# Patient Record
Sex: Female | Born: 1945
Health system: Southern US, Community
[De-identification: ages and names within clinical notes are randomized; demographics above are authoritative.]

## PROBLEM LIST (undated history)

## (undated) DIAGNOSIS — F419 Anxiety disorder, unspecified: Secondary | ICD-10-CM

## (undated) DIAGNOSIS — K589 Irritable bowel syndrome without diarrhea: Secondary | ICD-10-CM

## (undated) DIAGNOSIS — E669 Obesity, unspecified: Secondary | ICD-10-CM

## (undated) DIAGNOSIS — K5792 Diverticulitis of intestine, part unspecified, without perforation or abscess without bleeding: Secondary | ICD-10-CM

## (undated) DIAGNOSIS — R011 Cardiac murmur, unspecified: Secondary | ICD-10-CM

## (undated) DIAGNOSIS — I493 Ventricular premature depolarization: Secondary | ICD-10-CM

## (undated) DIAGNOSIS — E785 Hyperlipidemia, unspecified: Secondary | ICD-10-CM

## (undated) DIAGNOSIS — F329 Major depressive disorder, single episode, unspecified: Secondary | ICD-10-CM

## (undated) DIAGNOSIS — D649 Anemia, unspecified: Secondary | ICD-10-CM

## (undated) DIAGNOSIS — I8500 Esophageal varices without bleeding: Secondary | ICD-10-CM

## (undated) DIAGNOSIS — J189 Pneumonia, unspecified organism: Secondary | ICD-10-CM

## (undated) DIAGNOSIS — Z8669 Personal history of other diseases of the nervous system and sense organs: Secondary | ICD-10-CM

## (undated) DIAGNOSIS — K219 Gastro-esophageal reflux disease without esophagitis: Secondary | ICD-10-CM

## (undated) DIAGNOSIS — C519 Malignant neoplasm of vulva, unspecified: Secondary | ICD-10-CM

## (undated) DIAGNOSIS — H269 Unspecified cataract: Secondary | ICD-10-CM

## (undated) DIAGNOSIS — E119 Type 2 diabetes mellitus without complications: Secondary | ICD-10-CM

## (undated) DIAGNOSIS — K8020 Calculus of gallbladder without cholecystitis without obstruction: Secondary | ICD-10-CM

## (undated) DIAGNOSIS — K7581 Nonalcoholic steatohepatitis (NASH): Secondary | ICD-10-CM

## (undated) DIAGNOSIS — F32A Depression, unspecified: Secondary | ICD-10-CM

## (undated) DIAGNOSIS — I10 Essential (primary) hypertension: Secondary | ICD-10-CM

## (undated) DIAGNOSIS — N3010 Interstitial cystitis (chronic) without hematuria: Secondary | ICD-10-CM

## (undated) HISTORY — DX: Personal history of other diseases of the nervous system and sense organs: Z86.69

## (undated) HISTORY — DX: Obesity, unspecified: E66.9

## (undated) HISTORY — DX: Hyperlipidemia, unspecified: E78.5

## (undated) HISTORY — DX: Diverticulitis of intestine, part unspecified, without perforation or abscess without bleeding: K57.92

## (undated) HISTORY — PX: ABDOMINAL HYSTERECTOMY: SHX81

## (undated) HISTORY — DX: Esophageal varices without bleeding: I85.00

## (undated) HISTORY — DX: Cardiac murmur, unspecified: R01.1

## (undated) HISTORY — PX: CHOLECYSTECTOMY: SHX55

## (undated) HISTORY — DX: Malignant neoplasm of vulva, unspecified: C51.9

## (undated) HISTORY — DX: Pneumonia, unspecified organism: J18.9

## (undated) HISTORY — DX: Interstitial cystitis (chronic) without hematuria: N30.10

## (undated) HISTORY — PX: HERNIA REPAIR: SHX51

## (undated) HISTORY — DX: Ventricular premature depolarization: I49.3

## (undated) HISTORY — DX: Depression, unspecified: F32.A

## (undated) HISTORY — DX: Anxiety disorder, unspecified: F41.9

## (undated) HISTORY — PX: FOOT SURGERY: SHX648

## (undated) HISTORY — DX: Calculus of gallbladder without cholecystitis without obstruction: K80.20

## (undated) HISTORY — DX: Gastro-esophageal reflux disease without esophagitis: K21.9

## (undated) HISTORY — DX: Anemia, unspecified: D64.9

## (undated) HISTORY — DX: Unspecified cataract: H26.9

## (undated) HISTORY — PX: COLONOSCOPY: SHX174

## (undated) HISTORY — DX: Irritable bowel syndrome, unspecified: K58.9

---
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
## 2019-05-13 ENCOUNTER — Encounter (HOSPITAL_COMMUNITY): Payer: Self-pay | Admitting: Emergency Medicine

## 2019-05-13 ENCOUNTER — Observation Stay (HOSPITAL_COMMUNITY): Payer: Medicare Other

## 2019-05-13 ENCOUNTER — Emergency Department (HOSPITAL_COMMUNITY): Payer: Medicare Other

## 2019-05-13 ENCOUNTER — Inpatient Hospital Stay (HOSPITAL_COMMUNITY)
Admission: EM | Admit: 2019-05-13 | Discharge: 2019-05-15 | DRG: 442 | Disposition: A | Discharge status: Home Health | Payer: Medicare Other | Attending: Internal Medicine | Admitting: Internal Medicine

## 2019-05-13 ENCOUNTER — Other Ambulatory Visit: Payer: Self-pay

## 2019-05-13 DIAGNOSIS — Z87891 Personal history of nicotine dependence: Secondary | ICD-10-CM

## 2019-05-13 DIAGNOSIS — E860 Dehydration: Secondary | ICD-10-CM | POA: Diagnosis present

## 2019-05-13 DIAGNOSIS — Z882 Allergy status to sulfonamides status: Secondary | ICD-10-CM

## 2019-05-13 DIAGNOSIS — Z8249 Family history of ischemic heart disease and other diseases of the circulatory system: Secondary | ICD-10-CM

## 2019-05-13 DIAGNOSIS — Z880 Allergy status to penicillin: Secondary | ICD-10-CM

## 2019-05-13 DIAGNOSIS — E785 Hyperlipidemia, unspecified: Secondary | ICD-10-CM | POA: Diagnosis present

## 2019-05-13 DIAGNOSIS — E1151 Type 2 diabetes mellitus with diabetic peripheral angiopathy without gangrene: Secondary | ICD-10-CM | POA: Diagnosis present

## 2019-05-13 DIAGNOSIS — K7290 Hepatic failure, unspecified without coma: Principal | ICD-10-CM | POA: Diagnosis present

## 2019-05-13 DIAGNOSIS — Z20828 Contact with and (suspected) exposure to other viral communicable diseases: Secondary | ICD-10-CM | POA: Diagnosis present

## 2019-05-13 DIAGNOSIS — R531 Weakness: Secondary | ICD-10-CM

## 2019-05-13 DIAGNOSIS — K7581 Nonalcoholic steatohepatitis (NASH): Secondary | ICD-10-CM | POA: Diagnosis present

## 2019-05-13 DIAGNOSIS — E1122 Type 2 diabetes mellitus with diabetic chronic kidney disease: Secondary | ICD-10-CM | POA: Diagnosis present

## 2019-05-13 DIAGNOSIS — K7460 Unspecified cirrhosis of liver: Secondary | ICD-10-CM | POA: Diagnosis present

## 2019-05-13 DIAGNOSIS — Z886 Allergy status to analgesic agent status: Secondary | ICD-10-CM

## 2019-05-13 DIAGNOSIS — R011 Cardiac murmur, unspecified: Secondary | ICD-10-CM | POA: Diagnosis present

## 2019-05-13 DIAGNOSIS — K7682 Hepatic encephalopathy: Secondary | ICD-10-CM

## 2019-05-13 DIAGNOSIS — N179 Acute kidney failure, unspecified: Secondary | ICD-10-CM | POA: Diagnosis present

## 2019-05-13 DIAGNOSIS — K589 Irritable bowel syndrome without diarrhea: Secondary | ICD-10-CM | POA: Diagnosis present

## 2019-05-13 DIAGNOSIS — Z881 Allergy status to other antibiotic agents status: Secondary | ICD-10-CM

## 2019-05-13 DIAGNOSIS — I129 Hypertensive chronic kidney disease with stage 1 through stage 4 chronic kidney disease, or unspecified chronic kidney disease: Secondary | ICD-10-CM | POA: Diagnosis present

## 2019-05-13 DIAGNOSIS — Z833 Family history of diabetes mellitus: Secondary | ICD-10-CM

## 2019-05-13 HISTORY — DX: Type 2 diabetes mellitus without complications: E11.9

## 2019-05-13 HISTORY — DX: Nonalcoholic steatohepatitis (NASH): K75.81

## 2019-05-13 HISTORY — DX: Essential (primary) hypertension: I10

## 2019-05-13 LAB — CBC WITH DIFFERENTIAL/PLATELET
Abs Immature Granulocytes: 0.01 10*3/uL (ref 0.00–0.07)
Basophils Absolute: 0.1 10*3/uL (ref 0.0–0.1)
Basophils Relative: 1 %
Eosinophils Absolute: 0.3 10*3/uL (ref 0.0–0.5)
Eosinophils Relative: 6 %
HCT: 35.3 % — ABNORMAL LOW (ref 36.0–46.0)
Hemoglobin: 11.3 g/dL — ABNORMAL LOW (ref 12.0–15.0)
Immature Granulocytes: 0 %
Lymphocytes Relative: 24 %
Lymphs Abs: 1.1 10*3/uL (ref 0.7–4.0)
MCH: 29 pg (ref 26.0–34.0)
MCHC: 32 g/dL (ref 30.0–36.0)
MCV: 90.7 fL (ref 80.0–100.0)
Monocytes Absolute: 0.8 10*3/uL (ref 0.1–1.0)
Monocytes Relative: 18 %
Neutro Abs: 2.3 10*3/uL (ref 1.7–7.7)
Neutrophils Relative %: 51 %
Platelets: 122 10*3/uL — ABNORMAL LOW (ref 150–400)
RBC: 3.89 MIL/uL (ref 3.87–5.11)
RDW: 17.8 % — ABNORMAL HIGH (ref 11.5–15.5)
WBC: 4.5 10*3/uL (ref 4.0–10.5)
nRBC: 0 % (ref 0.0–0.2)

## 2019-05-13 LAB — URINALYSIS, ROUTINE W REFLEX MICROSCOPIC
Bilirubin Urine: NEGATIVE
Glucose, UA: NEGATIVE mg/dL
Hgb urine dipstick: NEGATIVE
Ketones, ur: NEGATIVE mg/dL
Nitrite: NEGATIVE
Protein, ur: NEGATIVE mg/dL
Specific Gravity, Urine: 1.009 (ref 1.005–1.030)
pH: 6 (ref 5.0–8.0)

## 2019-05-13 LAB — COMPREHENSIVE METABOLIC PANEL
ALT: 33 U/L (ref 0–44)
AST: 58 U/L — ABNORMAL HIGH (ref 15–41)
Albumin: 3.6 g/dL (ref 3.5–5.0)
Alkaline Phosphatase: 102 U/L (ref 38–126)
Anion gap: 13 (ref 5–15)
BUN: 45 mg/dL — ABNORMAL HIGH (ref 8–23)
CO2: 24 mmol/L (ref 22–32)
Calcium: 9.8 mg/dL (ref 8.9–10.3)
Chloride: 104 mmol/L (ref 98–111)
Creatinine, Ser: 1.59 mg/dL — ABNORMAL HIGH (ref 0.44–1.00)
GFR calc Af Amer: 37 mL/min — ABNORMAL LOW (ref >60)
GFR calc non Af Amer: 32 mL/min — ABNORMAL LOW (ref >60)
Glucose, Bld: 112 mg/dL — ABNORMAL HIGH (ref 70–99)
Potassium: 3 mmol/L — ABNORMAL LOW (ref 3.5–5.1)
Sodium: 141 mmol/L (ref 135–145)
Total Bilirubin: 0.8 mg/dL (ref 0.3–1.2)
Total Protein: 7.9 g/dL (ref 6.5–8.1)

## 2019-05-13 LAB — PROTIME-INR
INR: 1.1 (ref 0.8–1.2)
Prothrombin Time: 14.1 seconds (ref 11.4–15.2)

## 2019-05-13 LAB — CBG MONITORING, ED
Glucose-Capillary: 186 mg/dL — ABNORMAL HIGH (ref 70–99)
Glucose-Capillary: 103 mg/dL — ABNORMAL HIGH (ref 70–99)

## 2019-05-13 LAB — GLUCOSE, CAPILLARY
Glucose-Capillary: 194 mg/dL — ABNORMAL HIGH (ref 70–99)
Glucose-Capillary: 111 mg/dL — ABNORMAL HIGH (ref 70–99)

## 2019-05-13 LAB — SARS CORONAVIRUS 2 BY RT PCR (HOSPITAL ORDER, PERFORMED IN ~~LOC~~ HOSPITAL LAB): SARS Coronavirus 2: NEGATIVE

## 2019-05-13 LAB — AMMONIA: Ammonia: 141 umol/L — ABNORMAL HIGH (ref 9–35)

## 2019-05-13 MED ORDER — LACTULOSE 10 GM/15ML PO SOLN
30.0000 g | Freq: Once | ORAL | Status: DC
  Filled 2019-05-13: qty 45

## 2019-05-13 MED ORDER — CLOBETASOL PROPIONATE 0.05 % EX CREA
TOPICAL_CREAM | Freq: Two times a day (BID) | CUTANEOUS | Status: DC
  Administered 2019-05-13: 23:00:00 via TOPICAL
  Administered 2019-05-14: 1 via TOPICAL
  Administered 2019-05-14 – 2019-05-15 (×2): via TOPICAL
  Filled 2019-05-13: qty 15

## 2019-05-13 MED ORDER — INSULIN ASPART 100 UNIT/ML ~~LOC~~ SOLN: 0.0000 [IU] | Freq: Every day | SUBCUTANEOUS | Status: DC

## 2019-05-13 MED ORDER — LACTULOSE 10 GM/15ML PO SOLN
20.0000 g | Freq: Once | ORAL | Status: AC
  Administered 2019-05-13: 20 g via ORAL
  Filled 2019-05-13 (×2): qty 30

## 2019-05-13 MED ORDER — POTASSIUM CHLORIDE CRYS ER 20 MEQ PO TBCR
40.0000 meq | EXTENDED_RELEASE_TABLET | Freq: Once | ORAL | Status: AC
  Administered 2019-05-13: 40 meq via ORAL
  Filled 2019-05-13: qty 2

## 2019-05-13 MED ORDER — SODIUM CHLORIDE 0.9% FLUSH
3.0000 mL | Freq: Two times a day (BID) | INTRAVENOUS | Status: DC
  Administered 2019-05-13 – 2019-05-15 (×4): 3 mL via INTRAVENOUS

## 2019-05-13 MED ORDER — ENOXAPARIN SODIUM 40 MG/0.4ML ~~LOC~~ SOLN
40.0000 mg | SUBCUTANEOUS | Status: DC
  Administered 2019-05-13 – 2019-05-14 (×2): 40 mg via SUBCUTANEOUS
  Filled 2019-05-13 (×2): qty 0.4

## 2019-05-13 MED ORDER — INSULIN ASPART 100 UNIT/ML ~~LOC~~ SOLN
0.0000 [IU] | Freq: Three times a day (TID) | SUBCUTANEOUS | Status: DC
  Administered 2019-05-13: 3 [IU] via SUBCUTANEOUS
  Administered 2019-05-14: 8 [IU] via SUBCUTANEOUS
  Administered 2019-05-15: 12:00:00 3 [IU] via SUBCUTANEOUS

## 2019-05-13 MED ORDER — SODIUM CHLORIDE 0.9 % IV BOLUS
1000.0000 mL | Freq: Once | INTRAVENOUS | Status: AC
  Administered 2019-05-13: 1000 mL via INTRAVENOUS

## 2019-05-13 NOTE — ED Notes (Signed)
Patient transported to [facility] 

## 2019-05-13 NOTE — ED Triage Notes (Signed)
Pt c/o unable to get out of bed this am, "legs wouldn't work right" states has been having shaky episodes and thinks that her blood sugar is low,  CBG on arrival = 186, also c/o having a difficult time with thoughts and memory--

## 2019-05-13 NOTE — H&P (Signed)
Date: 05/13/2019               Patient Name:  Sheila [name] MRN: [id number]  DOB: 29-Apr-1946 Age / Sex: 73 y.o., female   PCP: Chesley Noon, MD         Medical Service: Internal Medicine Teaching Service         Attending Physician: Dr. Sid Falcon, MD    First Contact: Dr. Madilyn Fireman  Pager: [phone]  Second Contact: Dr. Sherry Ruffing Pager: [phone]       After Hours (After 5p/  First Contact Pager: [phone]  weekends / holidays): Second Contact Pager: [phone]   Chief Complaint: " Cannot get out of bed"  History of Present Illness:  Sheila [name] is a 73 year old female with a medical history of NASH cirrhosis diagnosed by biopsy in 2006, HTN, HLD, T2DM, and obesity who presented to the ED for evaluation of diffuse weakness. She had been doing overall well until yesterday when she had an episode of dizziness while she was driving home.  She describes this as feeling woozy and is worse when standing up.  Denies room spinning or feeling of passing out or imbalance.  No auditory symptoms at the time.  She thought she was hypoglycemic and checked her BG when she arrived home which was 89. Does not check her BG regularly is not sure if this is normal for her. She takes PO medications for DM, no insulin use.  Reports her a last A1c was 5.9.  This morning she felt very weak when she woke up and was not able to get out of bed by herself.  She also reports feeling foggy and having difficulty thinking.  She denies addition of new medications recently but did start taking lasix 40 mg QD 1.5 weeks ago for ankle swelling that is thought to be secondary to amlodipine use.This prescribed as a PRN. She denies headaches, changes in vision, fever, chills, chest pain, shortness of breath, cough, abdominal pain, nausea and vomiting, urinary symptoms.  She does have a history of IBS and usually has diarrhea.  States this has been well controlled recently and she is only been having 1 BM per day.  ED course:  Patient was afebrile and hemodynamically stable on arrival to the ED.  Blood work was remarkable for Hgb 11.3, platelets 122, Na 141, K3.0, BUN 112, Cr 1.59 (baseline 1.1-1.3), AST 58, ALT 33, alk phos 102, TB 0.8, INR 1.1.  COVID pending.  Chest x-ray did not reveal any acute abnormalities.  CT head showed mild to moderate for age cerebral white matter hypodensity most commonly due to small vessel disease, but no acute intracranial normalities.   Meds: This medication list was obtained from the care everywhere tab and has not been verified by pharmacy yet. Abilify 10 mg nightly Remodel 0.5-0.025 mg tablets 4 times daily Zetia 10 mg daily Lasix 20 mg daily Gabapentin 200 daily Omeprazole 40 daily Janumet [phone] mg daily Amlodipine 10 mg daily Prozac 40 mg daily Metoprolol 25 mg daily Telmisartan-HCTZ 80-25 mg daily   Allergies: Allergies as of 05/13/2019 - Review Complete 05/13/2019  Allergen Reaction Noted  . Acetaminophen  09/23/2013  . Cefazolin Other (See Comments) 01/14/2015  . Ciprofloxacin hcl Nausea Only 09/28/2010  . Ibuprofen  09/23/2013  . Sulfa antibiotics  01/06/2014  . Naproxen sodium  12/28/2010  . Amoxicillin Diarrhea 10/12/2014   Past Medical History:  Diagnosis Date  . Diabetes mellitus without complication ([place])   .  Hypertension   . NASH (nonalcoholic steatohepatitis)     Family History:  HTN and DM- mother and father   Social History: Patient recently moved to [place] from [place].  She lives with her husband of 12 years.  Able to complete ADLs at home.  She denies alcohol and illicit substance use.  She is a former smoker with a 15-pack-year history.  Quit smoking 12 years ago.  Review of Systems: A complete ROS was negative except as per HPI.   Physical Exam: Blood pressure (!) 123/55, pulse (!) 57, temperature 98.7 F (37.1 C), temperature source Oral, resp. rate 18, height '5\' 6"'$  (1.676 m), weight 79.8 kg, SpO2 95 %.  General: Well-appearing  female in no acute distress HENT: NCAT, neck supple and FROM, OP clear without exudates or erythema, dry mucous membranes Eyes: anicteric sclera, PERRL Cardiac: regular rate and rhythm, nl F0/Y7, II/VI systolic murmur best heard on right sternal border, no JVD Pulm: CTAB, no wheezes or crackles, no increased work of breathing  Abd: soft, NTND, hyperactive bowel sounds Neuro: A&Ox3, CN II-XII intact, sensation intact in all four extremities, no motor deficits, nl finger-to-nose testing, nl heel-to shin  Ext: warm and well perfused with 1+ pitting edema bilaterally, asterixis present  Derm: Maculopapular erythematous rash with telangiectasias over bilateral arms, chest, back, and neck Psych: attentive, appropriate affect, answers questions appropriately    EKG: personally reviewed my interpretation is normal sinus rhythm, Q waves on leads III, aVF, and V4-V6. TWI on lead III.   CXR: personally reviewed my interpretation is no opacities or effusions   Assessment & Plan by Problem: Active Problems:   Weakness   # Weakness: Patient's presents with a variety of symptoms including dizziness, slow thought process, and diffuse weakness that I suspect are multifactorial in nature. Mild hepatic encephalopathy with slow thought process, asterixis, and high ammonia level.  She has also been using Lasix on a daily basis for the past 1.5 weeks and appears dehydrated on exam. In addition, she could also be experiencing relative hypoglycemia, though unclear where her CBGs are at baseline as she does not check them at home. CVA has been ruled out and low suspicion for infection as she is afebrile, without leukocytosis and without infectious signs or symtpoms. - Orthostatic VS followed by 1L NS bolus  - Lactulose 20 mg once  - PT/OT   # NASH cirrhosis: Diagnosed by biopsy on 2006 per patient report. Last RUq [place] was in 2016 which showed mildly nodular liver. Not on any medications for cirrhosis.  She does not  follow-up with GI.  She denies previous history of HE or SBP.  - RUQ [place]  - Hold Lasix in the setting of dehydration  - Will need outpatient referral to GI  # AKI: Baseline renal function appears to be Cr 1.1-1.3. Currently at 1.6. I suspect this is pre-renal due to dehydration as she appears hypovolemic on exam.  Will give 1 L NS bolus.  BMP tomorrow AM.   # IBS: Will hold Lomotil for now.   # HTN: Holding all antihypertensive medications. # HLD: Continue home Zetia 10 mg QD.   # T2DM: On Janumet at home.  Will hold and place on moderate sliding scale with at bedtime scale. Consider discontinuing metformin on discharge due to history of cirrhosis.    Diet: Low Na IVF: 1L NS  VTE ppx: SQ enoxaparin  Code status: FULL code, confirmed on admission   Dispo: Admit patient to Observation with expected  length of stay less than 2 midnights.  SignedWelford Roche, MD 05/13/2019, 11:47 AM  Pager: [phone]

## 2019-05-13 NOTE — Progress Notes (Signed)
Sheila [name] [id number] Admission Data: 05/13/2019 4:21 PM Attending Provider: Sid Falcon, MD  [id number], Rebeca Alert, MD Consults/ Treatment Team:   Sheila [name] is a 73 y.o. female patient admitted from ED awake, alert  & orientated  X 3,  Full Code, VSS - Blood pressure (!) 141/61, pulse 63, temperature 98.6 F (37 C), temperature source Oral, resp. rate 20, height 5\' 6"  (1.676 m), weight 82.5 kg, SpO2 95 %., no c/o shortness of breath, no c/o chest pain, no distress noted.IV site WDL:  forearm left, condition patent and no redness with a transparent dsg that's clean dry and intact.  Allergies:   Allergies  Allergen Reactions  . Acetaminophen     Other reaction(s): Other Patient avoids Tylenol due to fatty liver disease  . Cefazolin Other (See Comments)    Other reaction(s): Other caused C-DIF caused C-DIF c dif caused C-DIF   . Ciprofloxacin Hcl Nausea Only  . Ibuprofen     Other reaction(s): Other Diabetic--Concerns about kidneys  . Sulfa Antibiotics     Other reaction(s): Hives  . Naproxen Sodium     Causes pain in veins  . Amoxicillin Diarrhea    Other reaction(s): Diarrhea     Past Medical History:  Diagnosis Date  . Diabetes mellitus without complication ([place])   . Hypertension   . NASH (nonalcoholic steatohepatitis)     History:  obtained from chart review. Tobacco/alcohol: unknown tobacco use history unreliable  Pt orientation to unit, room and routine. Information packet given to patient/family and safety video watched.  Admission INP armband ID verified with patient/family, and in place. SR up x 2, fall risk assessment complete with Patient and family verbalizing understanding of risks associated with falls. Pt verbalizes an understanding of how to use the call bell and to call for help before getting out of bed.  Skin, clean-dry- intact without evidence of bruising, or skin tears.   No evidence of skin break down noted on exam. no petechiae, no nodules,  no jaundice, no purpura, no wounds Rash on mid lower back, (states it is eczema), bruising both AC from blood draws    Will cont to monitor and assist as needed. Cathlean Marseilles Tamala Julian, MSN, RN, [place], [place]  05/13/2019 4:21 PM

## 2019-05-13 NOTE — ED Provider Notes (Signed)
[facility] EMERGENCY DEPARTMENT Provider Note   CSN: [id number] Arrival date & time: 05/13/19  0703     History   Chief Complaint Chief Complaint  Patient presents with   Weakness    HPI Sheila [name] is a 73 y.o. female.     HPI Patient presents with she has been driving a few days ago and felt as though she was shaking.  States she was close to home and pulled over.  States she is worried her sugar could have gone down but did not check her sugar.  States this morning she woke up and could not get out of bed because her legs felt weak.  It was both legs.  States she also has a little bit of shaking in her hands at x2.  Did not check her sugar at that time either.  Upon arrival her sugar was 186.  Has had some mild headaches at times.  States she is felt confused.  Has a history of [name].  States that she also had been on a diet and has lost around 15 pounds.  No chest pain.  No trouble breathing.  No recent change in medicines. Past Medical History:  Diagnosis Date   Diabetes mellitus without complication ([place])    Hypertension    NASH (nonalcoholic steatohepatitis)     There are no active problems to display for this patient.   Past Surgical History:  Procedure Laterality Date   ABDOMINAL HYSTERECTOMY     CHOLECYSTECTOMY     HERNIA REPAIR       OB History   No obstetric history on file.      Home Medications    Prior to Admission medications   Not on File    Family History No family history on file.  Social History Social History   Tobacco Use   Smoking status: Never Smoker   Smokeless tobacco: Never Used  Substance Use Topics   Alcohol use: Never    Frequency: Never    Comment: rarely   Drug use: Never     Allergies   Patient has no allergy information on record.   Review of Systems Review of Systems  Constitutional: Negative for appetite change.  HENT: Negative for congestion.   Respiratory: Negative for  shortness of breath.   Cardiovascular: Negative for chest pain.  Gastrointestinal: Negative for abdominal distention.  Genitourinary: Negative for flank pain.  Musculoskeletal: Negative for back pain.  Skin: Positive for rash.  Neurological: Positive for weakness and headaches.  Psychiatric/Behavioral: Positive for confusion.     Physical Exam Updated Vital Signs BP (!) 123/55    Pulse (!) 57    Temp 98.7 F (37.1 C) (Oral)    Resp 18    Ht 5' 6"  (1.676 m)    Wt 79.8 kg    SpO2 95%    BMI 28.41 kg/m   Physical Exam Vitals signs and nursing note reviewed.  HENT:     Head: Normocephalic.  Eyes:     General: No scleral icterus.    Extraocular Movements: Extraocular movements intact.  Neck:     Musculoskeletal: Neck supple.  Cardiovascular:     Rate and Rhythm: Normal rate and regular rhythm.  Pulmonary:     Effort: Pulmonary effort is normal.  Abdominal:     Tenderness: There is no abdominal tenderness.  Musculoskeletal: Normal range of motion.  Skin:    Comments: Patient with multiple telangiectasias.  Neurological:     Mental  Status: She is alert and oriented to person, place, and time.     Comments: Patient is awake and appropriate.  Minimal tremor in hands.  Good strength bilateral lower extremities      ED Treatments / Results  Labs (all labs ordered are listed, but only abnormal results are displayed) Labs Reviewed  AMMONIA - Abnormal; Notable for the following components:      Result Value   Ammonia 141 (*)    All other components within normal limits  CBC WITH DIFFERENTIAL/PLATELET - Abnormal; Notable for the following components:   Hemoglobin 11.3 (*)    HCT 35.3 (*)    RDW 17.8 (*)    Platelets 122 (*)    All other components within normal limits  COMPREHENSIVE METABOLIC PANEL - Abnormal; Notable for the following components:   Potassium 3.0 (*)    Glucose, Bld 112 (*)    BUN 45 (*)    Creatinine, Ser 1.59 (*)    AST 58 (*)    GFR calc non Af Amer  32 (*)    GFR calc Af Amer 37 (*)    All other components within normal limits  CBG MONITORING, ED - Abnormal; Notable for the following components:   Glucose-Capillary 186 (*)    All other components within normal limits  SARS CORONAVIRUS 2 (HOSPITAL ORDER, [facility])  PROTIME-INR  URINALYSIS, ROUTINE W REFLEX MICROSCOPIC    EKG EKG Interpretation  Date/Time:  Tuesday May 13 2019 08:51:44 EDT Ventricular Rate:  60 PR Interval:    QRS Duration: 118 QT Interval:  485 QTC Calculation: 485 R Axis:   -7 Text Interpretation:  Sinus rhythm Nonspecific intraventricular conduction delay Inferior infarct, old No old tracing to compare Confirmed by Davonna Belling [phone]) on 05/13/2019 9:04:09 AM   Radiology Ct Head Wo Contrast  Result Date: 05/13/2019 CLINICAL DATA:  73 year old female with altered mental status and weakness this morning. EXAM: CT HEAD WITHOUT CONTRAST TECHNIQUE: Contiguous axial images were obtained from the base of the skull through the vertex without intravenous contrast. COMPARISON:  None. FINDINGS: Brain: No midline shift, ventriculomegaly, mass effect, evidence of mass lesion, intracranial hemorrhage or evidence of cortically based acute infarction. Mild to moderate for age patchy bilateral cerebral white matter hypodensity. No cortical encephalomalacia. There does seem to be disproportionate biparietal cerebral volume loss (series 3, image 22). Vascular: Calcified atherosclerosis at the skull base. No suspicious intracranial vascular hyperdensity. Skull: Symmetric thinning of bone over the parietal convexities but No acute osseous abnormality identified. Sinuses/Orbits: Visualized paranasal sinuses and mastoids are clear. Other: Postoperative changes to the globes. Otherwise negative orbits and scalp. IMPRESSION: 1.  No acute intracranial abnormality identified. 2. Mild to moderate for age cerebral white matter hypodensity, most commonly due  to small vessel disease. 3. Suggestion of disproportionate biparietal cerebral volume loss, nonspecific but can be seen with dementia. Electronically Signed   By: Genevie Ann M.D.   On: 05/13/2019 08:32   Dg Chest Portable 1 View  Result Date: 05/13/2019 CLINICAL DATA:  Altered mental status and elevated blood sugar EXAM: PORTABLE CHEST 1 VIEW COMPARISON:  None. FINDINGS: Cardiac shadow is at the upper limits of normal in size. Aortic calcifications are noted. The lungs are well aerated without focal infiltrate or sizable effusion. Bony noted IMPRESSION: No acute abnormality noted. Electronically Signed   By: Inez Catalina M.D.   On: 05/13/2019 08:59    Procedures Procedures (including critical care time)  Medications Ordered in  ED Medications - No data to display   Initial Impression / Assessment and Plan / ED Course  I have reviewed the triage vital signs and the nursing notes.  Pertinent labs & imaging results that were available during my care of the patient were reviewed by me and considered in my medical decision making (see chart for details).        Patient presents with episodes of shaking in her hands.  States her legs 1 or quite is been confused over the last few days.  Has a history of [name] although does not see a gastroenterologist or hepatologist for it.  Her ammonia is 141.  Creatinine is mildly increased at around 1.6.  Looks like she has had episodes of it going up in the past her baseline appears to be normal.  With worsening mental status and elevated ammonia will admit to unassigned internal medicine for further work-up and treatment.  Final Clinical Impressions(s) / ED Diagnoses   Final diagnoses:  Hepatic encephalopathy ([place])  NASH (nonalcoholic steatohepatitis)    ED Discharge Orders    None       Davonna Belling, MD 05/13/19 1039

## 2019-05-13 NOTE — ED Notes (Signed)
Lunch Tray Ordered @ 1154-per RN called by Levada Dy

## 2019-05-14 ENCOUNTER — Ambulatory Visit (HOSPITAL_BASED_OUTPATIENT_CLINIC_OR_DEPARTMENT_OTHER): Payer: Medicare Other

## 2019-05-14 DIAGNOSIS — E1151 Type 2 diabetes mellitus with diabetic peripheral angiopathy without gangrene: Secondary | ICD-10-CM | POA: Diagnosis present

## 2019-05-14 DIAGNOSIS — E1122 Type 2 diabetes mellitus with diabetic chronic kidney disease: Secondary | ICD-10-CM

## 2019-05-14 DIAGNOSIS — I129 Hypertensive chronic kidney disease with stage 1 through stage 4 chronic kidney disease, or unspecified chronic kidney disease: Secondary | ICD-10-CM | POA: Diagnosis present

## 2019-05-14 DIAGNOSIS — Z20828 Contact with and (suspected) exposure to other viral communicable diseases: Secondary | ICD-10-CM | POA: Diagnosis present

## 2019-05-14 DIAGNOSIS — R531 Weakness: Secondary | ICD-10-CM

## 2019-05-14 DIAGNOSIS — Z886 Allergy status to analgesic agent status: Secondary | ICD-10-CM | POA: Diagnosis not present

## 2019-05-14 DIAGNOSIS — Z882 Allergy status to sulfonamides status: Secondary | ICD-10-CM | POA: Diagnosis not present

## 2019-05-14 DIAGNOSIS — E785 Hyperlipidemia, unspecified: Secondary | ICD-10-CM

## 2019-05-14 DIAGNOSIS — E860 Dehydration: Secondary | ICD-10-CM | POA: Diagnosis present

## 2019-05-14 DIAGNOSIS — I10 Essential (primary) hypertension: Secondary | ICD-10-CM | POA: Diagnosis not present

## 2019-05-14 DIAGNOSIS — R011 Cardiac murmur, unspecified: Secondary | ICD-10-CM | POA: Diagnosis present

## 2019-05-14 DIAGNOSIS — N179 Acute kidney failure, unspecified: Secondary | ICD-10-CM

## 2019-05-14 DIAGNOSIS — K713 Toxic liver disease with chronic persistent hepatitis: Secondary | ICD-10-CM | POA: Diagnosis not present

## 2019-05-14 DIAGNOSIS — R410 Disorientation, unspecified: Secondary | ICD-10-CM | POA: Diagnosis not present

## 2019-05-14 DIAGNOSIS — N189 Chronic kidney disease, unspecified: Secondary | ICD-10-CM

## 2019-05-14 DIAGNOSIS — K589 Irritable bowel syndrome without diarrhea: Secondary | ICD-10-CM

## 2019-05-14 DIAGNOSIS — K7581 Nonalcoholic steatohepatitis (NASH): Secondary | ICD-10-CM

## 2019-05-14 DIAGNOSIS — Z79899 Other long term (current) drug therapy: Secondary | ICD-10-CM

## 2019-05-14 DIAGNOSIS — K7290 Hepatic failure, unspecified without coma: Secondary | ICD-10-CM | POA: Diagnosis present

## 2019-05-14 DIAGNOSIS — Z833 Family history of diabetes mellitus: Secondary | ICD-10-CM | POA: Diagnosis not present

## 2019-05-14 DIAGNOSIS — K7460 Unspecified cirrhosis of liver: Secondary | ICD-10-CM

## 2019-05-14 DIAGNOSIS — E669 Obesity, unspecified: Secondary | ICD-10-CM

## 2019-05-14 DIAGNOSIS — Z880 Allergy status to penicillin: Secondary | ICD-10-CM | POA: Diagnosis not present

## 2019-05-14 DIAGNOSIS — Z8249 Family history of ischemic heart disease and other diseases of the circulatory system: Secondary | ICD-10-CM | POA: Diagnosis not present

## 2019-05-14 DIAGNOSIS — K7682 Hepatic encephalopathy: Secondary | ICD-10-CM

## 2019-05-14 DIAGNOSIS — Z87891 Personal history of nicotine dependence: Secondary | ICD-10-CM | POA: Diagnosis not present

## 2019-05-14 DIAGNOSIS — E876 Hypokalemia: Secondary | ICD-10-CM

## 2019-05-14 DIAGNOSIS — Z881 Allergy status to other antibiotic agents status: Secondary | ICD-10-CM | POA: Diagnosis not present

## 2019-05-14 LAB — GLUCOSE, CAPILLARY
Glucose-Capillary: 90 mg/dL (ref 70–99)
Glucose-Capillary: 253 mg/dL — ABNORMAL HIGH (ref 70–99)
Glucose-Capillary: 83 mg/dL (ref 70–99)
Glucose-Capillary: 113 mg/dL — ABNORMAL HIGH (ref 70–99)

## 2019-05-14 LAB — ECHOCARDIOGRAM COMPLETE
Height: 66 in
Weight: 2910.07 oz

## 2019-05-14 LAB — CBC
HCT: 31 % — ABNORMAL LOW (ref 36.0–46.0)
Hemoglobin: 10 g/dL — ABNORMAL LOW (ref 12.0–15.0)
MCH: 29.8 pg (ref 26.0–34.0)
MCHC: 32.3 g/dL (ref 30.0–36.0)
MCV: 92.3 fL (ref 80.0–100.0)
Platelets: 152 10*3/uL (ref 150–400)
RBC: 3.36 MIL/uL — ABNORMAL LOW (ref 3.87–5.11)
RDW: 18 % — ABNORMAL HIGH (ref 11.5–15.5)
WBC: 4.2 10*3/uL (ref 4.0–10.5)
nRBC: 0 % (ref 0.0–0.2)

## 2019-05-14 LAB — BASIC METABOLIC PANEL
Anion gap: 9 (ref 5–15)
BUN: 32 mg/dL — ABNORMAL HIGH (ref 8–23)
CO2: 24 mmol/L (ref 22–32)
Calcium: 9.2 mg/dL (ref 8.9–10.3)
Chloride: 109 mmol/L (ref 98–111)
Creatinine, Ser: 1.31 mg/dL — ABNORMAL HIGH (ref 0.44–1.00)
GFR calc Af Amer: 46 mL/min — ABNORMAL LOW (ref >60)
GFR calc non Af Amer: 40 mL/min — ABNORMAL LOW (ref >60)
Glucose, Bld: 101 mg/dL — ABNORMAL HIGH (ref 70–99)
Potassium: 3.3 mmol/L — ABNORMAL LOW (ref 3.5–5.1)
Sodium: 142 mmol/L (ref 135–145)

## 2019-05-14 LAB — HEMOGLOBIN A1C
Hgb A1c MFr Bld: 5.6 % (ref 4.8–5.6)
Mean Plasma Glucose: 114 mg/dL

## 2019-05-14 MED ORDER — POTASSIUM CHLORIDE CRYS ER 20 MEQ PO TBCR
40.0000 meq | EXTENDED_RELEASE_TABLET | Freq: Once | ORAL | Status: AC
  Administered 2019-05-14: 40 meq via ORAL
  Filled 2019-05-14: qty 2

## 2019-05-14 MED ORDER — LIP MEDEX EX OINT
1.0000 "application " | TOPICAL_OINTMENT | CUTANEOUS | Status: DC | PRN
  Filled 2019-05-14: qty 7

## 2019-05-14 MED ORDER — LACTULOSE 10 GM/15ML PO SOLN: 20.0000 g | Freq: Two times a day (BID) | ORAL | Status: DC | PRN

## 2019-05-14 NOTE — Progress Notes (Signed)
Patient becoming more forgetful during the night.  Patient pleasantly confused, but redirectable.  Patient unable to perform sequential tasks, such as how to take cup from bedside table, lift to lips then drink without prompting.  Patient also observed sitting in bed holding personal phone, unable to recall what tasks she was about to do.  Patient verbalized wanting to call son, patient informed that hour was after 130AM, patient stated "oh... probably not a good idea to call" then placed phone down in bed.  Phone remained on table for less than three minutes before she reached for phone again to call her son.  Patient unable to recall that she had recently attempted to call her son.  Patient currently resting quietly in bed.

## 2019-05-14 NOTE — Evaluation (Signed)
Occupational Therapy Evaluation Patient Details Name: Sheila [name] MRN: [id number] DOB: [birth year] Today's Date: 05/14/2019    History of Present Illness Sheila [name] is a 73 year old female with a medical history of NASH cirrhosis diagnosed by biopsy in 2006, HTN, HLD, T2DM, and obesity who presented to the ED for evaluation of diffuse weakness. CT head: no acute abnormality.   Clinical Impression   Pt admitted with above dx. Pt was independent in her ILF caring for spouse with dementia. Pt currently with functional limitiations due to the deficits in poor activity tolerance and decreased mobility. Pt will benefit from skilled OT to increase their independence and safety with adls and balance to allow discharge with HHOT OT following acutely.     Follow Up Recommendations  Home health OT;Supervision/Assistance - 24 hour    Equipment Recommendations  None recommended by OT    Recommendations for Other Services       Precautions / Restrictions Precautions Precautions: Fall Restrictions Weight Bearing Restrictions: No      Mobility Bed Mobility Overal bed mobility: Modified Independent             General bed mobility comments: did not assess this session, pt seated in recliner upon PT arrival  Transfers Overall transfer level: Needs assistance Equipment used: None Transfers: Sit to/from Stand Sit to Stand: Min guard         General transfer comment: minguardA for stability, pt reaching out for furniture- used RW headed to recliner with increased balance with AD    Balance Overall balance assessment: Needs assistance Sitting-balance support: Feet supported Sitting balance-Leahy Scale: Normal     Standing balance support: No upper extremity supported Standing balance-Leahy Scale: Poor Standing balance comment: better with RW     Tandem Stance - Right Leg: 8   Rhomberg - Eyes Opened: 30 Rh[place]berg - Eyes Closed: 18               ADL either performed  or assessed with clinical judgement   ADL Overall ADL's : At baseline                                       General ADL Comments: Pt requires supervisionA for stability in standing for ADL. Pt lethargic, but had not gotten a lot of sleep.     Vision Baseline Vision/History: Wears glasses Wears Glasses: At all times Patient Visual Report: No change from baseline Vision Assessment?: Yes Eye Alignment: Within Functional Limits Ocular Range of Motion: Within Functional Limits Alignment/Gaze Preference: Within Defined Limits Tracking/Visual Pursuits: Able to track stimulus in all quads without difficulty     Perception     Praxis      Pertinent Vitals/Pain Pain Assessment: No/denies pain     Hand Dominance Right   Extremity/Trunk Assessment Upper Extremity Assessment Upper Extremity Assessment: Generalized weakness   Lower Extremity Assessment Lower Extremity Assessment: Generalized weakness   Cervical / Trunk Assessment Cervical / Trunk Assessment: Normal   Communication Communication Communication: Expressive difficulties   Cognition Arousal/Alertness: Awake/alert Behavior During Therapy: WFL for tasks assessed/performed Overall Cognitive Status: Within Functional Limits for tasks assessed                                 General Comments: difficulty with dual-task (unable to talk while walking, frequent stops during ambulation to answer questions)  General Comments  Pt stating she won't use RW at her ILF    Exercises     Shoulder Instructions      Home Living Family/patient expects to be discharged to:: Other (Comment)(ILF) Living Arrangements: Spouse/significant other Available Help at Discharge: Family;Available PRN/intermittently(son lives out of town;spouse has dementia) Type of Home: Independent living facility Home Access: Level entry     Home Layout: One level     Bathroom Shower/Tub: Radiographer, therapeutic: Handicapped height     Home Equipment: None          Prior Functioning/Environment Level of Independence: Independent        Comments: Assisting spouse as needed as he has dementia        OT Problem List: Decreased strength;Decreased activity tolerance;Impaired balance (sitting and/or standing)      OT Treatment/Interventions: Self-care/ADL training;Therapeutic exercise;Neuromuscular education;Energy conservation;DME and/or AE instruction;Therapeutic activities;Patient/family education;Balance training    OT Goals(Current goals can be found in the care plan section) Acute Rehab OT Goals Patient Stated Goal: go home OT Goal Formulation: With patient Time For Goal Achievement: 05/28/19 Potential to Achieve Goals: Good ADL Goals Pt Will Perform Grooming: with modified independence;standing Pt Will Perform Lower Body Dressing: with modified independence;sit to/from stand Pt Will Perform Toileting - Clothing Manipulation and hygiene: with modified independence;sit to/from stand Additional ADL Goal #1: Pt will perform OOB ADL with modified independence and fair balance with least restrictive device  OT Frequency: Min 2X/week   Barriers to D/C: Decreased caregiver support  spouse has dementia       Co-evaluation              AM-PAC OT "6 Clicks" Daily Activity     Outcome Measure Help from another person eating meals?: None Help from another person taking care of personal grooming?: None Help from another person toileting, which includes using toliet, bedpan, or urinal?: A Little Help from another person bathing (including washing, rinsing, drying)?: A Little Help from another person to put on and taking off regular upper body clothing?: None Help from another person to put on and taking off regular lower body clothing?: A Little 6 Click Score: 21   End of Session Equipment Utilized During Treatment: Gait belt;Rolling walker Nurse Communication: Mobility  status  Activity Tolerance: Patient tolerated treatment well Patient left: in chair;with call bell/phone within reach;with chair alarm set  OT Visit Diagnosis: Unsteadiness on feet (R26.81);Muscle weakness (generalized) (M62.81)                Time: [id number] OT Time Calculation (min): 33 min Charges:  OT General Charges $OT Visit: 1 Visit OT Evaluation $OT Eval Moderate Complexity: 1 Mod OT Treatments $Self Care/Home Management : 8-22 mins  Ebony Hail Harold Hedge) Marsa Aris OTR/L Acute Rehabilitation Services Pager: [phone] Office: [place] 05/14/2019, 4:31 PM

## 2019-05-14 NOTE — Progress Notes (Signed)
  Date: 05/14/2019  Patient name: Sheila [name]  Medical record number: [id number]  Date of birth: 01-10-1946   I have seen and evaluated Sheila [name] and discussed their care with the Residency Team. Briefly, Sheila [name] is a 73 year old woman with PMH of cirrhosis 2/2 NASH, HTN, HLD, DM2 who presented for weakness, confusion and feeling woozy.  She had an elevated ammonia level as well.  She had slowed speech and delayed recall along with asterixis on admission which led to the diagnosis of likely hepatic encephalopathy.  LFTs showed a mild increase in AST.  She had an elevated BUN and Cr at 45 and 1.59.  She reported that she had been taking lasix daily for about 1.5 weeks due to LE edema.  She also presented with a low potassium.  CT head showed no acute abnormality.  She had some mild leukosuria but no symptoms of a UTI.  SARs CoV was negative.  Abd US showed anticipated cirrhotic changes to the liver.  WBC was 4.5.  INR was 1.1.  MELD score was 12.  Child class is A giving a life expectancy of 39 - 20 years related to cirrhosis.  She reports occasional lightheadedness with walking, no chest pain.   Vitals:   05/14/19 0115 05/14/19 0527  BP: 124/62 (!) 148/64  Pulse: 72 79  Resp: 20 18  Temp: 98.2 F (36.8 C) 98.7 F (37.1 C)  SpO2: 98% 97%   General: Awake, alert, oriented but slow to respond Eyes: Anicteric sclerae HENT: neck is supple CV: 9-4/0 systolic murmur, radiates to the carotid arteries, RR, NR, pulses 2+ in the radial and DP arteries bilaterally Pulm: Breathing comfortably, no wheezing Abd: Soft, +BS, no apparent ascites, no distention  MSK: Decreased muscle tone, mild asterixis Skin: Palmar erythema, spider telangectasias on the chest Psych: Pleasant, mildly confused  Assessment and Plan: I have seen and evaluated the patient as outlined above. I agree with the formulated Assessment and Plan as detailed in the residents' note, with the following changes:   1. Confusion,  most likely caused by hepatic encephalopathy grade I-II - Agree with lactulose dosing, she has concomitant IBS and is on medication for diarrhea which will complicate treatment - Consider starting Rifaximin once she is improved - Hold lasix  2. Dehydration, mild AKI on CKD - Last Cr in Care everywhere was 1.3 - 1.59 on admission - BUN/Cr ratio > 20 - IVF with 1L NS - Avoid further fluids given cirrhosis and holding lasix - Hold lasix - Encourage PO intake  3. Weakness - PT and OT evaluation  Other issues per Dr. Webb Silversmith daily note.  Sid Falcon, MD 9/9/202010:55 AM

## 2019-05-14 NOTE — Plan of Care (Signed)

## 2019-05-14 NOTE — Progress Notes (Signed)
Echocardiogram 2D Echocardiogram has been performed.  Oneal Deputy Ritesh Opara 05/14/2019, 10:37 AM

## 2019-05-14 NOTE — Plan of Care (Signed)

## 2019-05-14 NOTE — Progress Notes (Signed)
Subjective: Patient laying in bed during rounds today. She reports feeling well. She states she feels somewhat less confused today. She said she had a BM yesterday and understands she will need to have a few more for his sx to improve. She has no other complaints or concerns.  Objective:  Vital signs in last 24 hours: Vitals:   05/13/19 1519 05/13/19 2245 05/14/19 0115 05/14/19 0527  BP: (!) 141/61 (!) 130/45 124/62 (!) 148/64  Pulse: 63 70 72 79  Resp: 20 20 20 18   Temp: 98.6 F (37 C) 98.2 F (36.8 C) 98.2 F (36.8 C) 98.7 F (37.1 C)  TempSrc: Oral Oral Oral   SpO2: 95% 96% 98% 97%  Weight:      Height:       Physical Exam  Constitutional: She is oriented to person, place, and time and well-developed, well-nourished, and in no distress. No distress.  HENT:  Head: Normocephalic and atraumatic.  Cardiovascular: Normal rate and regular rhythm.  Murmur heard. 4/6 systolic murmur that radiates to the carotids  Pulmonary/Chest: Effort normal and breath sounds normal. No respiratory distress.  Abdominal: Soft. Bowel sounds are normal. She exhibits no distension. There is no abdominal tenderness.  Neurological: She is alert and oriented to person, place, and time.  Asterixis, somewhat improved from yesterday  Skin: Skin is warm and dry. She is not diaphoretic. No erythema.  Palmar erythema, spider angiomas across chest and arms  Nursing note and vitals reviewed.  Labs: CMP Latest Ref Rng & Units 05/14/2019 05/13/2019  Glucose 70 - 99 mg/dL 101(H) 112(H)  BUN 8 - 23 mg/dL 32(H) 45(H)  Creatinine 0.44 - 1.00 mg/dL 1.31(H) 1.59(H)  Sodium 135 - 145 mmol/L 142 141  Potassium 3.5 - 5.1 mmol/L 3.3(L) 3.0(L)  Chloride 98 - 111 mmol/L 109 104  CO2 22 - 32 mmol/L 24 24  Calcium 8.9 - 10.3 mg/dL 9.2 9.8  Total Protein 6.5 - 8.1 g/dL - 7.9  Total Bilirubin 0.3 - 1.2 mg/dL - 0.8  Alkaline Phos 38 - 126 U/L - 102  AST 15 - 41 U/L - 58(H)  ALT 0 - 44 U/L - 33   CBC Latest Ref Rng &  Units 05/14/2019 05/13/2019  WBC 4.0 - 10.5 K/uL 4.2 4.5  Hemoglobin 12.0 - 15.0 g/dL 10.0(L) 11.3(L)  Hematocrit 36.0 - 46.0 % 31.0(L) 35.3(L)  Platelets 150 - 400 K/uL 152 122(L)   Assessment/Plan:  Assessment: Ms. Collums is a 73 yo F w/ a hx of NASH cirrhosis diagnosed by biopsy in 2006, HTN, HLD, T2DM and obesity who presented for evaluation of weakness and confusion with slow thought process, asterixis and high ammonia concerning for hepatic encephalopathy in the setting of dehydration, electrolyte abnormality and AKI feeling somewhat improved w/ lactulose administration.   Plan: Active Problems:   Weakness/Confusion/Hepatic Encephalopathy: -patient w/ hx of NASH cirrhosis presents w/ weakness and confusion w/ slow thought processing on exam, asterixis and elevated ammonia concerning for hepatic encephalopathy in the setting of dehydration, electrolyte abnormality and AKI. CT w/ no intracranial abnormality. Received one dose of lactulose w/ improvement of sx and physical exam. RUQ [place] consistent w/ cirrhosis no masses. -continue lactulose so that patient having 2-3 BMs/day w/ plan for antibiotic treatment outpatient for prevention as patient has diarrhea from IBS at baseline  -while sx likely secondary to hepatic encephalopathy patient also had 4/6 systolic ejection murmur radiating to the carotids on exam today concerning for aortic stenosis which could be a contributor to  her confusion and reports of dizziness; echo ordered to evaluate, last one in 2010 (normal per chart review); fu results -consider Hep A and B vaccines, patient denies prior vaccinations -PT/OT    AKI (acute kidney injury) ([place]) -pt presented w/ Creatinine 1.59 up from baseline around 1.30, dehydrated on exam, with improvement back to baseline after a bolus -monitor w/ BMP    HTN: holding hypertensives    HLD: continue home Zetia 10 mg QD    T2DM: SSI    IBS: holding home lomotil   Dispo: Anticipated discharge in  approximately 1-2 days.  Al Decant, MD 05/14/2019, 6:13 AM Pager: [phone]

## 2019-05-14 NOTE — Evaluation (Signed)
Physical Therapy Evaluation Patient Details Name: Kharmen Buschmann MRN: [id number] DOB: [birth year] Today's Date: 05/14/2019   History of Present Illness  Floree Litwak is a 73 year old female with a medical history of NASH cirrhosis diagnosed by biopsy in 2006, HTN, HLD, T2DM, and obesity who presented to the ED for evaluation of diffuse weakness. CT head: no acute abnormality.  Clinical Impression  Pt presents with an overall decrease in functional mobility secondary to above. PTA, pt was independent with all mobility. Educ on precautions, positioning, therex, and importance of mobility. Today, pt able to ambulate throughout unit and perform transfers in the room without an AD and with min guard-min assist, limited by generalized weakness and balance deficits . Pt would benefit from continued acute PT services to maximize functional mobility and independence prior to d/c home with homehealth therapy to address strength and balance deficits.     Follow Up Recommendations Home health PT;Supervision for mobility/OOB    Equipment Recommendations  None recommended by PT    Recommendations for Other Services       Precautions / Restrictions Precautions Precautions: Fall Restrictions Weight Bearing Restrictions: No      Mobility  Bed Mobility       General bed mobility comments: did not assess this session, pt seated in recliner upon PT arrival  Transfers Overall transfer level: Needs assistance Equipment used: None Transfers: Sit to/from Stand Sit to Stand: Min guard     General transfer comment: sit<>stand from recliner x 2 this session, no AD used  Ambulation/Gait Ambulation/Gait assistance: Min assist Gait Distance (Feet): 80 Feet Assistive device: None Gait Pattern/deviations: Decreased step length - right;Decreased step length - left Gait velocity: decreased Gait velocity interpretation: <1.31 ft/sec, indicative of household ambulator General Gait Details: Pt ambulated  without AD this session, x 2 minimal LOB noted during gait pt able to correct with min assist from therapist. Pt unable to perform dual-task gait task, pt stops walking to answer PT's questions  Stairs            Wheelchair Mobility    Modified Rankin (Stroke Patients Only)       Balance Overall balance assessment: Needs assistance Sitting-balance support: Feet supported Sitting balance-Leahy Scale: Normal     Standing balance support: No upper extremity supported Standing balance-Leahy Scale: Poor Standing balance comment: pt with x 2 LOB during gait requiring min external assist to steady pt, rhomberg and tandem as detailed below pt reports feeling unsteady during eyes closed tasks     Tandem Stance - Right Leg: 8   Rhomberg - Eyes Opened: 30 Rhomberg - Eyes Closed: 18                 Pertinent Vitals/Pain Pain Assessment: No/denies pain    Home Living Family/patient expects to be discharged to:: Other (Comment)(ILF) Living Arrangements: Spouse/significant other Available Help at Discharge: Family;Available PRN/intermittently(son out of town, husband with dementia) Type of Home: Independent living facility Home Access: Level entry     Home Layout: One level Home Equipment: None      Prior Function Level of Independence: Independent         Comments: Assisting spouse as needed as he has dementia     Hand Dominance   Dominant Hand: Right    Extremity/Trunk Assessment   Upper Extremity Assessment Upper Extremity Assessment: Generalized weakness    Lower Extremity Assessment Lower Extremity Assessment: Generalized weakness    Cervical / Trunk Assessment Cervical / Trunk Assessment: Normal  Communication  Communication: Expressive difficulties  Cognition Arousal/Alertness: Awake/alert Behavior During Therapy: WFL for tasks assessed/performed Overall Cognitive Status: Within Functional Limits for tasks assessed             General  Comments: difficulty with dual-task (unable to talk while walking, frequent stops during ambulation to answer questions)      General Comments General comments (skin integrity, edema, etc.): therapist suggested potential need for assistive device at home following further balance assessment, pt not receptive to assistive device use and reports "I don't want to look old"    Exercises     Assessment/Plan    PT Assessment Patient needs continued PT services  PT Problem List Decreased strength;Decreased balance;Decreased mobility;Decreased activity tolerance;Decreased coordination       PT Treatment Interventions DME instruction;Functional mobility training;Balance training;Patient/family education;Gait training;Therapeutic activities;Stair training;Therapeutic exercise    PT Goals (Current goals can be found in the Care Plan section)  Acute Rehab PT Goals Patient Stated Goal: "go home" "I don't want to use a walker, it would make me look old" PT Goal Formulation: With patient Time For Goal Achievement: 05/28/19 Potential to Achieve Goals: Good    Frequency Min 3X/week   Barriers to discharge           AM-PAC PT "6 Clicks" Mobility  Outcome Measure Help needed turning from your back to your side while in a flat bed without using bedrails?: None Help needed moving from lying on your back to sitting on the side of a flat bed without using bedrails?: A Little Help needed moving to and from a bed to a chair (including a wheelchair)?: A Little Help needed standing up from a chair using your arms (e.g., wheelchair or bedside chair)?: A Little Help needed to walk in hospital room?: A Little Help needed climbing 3-5 steps with a railing? : A Little 6 Click Score: 19    End of Session Equipment Utilized During Treatment: Gait belt Activity Tolerance: Patient tolerated treatment well Patient left: in chair;with chair alarm set;with call bell/phone within reach Nurse Communication:  Mobility status PT Visit Diagnosis: Unsteadiness on feet (R26.81);Muscle weakness (generalized) (M62.81)    Time: [id number] PT Time Calculation (min) (ACUTE ONLY): 24 min   Charges:   PT Evaluation $PT Eval Low Complexity: 1 Low PT Treatments $Gait Training: 8-22 mins        Netta Corrigan, PT, DPT Acute Rehab Office [place] 05/14/2019, 12:47 PM

## 2019-05-15 DIAGNOSIS — Z888 Allergy status to other drugs, medicaments and biological substances status: Secondary | ICD-10-CM

## 2019-05-15 DIAGNOSIS — Z886 Allergy status to analgesic agent status: Secondary | ICD-10-CM

## 2019-05-15 DIAGNOSIS — I10 Essential (primary) hypertension: Secondary | ICD-10-CM

## 2019-05-15 DIAGNOSIS — E119 Type 2 diabetes mellitus without complications: Secondary | ICD-10-CM

## 2019-05-15 DIAGNOSIS — Z881 Allergy status to other antibiotic agents status: Secondary | ICD-10-CM

## 2019-05-15 LAB — GLUCOSE, CAPILLARY
Glucose-Capillary: 99 mg/dL (ref 70–99)
Glucose-Capillary: 167 mg/dL — ABNORMAL HIGH (ref 70–99)

## 2019-05-15 MED ORDER — RIFAXIMIN 550 MG PO TABS: 550.0000 mg | ORAL_TABLET | Freq: Two times a day (BID) | ORAL | 0 refills | Status: DC

## 2019-05-15 NOTE — Progress Notes (Signed)
Physical Therapy Treatment Patient Details Name: Sheila [name] MRN: [id number] DOB: 1946-04-04 Today's Date: 05/15/2019    History of Present Illness Sheila [name] is a 73 year old female with a medical history of NASH cirrhosis diagnosed by biopsy in 2006, HTN, HLD, T2DM, and obesity who presented to the ED for evaluation of diffuse weakness. CT head: no acute abnormality.    PT Comments    Patient received in bed. Husband present. She is hopeful that she can go home today. Patient requires supervision for transfers, performed all bed mobility independently. Patient ambulates without AD and min guard assist. No LOB this day, however ambulates with very slow pace and small, shuffle steps. Appears hesitant with ambulation, although reports she feels at baseline level of mobility. Patient will benefit from continued skilled PT to work on strengthening, balance and to ensure safety with mobility.     Follow Up Recommendations  Home health PT;Supervision for mobility/OOB     Equipment Recommendations  Other (comment)(may benefit from AD, however does not want to use RW.)    Recommendations for Other Services       Precautions / Restrictions Precautions Precautions: Fall Restrictions Weight Bearing Restrictions: No    Mobility  Bed Mobility Overal bed mobility: Independent                Transfers Overall transfer level: Needs assistance Equipment used: None Transfers: Sit to/from Stand Sit to Stand: Supervision            Ambulation/Gait Ambulation/Gait assistance: Min guard Gait Distance (Feet): 100 Feet Assistive device: None Gait Pattern/deviations: Decreased stride length;Step-through pattern Gait velocity: decreased   General Gait Details: slow steady gait this session, however seems unsure of herself, reports she feels like she is at baseline.   Stairs             Wheelchair Mobility    Modified Rankin (Stroke Patients Only)       Balance  Overall balance assessment: Needs assistance Sitting-balance support: Feet supported Sitting balance-Leahy Scale: Normal     Standing balance support: No upper extremity supported Standing balance-Leahy Scale: Fair                              Cognition Arousal/Alertness: Awake/alert Behavior During Therapy: WFL for tasks assessed/performed Overall Cognitive Status: Within Functional Limits for tasks assessed                                 General Comments: difficulty with dual-task (unable to talk while walking, frequent stops during ambulation to answer questions) Seems a bit spacey      Exercises      General Comments        Pertinent Vitals/Pain      Home Living                      Prior Function            PT Goals (current goals can now be found in the care plan section) Acute Rehab PT Goals Patient Stated Goal: go home PT Goal Formulation: With patient Time For Goal Achievement: 05/28/19 Potential to Achieve Goals: Good Progress towards PT goals: Progressing toward goals    Frequency    Min 3X/week      PT Plan Current plan remains appropriate    Co-evaluation  AM-PAC PT "6 Clicks" Mobility   Outcome Measure  Help needed turning from your back to your side while in a flat bed without using bedrails?: None Help needed moving from lying on your back to sitting on the side of a flat bed without using bedrails?: None Help needed moving to and from a bed to a chair (including a wheelchair)?: A Little Help needed standing up from a chair using your arms (e.g., wheelchair or bedside chair)?: A Little Help needed to walk in hospital room?: A Little Help needed climbing 3-5 steps with a railing? : A Little 6 Click Score: 20    End of Session Equipment Utilized During Treatment: Gait belt Activity Tolerance: Patient tolerated treatment well Patient left: in bed;with bed alarm set;with call  bell/phone within reach;with family/visitor present Nurse Communication: Mobility status PT Visit Diagnosis: Unsteadiness on feet (R26.81);Muscle weakness (generalized) (M62.81);Difficulty in walking, not elsewhere classified (R26.2)     Time: 1145-1200 PT Time Calculation (min) (ACUTE ONLY): 15 min  Charges:  $Gait Training: 8-22 mins                     Sheila [name], PT, GCS 05/15/19,12:33 PM

## 2019-05-15 NOTE — Discharge Instructions (Signed)
You were admitted for weakness and confusion and found to be dehydrated with low potassium and a high ammonia level concerning for hepatic encephalopathy secondary to your underlying liver disease. We believe the dehydration from taking your lasix was part of what brought on this episode. We treated you with a medication called lactulose to help you have a BM to get rid of some of the ammonia causing your confusion. We also stopped your lasix and gave you fluids to reverse your dehydration. We are recommending that you stop your lasix. We are prescribing you an antibiotic that may help prevent these episodes in the future. While you were here we heard a murmur in your heart and got imaging of your heart which was normal. You should follow up with your primary care doctor within one week to go over your hospitalization. You also need follow up with a Gastroenterologist. Below are some numbers to call:  Dr. Mann/Dr. Benson [place] [facility] [address], [place], [place] [address] [phone]  [facility] [place], [place], [place] [address] [phone]  [facility] [address] [place], [place],  [address] [phone]

## 2019-05-15 NOTE — Progress Notes (Signed)
  Date: 05/15/2019  Patient name: Sheila [name]  Medical record number: [id number]  Date of birth: [birth year]        I have seen and evaluated this patient and I have discussed the plan of care with the house staff. Please see Dr. Webb Silversmith note for complete details. I concur with her findings and plan.  Patient and family are keen to have her home today.  We will ask her to stop lasix.  If she is amenable, we will order home health PT to help with mobility.    Sid Falcon, MD 05/15/2019, 2:14 PM

## 2019-05-15 NOTE — Progress Notes (Signed)
Discharge orders in; I alerted social work of impending discharge - awaiting home health set up for pt

## 2019-05-15 NOTE — TOC Transition Note (Signed)
Transition of Care [facility]) - CM/SW Discharge Note Marvetta Gibbons RN,BSN Transitions of Care Unit 5W cross coverage - RN Case Manager [phone]   Patient Details  Name: Sheila [name] MRN: [id number] Date of Birth: 28-Jan-1946  Transition of Care [facility]) CM/SW Contact:  Dawayne Patricia, RN Phone Number: 05/15/2019, 3:11 PM   Clinical Narrative:    Pt stable for transition home today, orders placed for HHPT/OT, CM spoke with pt at bedside regarding recommendations- pt reports she lives at [place] with her spouse. She was independent and still driving prior to admission. Pt hesitant but open to [facility] services. List provided to pt Per CMS guidelines from medicare.gov website with star ratings (copy placed in shadow chart)- pt states she does not have a preference for agency and is ok with CM making referral based on highest star ratings. Pt declining RW at this time. Call made to [facility] with [facility] for HHPT/OT needs- referral has been accepted.    Final next level of care: [place] Barriers to Discharge: No Barriers Identified   Patient Goals and CMS Choice Patient states their goals for this hospitalization and ongoing recovery are:: "to stay independent" CMS Medicare.gov Compare Post Acute Care list provided to:: Patient Choice offered to / list presented to : Patient  Discharge Placement  Home with [facility]                     Discharge Plan and Services   Discharge Planning Services: CM Consult Post Acute Care Choice: Durable Medical Equipment, Home Health          DME Arranged: N/A DME Agency: NA       HH Arranged: PT, OT HH Agency: [place] Date [place]: 05/15/19 Time [place]: [id number] Representative spoke with at [place]: Tommi Rumps  Social Determinants of Health ([place]) Interventions     Readmission Risk Interventions Readmission Risk Prevention Plan 05/15/2019  Post Dischage Appt Complete  Medication  Screening Complete  Transportation Screening Complete

## 2019-05-15 NOTE — Progress Notes (Signed)
Pt ready for discharge. I removed her IV and discussed discharge plans with patient. Pt able to teach back about medication changes and about following up with her PCP. AVS given. Pt calling her husband for ride.

## 2019-05-15 NOTE — Progress Notes (Signed)
Subjective: Sheila [name] is feeling much better today. She feels less confused and less weak. She reports one BM yesterday and one this AM. She is in agreement that the lasix may have precipitated her sx.   Objective:  Vital signs in last 24 hours: Vitals:   05/14/19 0527 05/14/19 1226 05/14/19 2045 05/15/19 0510  BP: (!) 148/64 (!) 141/64 (!) 132/54 (!) 148/61  Pulse: 79 76 71 70  Resp: 18 16 19 17   Temp: 98.7 F (37.1 C) 98.2 F (36.8 C) 98.7 F (37.1 C) 98.7 F (37.1 C)  TempSrc:  Oral Oral   SpO2: 97% 100% 94% 96%  Weight:      Height:       Physical Exam  Constitutional: She is oriented to person, place, and time and well-developed, well-nourished, and in no distress. No distress.  HENT:  Head: Normocephalic and atraumatic.  Cardiovascular: Normal rate and regular rhythm.  Murmur heard. 4/6 systolic murmur that radiates to the carotids  Pulmonary/Chest: Effort normal and breath sounds normal. No respiratory distress.  Abdominal: Soft. Bowel sounds are normal. She exhibits no distension. There is no abdominal tenderness.  Neurological: She is alert and oriented to person, place, and time.  No asterixis  Skin: Skin is warm and dry. She is not diaphoretic. No erythema.  spider angiomas across chest and arms  Nursing note and vitals reviewed.  Labs: CMP Latest Ref Rng & Units 05/14/2019 05/13/2019  Glucose 70 - 99 mg/dL 101(H) 112(H)  BUN 8 - 23 mg/dL 32(H) 45(H)  Creatinine 0.44 - 1.00 mg/dL 1.31(H) 1.59(H)  Sodium 135 - 145 mmol/L 142 141  Potassium 3.5 - 5.1 mmol/L 3.3(L) 3.0(L)  Chloride 98 - 111 mmol/L 109 104  CO2 22 - 32 mmol/L 24 24  Calcium 8.9 - 10.3 mg/dL 9.2 9.8  Total Protein 6.5 - 8.1 g/dL - 7.9  Total Bilirubin 0.3 - 1.2 mg/dL - 0.8  Alkaline Phos 38 - 126 U/L - 102  AST 15 - 41 U/L - 58(H)  ALT 0 - 44 U/L - 33   CBC Latest Ref Rng & Units 05/14/2019 05/13/2019  WBC 4.0 - 10.5 K/uL 4.2 4.5  Hemoglobin 12.0 - 15.0 g/dL 10.0(L) 11.3(L)  Hematocrit 36.0 -  46.0 % 31.0(L) 35.3(L)  Platelets 150 - 400 K/uL 152 122(L)   Assessment/Plan:  Assessment: Sheila [name] is a 73 yo F w/ a hx of NASH cirrhosis diagnosed by biopsy in 2006, HTN, HLD, T2DM and obesity who presented for evaluation of weakness and confusion with slow thought process, asterixis and high ammonia concerning for hepatic encephalopathy in the setting of dehydration, electrolyte abnormality and AKI feeling improved w/ lactuose an cessation of lasix.   Plan: Active Problems:   Weakness/Confusion/Hepatic Encephalopathy: -patient w/ hx of NASH cirrhosis presents w/ weakness and confusion w/ slow thought processing on exam, asterixis and elevated ammonia concerning for hepatic encephalopathy in the setting of dehydration, electrolyte abnormality and AKI. CT w/ no intracranial abnormality. In ED received one dose of lactulose w/ improvement of sx and physical exam. RUQ [place] consistent w/ cirrhosis no masses. Patient much improved with less confusion and weakness and no asterixis.  -One BM yesterday and one today -patient reports she has been vaccinated for Hep A and B -PT/OT recommend HH -Xifaxan for prophylaxis on dc    AKI (acute kidney injury) (resolved) -pt presented w/ Creatinine 1.59 up from baseline around 1.30, dehydrated on exam, with improvement back to baseline after a bolus  HTN: holding home bp meds    HLD: continue home Zetia 10 mg QD    T2DM: SSI    IBS: holding home lomotil   Dispo: Anticipated discharge today  Al Decant, MD 05/15/2019, 11:41 AM Pager: [phone]

## 2019-05-15 NOTE — Discharge Summary (Signed)
Name: Sheila [name] MRN: [id number] DOB: [birth year] 72 y.o. PCP: Chesley Noon, MD  Date of Admission: 05/13/2019  7:07 AM Date of Discharge: 05/15/2019  Attending Physician: No att. providers found  Discharge Diagnosis:  1. Hepatic Encephalopathy 2. AKI  Discharge Medications: Allergies as of 05/15/2019      Reactions   Acetaminophen    Other reaction(s): Other Patient avoids Tylenol due to fatty liver disease   Cefazolin Other (See Comments)   Other reaction(s): Other caused C-DIF caused C-DIF c dif caused C-DIF   Ciprofloxacin Hcl Nausea Only   Ibuprofen    Other reaction(s): Other Diabetic--Concerns about kidneys   Sulfa Antibiotics    Other reaction(s): Hives   Naproxen Sodium    Causes pain in veins   Amoxicillin Diarrhea   Other reaction(s): Diarrhea      Medication List    STOP taking these medications   furosemide 20 MG tablet Commonly known as: LASIX     TAKE these medications   amLODipine 10 MG tablet Commonly known as: NORVASC Take 10 mg by mouth daily.   ARIPiprazole 10 MG tablet Commonly known as: ABILIFY Take 10 mg by mouth at bedtime.   buPROPion 300 MG 24 hr tablet Commonly known as: WELLBUTRIN XL Take 300 mg by mouth daily.   Calcium 500-125 MG-UNIT Tabs Take 2 tablets by mouth daily.   clobetasol cream 0.05 % Commonly known as: TEMOVATE Apply 1 application topically daily as needed for rash.   diclofenac sodium 1 % Gel Commonly known as: VOLTAREN Apply 2 g topically 2 (two) times daily as needed for pain.   diphenoxylate-atropine 2.5-0.025 MG tablet Commonly known as: LOMOTIL Take 2 tablets by mouth 4 (four) times daily.   ezetimibe 10 MG tablet Commonly known as: ZETIA Take 10 mg by mouth every morning.   ferrous sulfate 325 (65 FE) MG EC tablet Take 325 mg by mouth 2 (two) times daily.   FLUoxetine 40 MG capsule Commonly known as: PROZAC Take 40 mg by mouth daily.   gabapentin 100 MG capsule Commonly known as:  NEURONTIN Take 200 mg by mouth daily.   Janumet XR [phone] MG Tb24 Generic drug: SitaGLIPtin-MetFORMIN HCl Take 1 tablet by mouth every evening.   metoprolol tartrate 25 MG tablet Commonly known as: LOPRESSOR Take 25 mg by mouth 2 (two) times daily.   omeprazole 40 MG capsule Commonly known as: PRILOSEC Take 40 mg by mouth daily.   Prevagen Extra Strength 20 MG Caps Generic drug: Apoaequorin Take 20 mg by mouth daily.   rifaximin 550 MG Tabs tablet Commonly known as: Xifaxan Take 1 tablet (550 mg total) by mouth 2 (two) times daily.   Systane 0.4-0.3 % Soln Generic drug: Polyethyl Glycol-Propyl Glycol Place 1 drop into both eyes 2 (two) times daily.   telmisartan-hydrochlorothiazide 80-25 MG tablet Commonly known as: MICARDIS HCT Take 1 tablet by mouth daily.      Disposition and follow-up:   Ms.Mckenna Ashkar was discharged from [facility] in Stable condition.  At the hospital follow up visit please address:   1.  Please ensure patient gets referral to GI and has had appropriate hepatitis vaccinations   2.  Labs / imaging needed at time of follow-up: none  3.  Pending labs/ test needing follow-up: none  Follow-up Appointments: Follow-up Information    Chesley Noon, MD Follow up.   Specialty: Family Medicine Why: Please schedule a follow up with your primary care doctor within 1 week.  Contact information: [place] [address] [phone]        Care, [facility] Follow up.   Specialty: [place] Why: HHPT/OT arranged- they will call you to set up home visits Contact information: [place] [address] [phone]           Hospital Course by problem list:   1. Hepatic Encephalopathy -pt w/ hx of NASH cirrhosis presents w/ weakness and confusion w/ slow thought processing on exam, asterixis and elevated ammonia concerning for hepatic encephalopathy in the  setting of dehydration, electrolyte abnormality and AKI. CT w/ no intracranial abnormality. In ED received one dose of lactulose w/ improvement of sx and physical exam. RUQ [place] consistent w/ cirrhosis no masses. Patient improved w/ BMs and treatment of resolution of her dehydration, AKI and electrolyte abnormalities.  2. AKI -pt presented w/ Creatinine of 1.59 up from baseline of around 1.3, dehydrated on exam, with improvement back to baseline after fluids   Discharge Vitals:   BP (!) 147/79 (BP Location: Right Arm)   Pulse 74   Temp 98.4 F (36.9 C) (Oral)   Resp 16   Ht 5\' 6"  (1.676 m)   Wt 82.5 kg   SpO2 98%   BMI 29.36 kg/m   Pertinent Labs, Studies, and Procedures:   CMP Latest Ref Rng & Units 05/14/2019 05/13/2019  Glucose 70 - 99 mg/dL 101(H) 112(H)  BUN 8 - 23 mg/dL 32(H) 45(H)  Creatinine 0.44 - 1.00 mg/dL 1.31(H) 1.59(H)  Sodium 135 - 145 mmol/L 142 141  Potassium 3.5 - 5.1 mmol/L 3.3(L) 3.0(L)  Chloride 98 - 111 mmol/L 109 104  CO2 22 - 32 mmol/L 24 24  Calcium 8.9 - 10.3 mg/dL 9.2 9.8  Total Protein 6.5 - 8.1 g/dL - 7.9  Total Bilirubin 0.3 - 1.2 mg/dL - 0.8  Alkaline Phos 38 - 126 U/L - 102  AST 15 - 41 U/L - 58(H)  ALT 0 - 44 U/L - 33   CBC Latest Ref Rng & Units 05/14/2019 05/13/2019  WBC 4.0 - 10.5 K/uL 4.2 4.5  Hemoglobin 12.0 - 15.0 g/dL 10.0(L) 11.3(L)  Hematocrit 36.0 - 46.0 % 31.0(L) 35.3(L)  Platelets 150 - 400 K/uL 152 122(L)   Ammonia: 141  RUQ [place]:  Cirrhotic change in the liver without focal mass. Small right renal cyst.  CT Head WO:  1.  No acute intracranial abnormality identified. 2. Mild to moderate for age cerebral white matter hypodensity, most commonly due to small vessel disease. 3. Suggestion of disproportionate biparietal cerebral volume loss, nonspecific but can be seen with dementia.  Discharge Instructions: Discharge Instructions    Diet - low sodium heart healthy   Complete by: As directed    Face-to-face encounter (required  for Medicare/Medicaid patients)   Complete by: As directed    I Al Decant certify that this patient is under my care and that I, or a nurse practitioner or physician's assistant working with me, had a face-to-face encounter that meets the physician face-to-face encounter requirements with this patient on 05/15/2019. The encounter with the patient was in whole, or in part for the following medical condition(s) which is the primary reason for home health care (List medical condition): weakness and confusion secondary to hepatic encephalopathy   The encounter with the patient was in whole, or in part, for the following medical condition, which is the primary reason for home health care: weakness   I certify that,  based on my findings, the following services are medically necessary home health services: Physical therapy   Reason for Medically Necessary Home Health Services: Therapy- Personnel officer, Public librarian   My clinical findings support the need for the above services: Unsafe ambulation due to balance issues   Further, I certify that my clinical findings support that this patient is homebound due to: Unsafe ambulation due to balance issues   Home Health   Complete by: As directed    To provide the following care/treatments:  OT PT     Increase activity slowly   Complete by: As directed        Discharge Instructions     You were admitted for weakness and confusion and found to be dehydrated with low potassium and a high ammonia level concerning for hepatic encephalopathy secondary to your underlying liver disease. We believe the dehydration from taking your lasix was part of what brought on this episode. We treated you with a medication called lactulose to help you have a BM to get rid of some of the ammonia causing your confusion. We also stopped your lasix and gave you fluids to reverse your dehydration. We are recommending that you stop your lasix. We are prescribing  you an antibiotic that may help prevent these episodes in the future. While you were here we heard a murmur in your heart and got imaging of your heart which was normal. You should follow up with your primary care doctor within one week to go over your hospitalization. You also need follow up with a Gastroenterologist. Below are some numbers to call:  Dr. Mann/Dr. Benson [place] [facility] [address], [place], [place] [address] [phone]  [facility] [place], [place], [place] [address] [phone]  [facility] [address] [place],  [address] [phone]     Signed: Al Decant, MD 05/16/2019, 4:08 PM   Pager: [phone]

## 2019-05-22 ENCOUNTER — Emergency Department (HOSPITAL_COMMUNITY): Payer: Medicare Other

## 2019-05-22 ENCOUNTER — Encounter (HOSPITAL_COMMUNITY): Payer: Self-pay | Admitting: Cardiology

## 2019-05-22 ENCOUNTER — Inpatient Hospital Stay (HOSPITAL_COMMUNITY)
Admission: EM | Admit: 2019-05-22 | Discharge: 2019-05-26 | DRG: 442 | Disposition: A | Discharge status: Home / Self Care | Payer: Medicare Other | Attending: Internal Medicine | Admitting: Internal Medicine

## 2019-05-22 ENCOUNTER — Other Ambulatory Visit: Payer: Self-pay

## 2019-05-22 DIAGNOSIS — Z881 Allergy status to other antibiotic agents status: Secondary | ICD-10-CM | POA: Diagnosis not present

## 2019-05-22 DIAGNOSIS — R072 Precordial pain: Secondary | ICD-10-CM | POA: Diagnosis present

## 2019-05-22 DIAGNOSIS — Z888 Allergy status to other drugs, medicaments and biological substances status: Secondary | ICD-10-CM | POA: Diagnosis not present

## 2019-05-22 DIAGNOSIS — E1122 Type 2 diabetes mellitus with diabetic chronic kidney disease: Secondary | ICD-10-CM | POA: Diagnosis present

## 2019-05-22 DIAGNOSIS — Z8249 Family history of ischemic heart disease and other diseases of the circulatory system: Secondary | ICD-10-CM | POA: Diagnosis not present

## 2019-05-22 DIAGNOSIS — K7469 Other cirrhosis of liver: Secondary | ICD-10-CM | POA: Diagnosis not present

## 2019-05-22 DIAGNOSIS — N179 Acute kidney failure, unspecified: Secondary | ICD-10-CM | POA: Diagnosis not present

## 2019-05-22 DIAGNOSIS — Z79899 Other long term (current) drug therapy: Secondary | ICD-10-CM

## 2019-05-22 DIAGNOSIS — K7581 Nonalcoholic steatohepatitis (NASH): Secondary | ICD-10-CM | POA: Diagnosis present

## 2019-05-22 DIAGNOSIS — I248 Other forms of acute ischemic heart disease: Secondary | ICD-10-CM | POA: Diagnosis not present

## 2019-05-22 DIAGNOSIS — I2489 Other forms of acute ischemic heart disease: Secondary | ICD-10-CM

## 2019-05-22 DIAGNOSIS — I214 Non-ST elevation (NSTEMI) myocardial infarction: Secondary | ICD-10-CM | POA: Diagnosis present

## 2019-05-22 DIAGNOSIS — I1310 Hypertensive heart and chronic kidney disease without heart failure, with stage 1 through stage 4 chronic kidney disease, or unspecified chronic kidney disease: Secondary | ICD-10-CM | POA: Diagnosis not present

## 2019-05-22 DIAGNOSIS — K7682 Hepatic encephalopathy: Secondary | ICD-10-CM | POA: Diagnosis present

## 2019-05-22 DIAGNOSIS — K589 Irritable bowel syndrome without diarrhea: Secondary | ICD-10-CM | POA: Diagnosis not present

## 2019-05-22 DIAGNOSIS — K7460 Unspecified cirrhosis of liver: Secondary | ICD-10-CM | POA: Diagnosis present

## 2019-05-22 DIAGNOSIS — I10 Essential (primary) hypertension: Secondary | ICD-10-CM | POA: Diagnosis not present

## 2019-05-22 DIAGNOSIS — Z20828 Contact with and (suspected) exposure to other viral communicable diseases: Secondary | ICD-10-CM | POA: Diagnosis present

## 2019-05-22 DIAGNOSIS — Z90710 Acquired absence of both cervix and uterus: Secondary | ICD-10-CM | POA: Diagnosis not present

## 2019-05-22 DIAGNOSIS — Z23 Encounter for immunization: Secondary | ICD-10-CM

## 2019-05-22 DIAGNOSIS — I493 Ventricular premature depolarization: Secondary | ICD-10-CM | POA: Diagnosis not present

## 2019-05-22 DIAGNOSIS — Z886 Allergy status to analgesic agent status: Secondary | ICD-10-CM | POA: Diagnosis not present

## 2019-05-22 DIAGNOSIS — K7290 Hepatic failure, unspecified without coma: Secondary | ICD-10-CM | POA: Diagnosis not present

## 2019-05-22 DIAGNOSIS — N183 Chronic kidney disease, stage 3 (moderate): Secondary | ICD-10-CM | POA: Diagnosis not present

## 2019-05-22 DIAGNOSIS — Z9049 Acquired absence of other specified parts of digestive tract: Secondary | ICD-10-CM | POA: Diagnosis not present

## 2019-05-22 DIAGNOSIS — R278 Other lack of coordination: Secondary | ICD-10-CM | POA: Diagnosis present

## 2019-05-22 DIAGNOSIS — R011 Cardiac murmur, unspecified: Secondary | ICD-10-CM | POA: Diagnosis not present

## 2019-05-22 DIAGNOSIS — R7989 Other specified abnormal findings of blood chemistry: Secondary | ICD-10-CM | POA: Diagnosis not present

## 2019-05-22 DIAGNOSIS — Z7984 Long term (current) use of oral hypoglycemic drugs: Secondary | ICD-10-CM | POA: Diagnosis not present

## 2019-05-22 DIAGNOSIS — Z87891 Personal history of nicotine dependence: Secondary | ICD-10-CM

## 2019-05-22 DIAGNOSIS — D631 Anemia in chronic kidney disease: Secondary | ICD-10-CM | POA: Diagnosis present

## 2019-05-22 DIAGNOSIS — E7800 Pure hypercholesterolemia, unspecified: Secondary | ICD-10-CM | POA: Diagnosis not present

## 2019-05-22 DIAGNOSIS — R509 Fever, unspecified: Secondary | ICD-10-CM | POA: Diagnosis not present

## 2019-05-22 DIAGNOSIS — E119 Type 2 diabetes mellitus without complications: Secondary | ICD-10-CM | POA: Diagnosis not present

## 2019-05-22 DIAGNOSIS — R079 Chest pain, unspecified: Secondary | ICD-10-CM | POA: Diagnosis present

## 2019-05-22 DIAGNOSIS — Z882 Allergy status to sulfonamides status: Secondary | ICD-10-CM

## 2019-05-22 DIAGNOSIS — I129 Hypertensive chronic kidney disease with stage 1 through stage 4 chronic kidney disease, or unspecified chronic kidney disease: Secondary | ICD-10-CM | POA: Diagnosis not present

## 2019-05-22 DIAGNOSIS — K580 Irritable bowel syndrome with diarrhea: Secondary | ICD-10-CM | POA: Diagnosis not present

## 2019-05-22 DIAGNOSIS — R778 Other specified abnormalities of plasma proteins: Secondary | ICD-10-CM

## 2019-05-22 DIAGNOSIS — E785 Hyperlipidemia, unspecified: Secondary | ICD-10-CM | POA: Diagnosis present

## 2019-05-22 LAB — COMPREHENSIVE METABOLIC PANEL
ALT: 25 U/L (ref 0–44)
AST: 45 U/L — ABNORMAL HIGH (ref 15–41)
Albumin: 3.3 g/dL — ABNORMAL LOW (ref 3.5–5.0)
Alkaline Phosphatase: 104 U/L (ref 38–126)
Anion gap: 8 (ref 5–15)
BUN: 15 mg/dL (ref 8–23)
CO2: 24 mmol/L (ref 22–32)
Calcium: 9.5 mg/dL (ref 8.9–10.3)
Chloride: 107 mmol/L (ref 98–111)
Creatinine, Ser: 1.15 mg/dL — ABNORMAL HIGH (ref 0.44–1.00)
GFR calc Af Amer: 54 mL/min — ABNORMAL LOW (ref >60)
GFR calc non Af Amer: 47 mL/min — ABNORMAL LOW (ref >60)
Glucose, Bld: 172 mg/dL — ABNORMAL HIGH (ref 70–99)
Potassium: 3.7 mmol/L (ref 3.5–5.1)
Sodium: 139 mmol/L (ref 135–145)
Total Bilirubin: 1.2 mg/dL (ref 0.3–1.2)
Total Protein: 7.4 g/dL (ref 6.5–8.1)

## 2019-05-22 LAB — CBC
HCT: 34.6 % — ABNORMAL LOW (ref 36.0–46.0)
Hemoglobin: 10.6 g/dL — ABNORMAL LOW (ref 12.0–15.0)
MCH: 29.3 pg (ref 26.0–34.0)
MCHC: 30.6 g/dL (ref 30.0–36.0)
MCV: 95.6 fL (ref 80.0–100.0)
Platelets: 160 10*3/uL (ref 150–400)
RBC: 3.62 MIL/uL — ABNORMAL LOW (ref 3.87–5.11)
RDW: 18.6 % — ABNORMAL HIGH (ref 11.5–15.5)
WBC: 6.3 10*3/uL (ref 4.0–10.5)
nRBC: 0 % (ref 0.0–0.2)

## 2019-05-22 LAB — TROPONIN I (HIGH SENSITIVITY)
Troponin I (High Sensitivity): 69 ng/L — ABNORMAL HIGH (ref <18)
Troponin I (High Sensitivity): 57 ng/L — ABNORMAL HIGH (ref <18)

## 2019-05-22 MED ORDER — SENNOSIDES-DOCUSATE SODIUM 8.6-50 MG PO TABS: 1.0000 | ORAL_TABLET | Freq: Every evening | ORAL | Status: DC | PRN

## 2019-05-22 MED ORDER — RIFAXIMIN 550 MG PO TABS
550.0000 mg | ORAL_TABLET | Freq: Two times a day (BID) | ORAL | Status: DC
  Administered 2019-05-23 – 2019-05-26 (×7): 550 mg via ORAL
  Filled 2019-05-22 (×9): qty 1

## 2019-05-22 MED ORDER — LACTULOSE 10 GM/15ML PO SOLN
20.0000 g | Freq: Once | ORAL | Status: AC
  Administered 2019-05-22: 21:00:00 20 g via ORAL
  Filled 2019-05-22: qty 30

## 2019-05-22 MED ORDER — ONDANSETRON HCL 4 MG/2ML IJ SOLN: 4.0000 mg | Freq: Once | INTRAMUSCULAR | Status: DC

## 2019-05-22 MED ORDER — HEPARIN (PORCINE) 25000 UT/250ML-% IV SOLN
1300.0000 [IU]/h | INTRAVENOUS | Status: DC
  Administered 2019-05-22: 17:00:00 900 [IU]/h via INTRAVENOUS
  Administered 2019-05-23: 1050 [IU]/h via INTRAVENOUS
  Administered 2019-05-24: 1300 [IU]/h via INTRAVENOUS
  Filled 2019-05-22 (×3): qty 250

## 2019-05-22 MED ORDER — MORPHINE SULFATE (PF) 4 MG/ML IV SOLN
4.0000 mg | INTRAVENOUS | Status: DC | PRN
  Administered 2019-05-22: 4 mg via INTRAVENOUS
  Filled 2019-05-22: qty 1

## 2019-05-22 MED ORDER — ONDANSETRON HCL 4 MG/2ML IJ SOLN: 4.0000 mg | Freq: Three times a day (TID) | INTRAMUSCULAR | Status: DC | PRN

## 2019-05-22 MED ORDER — NITROGLYCERIN IN D5W 200-5 MCG/ML-% IV SOLN
0.0000 ug/min | INTRAVENOUS | Status: DC
  Administered 2019-05-22: 5 ug/min via INTRAVENOUS
  Filled 2019-05-22: qty 250

## 2019-05-22 MED ORDER — NITROGLYCERIN 0.4 MG SL SUBL
0.4000 mg | SUBLINGUAL_TABLET | SUBLINGUAL | Status: DC | PRN
  Administered 2019-05-22 (×2): 0.4 mg via SUBLINGUAL
  Filled 2019-05-22: qty 1

## 2019-05-22 MED ORDER — HEPARIN BOLUS VIA INFUSION
4000.0000 [IU] | Freq: Once | INTRAVENOUS | Status: AC
  Administered 2019-05-22: 17:00:00 4000 [IU] via INTRAVENOUS
  Filled 2019-05-22: qty 4000

## 2019-05-22 NOTE — ED Triage Notes (Addendum)
Pt arrived via EMS with c/o chest pain started yesterday. Per ems, chest pain started yesterday while she was at the dentist and it radiates to her jaw intermittently. Patient states she did have some nausea yesterday as well.   Pain is a 7/10 at this time. States she can't take asprin due to a previous gi bleed.

## 2019-05-22 NOTE — Progress Notes (Signed)
ANTICOAGULATION CONSULT NOTE - Initial Consult   Pharmacy Consult for Heparin Indication: chest pain/ACS  Allergies  Allergen Reactions  . Acetaminophen     Other reaction(s): Other Patient avoids Tylenol due to fatty liver disease  . Cefazolin Other (See Comments)    Other reaction(s): Other caused C-DIF caused C-DIF c dif caused C-DIF   . Ciprofloxacin Hcl Nausea Only  . Ibuprofen     Other reaction(s): Other Diabetic--Concerns about kidneys  . Sulfa Antibiotics     Other reaction(s): Hives  . Naproxen Sodium     Causes pain in veins  . Amoxicillin Diarrhea    Other reaction(s): Diarrhea    Patient Measurements: Height: 5\' 6"  (167.6 cm) Weight: 177 lb (80.3 kg) IBW/kg (Calculated) : 59.3 Heparin Dosing Weight: 76 kg  Vital Signs: Temp: 98.3 F (36.8 C) (09/17 1302) Temp Source: Oral (09/17 1302) BP: 153/63 (09/17 1515) Pulse Rate: 61 (09/17 1515)  Labs: Recent Labs    05/22/19 1445  HGB 10.6*  HCT 34.6*  PLT 160  CREATININE 1.15*  TROPONINIHS 69*    Estimated Creatinine Clearance: 45.9 mL/min (A) (by C-G formula based on SCr of 1.15 mg/dL (H)).   Medical History: Past Medical History:  Diagnosis Date  . Diabetes mellitus without complication ([place])   . Hypertension   . NASH (nonalcoholic steatohepatitis)     Medications:  Scheduled:  . heparin  4,000 Units Intravenous Once   Infusions:  . heparin    . nitroGLYCERIN      Assessment: 73 y.o. female presenting with chest pain. PMH includes DM, HTN, HLD, and encephalopathy. Of note, history of GI bleed however no reports of a current bleed. No anticoagulation PTA. Hgb 10.6, Plts 160. Pharmacy consulted for heparin dosing.  Goal of Therapy:  Heparin level 0.3-0.7 units/ml Monitor platelets by anticoagulation protocol: Yes   Plan:  Heparin bolus 4000 units Heparin gtt 900 units/hr Check 8hr HL Daily HL and CBC Monitor s/sx of bleeding   Lorel Monaco, PharmD PGY1 Ambulatory Care  Resident [employer] # [phone]

## 2019-05-22 NOTE — H&P (Signed)
Date: 05/22/2019               Patient Name:  Sheila [name] MRN: [id number]  DOB: Aug 14, 1946 Age / Sex: 73 y.o., female   PCP: Chesley Noon, MD         Medical Service: Internal Medicine Teaching Service         Attending Physician: Dr. Rebeca Alert Raynaldo Opitz, MD    First Contact: Dr. Court Joy Pager: [phone]  Second Contact: Dr. Sherry Ruffing Pager: [phone]       After Hours (After 5p/  First Contact Pager: [phone]  weekends / holidays): Second Contact Pager: [phone]   Chief Complaint: Chest Pain   History of Present Illness: Sheila [name] is a 73 year old female with  HTN, HLD, T2DM, IBS, [name] cirrhosis diagnosed by biopsy in 2006, and recent admission 9/8-9/10 for hepatic encephalopathy with elevated ammonia levels and AKI who present with chest pain. Patient is alert and oriented x 3 on exam but has difficulty finding her words. She was followed by our team on last admission and this is consistent with her previous presenting symptoms of foggy and difficulty thinking. She endorses chest pain that began yesterday, but has a hard time answering further questions.   Patient seen by cardiology earlier today who received a better history. Patient endorses chest pain at rest and exertion. Reported to have had intense chest pain while at dentist and encouraged to go to the ED. Patient declined to go to ED, but decided to come today because of another episode of CP.  Meds:  No current facility-administered medications on file prior to encounter.    Current Outpatient Medications on File Prior to Encounter  Medication Sig Dispense Refill  . amLODipine (NORVASC) 10 MG tablet Take 10 mg by mouth daily.    Marland Kitchen Apoaequorin (PREVAGEN EXTRA STRENGTH) 20 MG CAPS Take 20 mg by mouth daily.    . ARIPiprazole (ABILIFY) 10 MG tablet Take 10 mg by mouth at bedtime.    Marland Kitchen buPROPion (WELLBUTRIN XL) 300 MG 24 hr tablet Take 300 mg by mouth daily.    . Calcium 500-125 MG-UNIT TABS Take 2 tablets by mouth  daily.    . clobetasol cream (TEMOVATE) [id number] % Apply 1 application topically daily as needed for rash.    . diclofenac sodium (VOLTAREN) 1 % GEL Apply 2 g topically 2 (two) times daily as needed for pain.    . diphenoxylate-atropine (LOMOTIL) 2.5-0.025 MG tablet Take 2 tablets by mouth 4 (four) times daily.    Marland Kitchen ezetimibe (ZETIA) 10 MG tablet Take 10 mg by mouth every morning.    . ferrous sulfate 325 (65 FE) MG EC tablet Take 325 mg by mouth 2 (two) times daily.    Marland Kitchen FLUoxetine (PROZAC) 40 MG capsule Take 40 mg by mouth daily.    Marland Kitchen gabapentin (NEURONTIN) 100 MG capsule Take 200 mg by mouth daily.    . metoprolol tartrate (LOPRESSOR) 25 MG tablet Take 25 mg by mouth 2 (two) times daily.    Marland Kitchen omeprazole (PRILOSEC) 40 MG capsule Take 40 mg by mouth daily.    Vladimir Faster Glycol-Propyl Glycol (SYSTANE) 0.4-0.3 % SOLN Place 1 drop into both eyes 2 (two) times daily.    . rifaximin (XIFAXAN) 550 MG TABS tablet Take 1 tablet (550 mg total) by mouth 2 (two) times daily. 60 tablet 0  . SitaGLIPtin-MetFORMIN HCl (JANUMET XR) [phone] MG TB24 Take 1 tablet by mouth every evening.    Marland Kitchen  telmisartan-hydrochlorothiazide (MICARDIS HCT) 80-25 MG tablet Take 1 tablet by mouth daily.      Allergies: Allergies as of 05/22/2019 - Review Complete 05/13/2019  Allergen Reaction Noted  . Acetaminophen  09/23/2013  . Cefazolin Other (See Comments) 01/14/2015  . Ciprofloxacin hcl Nausea Only 09/28/2010  . Ibuprofen  09/23/2013  . Sulfa antibiotics Hives 01/06/2014  . Naproxen sodium  12/28/2010  . Amoxicillin Diarrhea 10/12/2014   Past Medical History:  Diagnosis Date  . Diabetes mellitus without complication ([place])   . Hypertension   . NASH (nonalcoholic steatohepatitis)     Family History: HTN and DM mother and father    Social History: Patient recently moved to [place] from [place]. Former Scientist, physiological of a college. She lives with her husband of 12 years.  Able to complete ADLs at home.  She denies alcohol  and illicit substance use.  She is a former smoker with a 15-pack-year history.  Quit smoking 12 years ago.  Review of Systems: A complete ROS was negative except as per HPI.   Physical Exam: Blood pressure (!) 121/59, pulse 62, temperature 98.3 F (36.8 C), temperature source Oral, resp. rate (!) 24, height 5\' 6"  (1.676 m), weight 80.3 kg, SpO2 91 %.  Physical Exam Constitutional:      General: She is not in acute distress.    Comments: Appears tired  Eyes:     General: No scleral icterus. Cardiovascular:     Rate and Rhythm: Normal rate and regular rhythm.     Heart sounds: Murmur present. Systolic murmur present with a grade of 2/6.  Pulmonary:     Effort: Pulmonary effort is normal.     Breath sounds: No wheezing, rhonchi or rales.  Abdominal:     General: Bowel sounds are normal.     Palpations: Abdomen is soft.  Skin:    General: Skin is warm and dry.  Neurological:     Mental Status: She is oriented to person, place, and time. She is lethargic.     Cranial Nerves: Cranial nerves are intact.     Sensory: Sensation is intact.     Motor: Motor function is intact.     Comments: Asterixis on exam  Psychiatric:        Behavior: Behavior is cooperative.        Thought Content: Thought content normal.        Cognition and Memory: Cognition is impaired.     Comments: Impaired attention, unable to answer any serial 7's. Spells World correctly, cant spell backword     Assessment & Plan by Problem: Active Problems:   Chest pain  Sheila [name] is a 73 year old female with  HTN, HLD, T2DM, IBS, NASH cirrhosis diagnosed by biopsy in 2006, and recent admission 9/8-9/10 for hepatic encephalopathy with elevated ammonia levels and AKI who present with chest pain.  #Chest Pain Chest pain , patient unable to characterize on exam. Reported to improve with nitro, Trop (high-sensitivity) 69>57, EKG with NSR with slight inferior ST elevations. Cardiology evaluating and plans for ischemic  evaluation in AM. - IV Nitro  - IV Heparin - Asprin    #Acute encephalopathy Likely due to NASH cirrhosis. Recent admission for hepatic encephalopathy and patient reports managing her own medication. Sent out on rafaxmin, reports taking. A&O x3,confused with mild asterixis on exam  - Lactulose .   Dispo: Admit patient to Inpatient with expected length of stay greater than 2 midnights.  Signed: Tamsen Snider, MD PGY1  [phone]

## 2019-05-22 NOTE — ED Notes (Signed)
Got patient undress on the monitor did ekg shown to er doctor patient is resting with nurse at bedside and call bell in reach 

## 2019-05-22 NOTE — Consult Note (Addendum)
Cardiology Consultation:   Patient ID: Sheila [name] MRN: [id number]; DOB: November 16, 1945  Admit date: 05/22/2019 Date of Consult: 05/22/2019  Primary Care Provider: Chesley Noon, MD Primary Cardiologist: New to [facility] Primary Electrophysiologist:  None    Patient Profile:   Ms. Burdell is a 73 y/o female w/ h/o NASH cirrhosis w/ recent hospitalization 9/8-9/10 for hepatic encephalopathy w/ elevated ammonia levels and AKI, as well as h/o HTN, DM, stage III CKD and h/o diverticular bleed, now presenting back to the ED w/ CC of chest pain. Cardiology consulted for CP evaluation at the request of Dr. Langston Masker, Emergency medicine.   History of Present Illness:   [name] is a 73 y/o female w/ h/o NASH cirrhosis w/ recent hospitalization 9/8-9/10 for hepatic encephalopathy w/ elevated ammonia levels and AKI, as well as h/o HTN, T2DM, stage III CKD and h/o diverticular bleed, now presenting back to the ED w/ CC of chest pain. She has not had an EGD. Not know if any esophageal varies. She is adopted and unsure of her family history. Former smoker.   Per chart review, pt was referred to cardiology affiliated w/ [facility] in 09/2017 for evaluation for heart murmur and PVCs. Per documentation, cardiologist that saw her felt her murmur was likely a flow murmur. 2D echo was ordered to r/o significant valvular dysfunction. Echo showed no significant valvular abnormalities and normal LVEF. Metoprolol was continued for PVCs.  As noted above, she was recently hospitalized for hepatic encephalopathy. Treated w/ lactulose w/ improvement in symptoms. RUQ [place] consistent w/ cirrhosis no masses. IVFs given for AKI. SCr improved from 1.59>>1.3. Pt discharged home.   A few days after d/c, she developed intermitted substernal/ mid epigastric chest pain. Feels like chest tightness. She has had symptoms at rest, also worse with exertion. Sometimes worse after meals. Yesterday, while at the  dentist, she had an intense episode of CP with radiation to the jaw. She was encouraged to go to the ED but pt decline. She had recurrent CP today and came to the ED.   Initial HS troponin elevated at 69. 2nd troponin trending down at 57. EKGs show NSR w/ slight inferior ST elevations. EDP ordered IV heparin and IV nitro. Labs show mild anemia. Hgb 10.6 (11.3 prior admission). BMP shows mild renal insuffiency w/ SCr at 1.15. BP moderately elevated at 153/63. She seems a bit confused today.   Heart Pathway Score:     Past Medical History:  Diagnosis Date  . Diabetes mellitus without complication ([place])   . Hypertension   . NASH (nonalcoholic steatohepatitis)     Past Surgical History:  Procedure Laterality Date  . ABDOMINAL HYSTERECTOMY    . CHOLECYSTECTOMY    . HERNIA REPAIR       Home Medications:  Prior to Admission medications   Medication Sig Start Date End Date Taking? Authorizing Provider  amLODipine (NORVASC) 10 MG tablet Take 10 mg by mouth daily. 09/19/17   [provider]  Apoaequorin (PREVAGEN EXTRA STRENGTH) 20 MG CAPS Take 20 mg by mouth daily.    [provider]  ARIPiprazole (ABILIFY) 10 MG tablet Take 10 mg by mouth at bedtime.    [provider]  buPROPion (WELLBUTRIN XL) 300 MG 24 hr tablet Take 300 mg by mouth daily. 04/15/19   [provider]  Calcium 500-125 MG-UNIT TABS Take 2 tablets by mouth daily.    [provider]  clobetasol cream (TEMOVATE) 1.95 % Apply 1 application topically  daily as needed for rash. 11/01/16   [provider]  diclofenac sodium (VOLTAREN) 1 % GEL Apply 2 g topically 2 (two) times daily as needed for pain. 09/12/17   [provider]  diphenoxylate-atropine (LOMOTIL) 2.5-0.025 MG tablet Take 2 tablets by mouth 4 (four) times daily. 11/19/17   [provider]  ezetimibe (ZETIA) 10 MG tablet Take 10 mg by mouth every morning. 08/05/18   [provider]  ferrous sulfate  325 (65 FE) MG EC tablet Take 325 mg by mouth 2 (two) times daily. 04/18/19   [provider]  FLUoxetine (PROZAC) 40 MG capsule Take 40 mg by mouth daily.    [provider]  gabapentin (NEURONTIN) 100 MG capsule Take 200 mg by mouth daily. 02/19/18   [provider]  metoprolol tartrate (LOPRESSOR) 25 MG tablet Take 25 mg by mouth 2 (two) times daily. 08/01/17   [provider]  omeprazole (PRILOSEC) 40 MG capsule Take 40 mg by mouth daily. 12/22/14   [provider]  Polyethyl Glycol-Propyl Glycol (SYSTANE) 0.4-0.3 % SOLN Place 1 drop into both eyes 2 (two) times daily.    [provider]  rifaximin (XIFAXAN) 550 MG TABS tablet Take 1 tablet (550 mg total) by mouth 2 (two) times daily. 05/15/19   Al Decant, MD  SitaGLIPtin-MetFORMIN HCl (JANUMET XR) [phone] MG TB24 Take 1 tablet by mouth every evening. 11/05/17   [provider]  telmisartan-hydrochlorothiazide (MICARDIS HCT) 80-25 MG tablet Take 1 tablet by mouth daily. 10/01/17   [provider]    Inpatient Medications: Scheduled Meds:  Continuous Infusions: . heparin    . nitroGLYCERIN 5 mcg/min (05/22/19 1717)   PRN Meds: morphine injection, nitroGLYCERIN, ondansetron  Allergies:    Allergies  Allergen Reactions  . Acetaminophen     Other reaction(s): Other Patient avoids Tylenol due to fatty liver disease  . Cefazolin Other (See Comments)    Other reaction(s): Other caused C-DIF caused C-DIF c dif caused C-DIF   . Ciprofloxacin Hcl Nausea Only  . Ibuprofen     Other reaction(s): Other Diabetic--Concerns about kidneys  . Sulfa Antibiotics     Other reaction(s): Hives  . Naproxen Sodium     Causes pain in veins  . Amoxicillin Diarrhea    Other reaction(s): Diarrhea    Social History:   Social History   Socioeconomic History  . Marital status: Married    Spouse name: Not on file  . Number of children: Not on file  . Years of education:  Not on file  . Highest education level: Not on file  Occupational History  . Not on file  Social Needs  . Financial resource strain: Not on file  . Food insecurity    Worry: Not on file    Inability: Not on file  . Transportation needs    Medical: Not on file    Non-medical: Not on file  Tobacco Use  . Smoking status: Never Smoker  . Smokeless tobacco: Never Used  Substance and Sexual Activity  . Alcohol use: Never    Frequency: Never    Comment: rarely  . Drug use: Never  . Sexual activity: Not on file  Lifestyle  . Physical activity    Days per week: Not on file    Minutes per session: Not on file  . Stress: Not on file  Relationships  . Social Herbalist on phone: Not on file    Gets together: Not on  file    Attends religious service: Not on file    Active member of club or organization: Not on file    Attends meetings of clubs or organizations: Not on file    Relationship status: Not on file  . Intimate partner violence    Fear of current or ex partner: Not on file    Emotionally abused: Not on file    Physically abused: Not on file    Forced sexual activity: Not on file  Other Topics Concern  . Not on file  Social History Narrative  . Not on file    Family History:   Family History  Adopted: Yes  Family history is unknown    ROS:  Please see the history of present illness.   All other ROS reviewed and negative.     Physical Exam/Data:   Vitals:   05/22/19 1415 05/22/19 1445 05/22/19 1515 05/22/19 1715  BP: (!) 118/54 136/64 (!) 153/63 (!) 124/107  Pulse: 67  61 64  Resp: 19 (!) 21 (!) 22 20  Temp:      TempSrc:      SpO2: 92%  95% 93%  Weight:      Height:       No intake or output data in the 24 hours ending 05/22/19 1718 Last 3 Weights 05/22/2019 05/13/2019 05/13/2019  Weight (lbs) 177 lb 181 lb 14.1 oz 176 lb  Weight (kg) 80.287 kg 82.5 kg 79.833 kg     Body mass index is 28.57 kg/m.  General:  Well nourished, well developed, in  no acute distress HEENT: normal Lymph: no adenopathy Neck: no JVD Endocrine:  No thryomegaly Vascular: No carotid bruits; FA pulses 2+ bilaterally without bruits  Cardiac:  normal S1, S2; RRR; 2/6 murmur at RUSB Lungs:  clear to auscultation bilaterally, no wheezing, rhonchi or rales  Abd: soft, nontender, no hepatomegaly  Ext: no edema Musculoskeletal:  No deformities, BUE and BLE strength normal and equal Skin: warm and dry  Neuro:  CNs 2-12 intact, no focal abnormalities noted Psych:  Normal affect   EKG:  The EKG was personally reviewed and demonstrates:  NSR with slight ST elevations in inferior leads Telemetry:  Telemetry was personally reviewed and demonstrates:  NSR. HR in the 80s   Relevant CV Studies: 2D Echo 05/14/19 1. The left ventricle has hyperdynamic systolic function, with an ejection fraction of >65%. The cavity size was normal. There is mildly increased left ventricular wall thickness. Left ventricular diastolic Doppler parameters are consistent with impaired relaxation. Indeterminate filling pressures The E/e' is 8-15. No evidence of left ventricular regional wall motion abnormalities. 2. The right ventricle has normal systolic function. The cavity was mildly enlarged. There is no increase in right ventricular wall thickness. 3. Left atrial size was mildly dilated. 4. Right atrial size was moderately dilated. 5. The aortic valve is tricuspid. No stenosis of the aortic valve. Mild aortic annular calcification noted. 6. The mitral valve is abnormal. Mild thickening of the mitral valve leaflet. There is mild mitral annular calcification present. 7. The tricuspid valve is grossly normal. 8. The aorta is normal unless otherwise noted. 9. The inferior vena cava was dilated in size with <50% respiratory variability. 10. When compared to the prior study: 09/20/2017 [facility]) - LVEF>55%, no aortic stenosis (mean gradient 5 mmHg).  Laboratory Data:   High Sensitivity Troponin:   Recent Labs  Lab 05/22/19 1445 05/22/19 1546  TROPONINIHS 69* 57*     Chemistry  Recent Labs  Lab 05/22/19 1445  NA 139  K 3.7  CL 107  CO2 24  GLUCOSE 172*  BUN 15  CREATININE 1.15*  CALCIUM 9.5  GFRNONAA 47*  GFRAA 54*  ANIONGAP 8    Recent Labs  Lab 05/22/19 1445  PROT 7.4  ALBUMIN 3.3*  AST 45*  ALT 25  ALKPHOS 104  BILITOT 1.2   Hematology Recent Labs  Lab 05/22/19 1445  WBC 6.3  RBC 3.62*  HGB 10.6*  HCT 34.6*  MCV 95.6  MCH 29.3  MCHC 30.6  RDW 18.6*  PLT 160   BNPNo results for input(s): BNP, PROBNP in the last 168 hours.  DDimer No results for input(s): DDIMER in the last 168 hours.   Radiology/Studies:  Dg Chest Port 1 View  Result Date: 05/22/2019 CLINICAL DATA:  Mid chest pain right jaw and 1 day. Dizziness and nausea. Some air. EXAM: PORTABLE CHEST 1 VIEW COMPARISON:  05/13/2019 FINDINGS: Cardiac silhouette is normal in size. No mediastinal or hilar masses and no evidence of adenopathy. Is Mild prominence of the bronchovascular markings bilaterally stable from prior exam. Lungs otherwise clear. No convincing pleural effusion or pneumothorax. Skeletal structures are grossly intact. IMPRESSION: No acute cardiopulmonary disease. Electronically Signed   By: Lajean Manes M.D.   On: 05/22/2019 15:13    Assessment and Plan:   [name] is a 73 y/o female w/ h/o NASH cirrhosis w/ recent hospitalization 9/8-9/10 for hepatic encephalopathy w/ elevated ammonia levels and AKI, as well as h/o HTN, DM, stage III CKD and h/o diverticular bleed, now presenting back to the ED w/ CC of chest pain. Cardiology consulted for CP evaluation at the request of Dr. Langston Masker, Emergency medicine.   1. Chest Pain: mixed typical and atypical features. Intermitted CP for the last 2-3 days. Described as chest pressure/tightness. Occurs at rest. Exacerbated by both exertion and meals. EKG w/ slight ST elevations in inferior leads and Hs troponin  abnormal at  69>>57. Slight active CP in the ED.   Agree w/ starting IV heparin + IV nitro.   NPO at midnight for LHC in the AM.   Continue home  blocker  Add ASA 81  Had recent echo done last week. EF normal. No significant valvular disease.   Will repeat limited echo to ensure no interval change given new CP. Reassess LVF.   2. NASH Cirrhosis: appears a bit confused in the ED. Need ammonia levels rechecked. Recently admitted for hepatic encephalopathy, requiring lactulose.   Will defer treatment to IM  3. HTN: BP elevated 124/107. Continue home metoprolol Continue IV nitro Monitor   4. DM: management per IM     For questions or updates, please contact [place] Please consult [email or web address] for contact info under     Signed, Lyda Jester, PA-C  05/22/2019 5:18 PM

## 2019-05-22 NOTE — ED Notes (Signed)
Walked patient to the bedroom

## 2019-05-22 NOTE — ED Provider Notes (Signed)
[place] EMERGENCY DEPARTMENT Provider Note   CSN: [id number] Arrival date & time: 05/22/19  1229     History   Chief Complaint Chief Complaint  Patient presents with  . Chest Pain    HPI Sheila [name] is a 73 y.o. female.     Patient with history of diabetes, hypertension, lipidemia, recent admission for encephalopathy due to [facility] and AKI --presents to the emergency department with complaint of chest pain.  Pain started acutely with radiation to her jaw yesterday while at the dentist office.  She was having a procedure done.  She told the staff who she states were worried about her but she decided to go home because she was just in the hospital.  She describes it as a very intense pain across her chest.  Currently 7 out of 10.  Patient had difficulty sleeping last night because of the discomfort.  She decided to come to the emergency department today and was transported by EMS.  No significant shortness of breath.  No diaphoresis or vomiting however she has felt nauseous.  No abdominal pain.  No weakness, numbness, or tingling in her arms or legs.  No radiation to the back.  She is unable to take aspirin due to GI bleeding.  No nitroglycerin given prior to arrival.     Past Medical History:  Diagnosis Date  . Diabetes mellitus without complication ([place])   . Hypertension   . NASH (nonalcoholic steatohepatitis)     Patient Active Problem List   Diagnosis Date Noted  . Hepatic encephalopathy ([place])   . Liver cirrhosis ([place])   . NASH (nonalcoholic steatohepatitis)   . Weakness 05/13/2019  . AKI (acute kidney injury) ([place]) 05/13/2019    Past Surgical History:  Procedure Laterality Date  . ABDOMINAL HYSTERECTOMY    . CHOLECYSTECTOMY    . HERNIA REPAIR       OB History   No obstetric history on file.      Home Medications    Prior to Admission medications   Medication Sig Start Date End Date Taking? Authorizing Provider  amLODipine (NORVASC) 10  MG tablet Take 10 mg by mouth daily. 09/19/17   [provider]  Apoaequorin (PREVAGEN EXTRA STRENGTH) 20 MG CAPS Take 20 mg by mouth daily.    [provider]  ARIPiprazole (ABILIFY) 10 MG tablet Take 10 mg by mouth at bedtime.    [provider]  buPROPion (WELLBUTRIN XL) 300 MG 24 hr tablet Take 300 mg by mouth daily. 04/15/19   [provider]  Calcium 500-125 MG-UNIT TABS Take 2 tablets by mouth daily.    [provider]  clobetasol cream (TEMOVATE) [id number] % Apply 1 application topically daily as needed for rash. 11/01/16   [provider]  diclofenac sodium (VOLTAREN) 1 % GEL Apply 2 g topically 2 (two) times daily as needed for pain. 09/12/17   [provider]  diphenoxylate-atropine (LOMOTIL) 2.5-0.025 MG tablet Take 2 tablets by mouth 4 (four) times daily. 11/19/17   [provider]  ezetimibe (ZETIA) 10 MG tablet Take 10 mg by mouth every morning. 08/05/18   [provider]  ferrous sulfate 325 (65 FE) MG EC tablet Take 325 mg by mouth 2 (two) times daily. 04/18/19   [provider]  FLUoxetine (PROZAC) 40 MG capsule Take 40 mg by mouth daily.    [provider]  gabapentin (NEURONTIN) 100 MG capsule Take 200 mg by mouth daily. 02/19/18   [provider]  metoprolol tartrate (LOPRESSOR) 25 MG tablet Take 25 mg by mouth 2 (two) times daily. 08/01/17   [provider]  omeprazole (PRILOSEC) 40 MG capsule Take 40 mg by mouth daily. 12/22/14   [provider]  Polyethyl Glycol-Propyl Glycol (SYSTANE) 0.4-0.3 % SOLN Place 1 drop into both eyes 2 (two) times daily.    [provider]  rifaximin (XIFAXAN) 550 MG TABS tablet Take 1 tablet (550 mg total) by mouth 2 (two) times daily. 05/15/19   Al Decant, MD  SitaGLIPtin-MetFORMIN HCl (JANUMET XR) [id number] MG TB24 Take 1 tablet by mouth every evening. 11/05/17   [provider]  telmisartan-hydrochlorothiazide  (MICARDIS HCT) 80-25 MG tablet Take 1 tablet by mouth daily. 10/01/17   [provider]    Family History No family history on file.  Social History Social History   Tobacco Use  . Smoking status: Never Smoker  . Smokeless tobacco: Never Used  Substance Use Topics  . Alcohol use: Never    Frequency: Never    Comment: rarely  . Drug use: Never     Allergies   Acetaminophen, Cefazolin, Ciprofloxacin hcl, Ibuprofen, Sulfa antibiotics, Naproxen sodium, and Amoxicillin   Review of Systems Review of Systems  Constitutional: Positive for fatigue. Negative for diaphoresis and fever.  Eyes: Negative for redness.  Respiratory: Negative for cough and shortness of breath.   Cardiovascular: Positive for chest pain. Negative for palpitations and leg swelling.  Gastrointestinal: Positive for nausea. Negative for abdominal pain and vomiting.  Genitourinary: Negative for dysuria.  Musculoskeletal: Negative for back pain and neck pain.  Skin: Negative for rash.  Neurological: Negative for syncope and light-headedness.  Psychiatric/Behavioral: The patient is not nervous/anxious.      Physical Exam Updated Vital Signs BP (!) 117/57 (BP Location: Left Arm)   Pulse 65   Temp 98.3 F (36.8 C) (Oral)   Resp 20   Ht 5\' 6"  (1.676 m)   Wt 80.3 kg   SpO2 96%   BMI 28.57 kg/m   Physical Exam Vitals signs and nursing note reviewed.  Constitutional:      Appearance: She is well-developed. She is not diaphoretic.  HENT:     Head: Normocephalic and atraumatic.     Mouth/Throat:     Mouth: Mucous membranes are not dry.  Eyes:     Conjunctiva/sclera: Conjunctivae normal.  Neck:     Musculoskeletal: Normal range of motion and neck supple. No muscular tenderness.     Vascular: Normal carotid pulses. No carotid bruit or JVD.     Trachea: Trachea normal. No tracheal deviation.  Cardiovascular:     Rate and Rhythm: Normal rate and regular rhythm.     Pulses: No decreased pulses.      Heart sounds: Normal heart sounds, S1 normal and S2 normal. No murmur.  Pulmonary:     Effort: Pulmonary effort is normal. No respiratory distress.     Breath sounds: No wheezing.  Chest:     Chest wall: No tenderness.  Abdominal:     General: Bowel sounds are normal.     Palpations: Abdomen is soft.     Tenderness: There is no abdominal tenderness. There is no guarding or rebound.  Musculoskeletal: Normal range of motion.  Skin:    General: Skin is warm and dry.     Coloration: Skin is not pale.  Neurological:     Mental Status: She is alert.      ED Treatments / Results  Labs (all labs ordered are listed, but only abnormal results are displayed) Labs Reviewed  CBC - Abnormal; Notable for the following components:      Result Value   RBC 3.62 (*)    Hemoglobin 10.6 (*)    HCT 34.6 (*)    RDW 18.6 (*)    All other components within normal limits  COMPREHENSIVE METABOLIC PANEL - Abnormal; Notable for the following components:   Glucose, Bld 172 (*)    Creatinine, Ser 1.15 (*)    Albumin 3.3 (*)    AST 45 (*)    GFR calc non Af Amer 47 (*)    GFR calc Af Amer 54 (*)    All other components within normal limits  TROPONIN I (HIGH SENSITIVITY) - Abnormal; Notable for the following components:   Troponin I (High Sensitivity) 69 (*)    All other components within normal limits  SARS CORONAVIRUS 2 (TAT 6-24 HRS)  TROPONIN I (HIGH SENSITIVITY)    EKG EKG Interpretation  Date/Time:  Thursday May 22 2019 13:38:17 EDT Ventricular Rate:  66 PR Interval:    QRS Duration: 106 QT Interval:  437 QTC Calculation: 458 R Axis:   73 Text Interpretation:  Sinus rhythm No STEMI Confirmed by Octaviano Glow [phone]) on 05/22/2019 1:47:03 PM   Radiology Dg Chest Port 1 View  Result Date: 05/22/2019 CLINICAL DATA:  Mid chest pain right jaw and 1 day. Dizziness and nausea. Some air. EXAM: PORTABLE CHEST 1 VIEW COMPARISON:  05/13/2019 FINDINGS: Cardiac silhouette is normal  in size. No mediastinal or hilar masses and no evidence of adenopathy. Is Mild prominence of the bronchovascular markings bilaterally stable from prior exam. Lungs otherwise clear. No convincing pleural effusion or pneumothorax. Skeletal structures are grossly intact. IMPRESSION: No acute cardiopulmonary disease. Electronically Signed   By: Lajean Manes M.D.   On: 05/22/2019 15:13    Procedures Procedures (including critical care time)  Medications Ordered in ED Medications  nitroGLYCERIN (NITROSTAT) SL tablet 0.4 mg (0.4 mg Sublingual Given 05/22/19 1421)     Initial Impression / Assessment and Plan / ED Course  I have reviewed the triage vital signs and the nursing notes.  Pertinent labs & imaging results that were available during my care of the patient were reviewed by me and considered in my medical decision making (see chart for details).        Patient seen and examined.  Initial EKG is poor quality but with questionable inferior ST segment abnormality.  Repeat EKG is clear without signs of STEMI.  Discussed with Dr. Langston Masker.  Work-up initiated. Medications ordered.  Patient has poor access.  Vital signs reviewed and are as follows: BP (!) 117/57 (BP Location: Left Arm)   Pulse 65   Temp 98.3 F (36.8 C) (Oral)   Resp 20   Ht 5\' 6"  (1.676 m)   Wt 80.3 kg   SpO2 96%   BMI 28.57 kg/m   3:32 PM IV team established access.  Patient rechecked after nitroglycerin.  She states that her pain eased off however she continues to complain of 7/10 chest discomfort.  I asked her if she wants anything additional for her pain and she declines.  Awaiting troponin.  4:18 PM Discussed with cardiology. They will consult. They asked that I admit to medicine. Will speak with internal medicine who admitted patient earlier in the month.   4:31 PM Spoke with IMTS. They will see.   Final Clinical Impressions(s) / ED Diagnoses  Final diagnoses:  Precordial pain  Elevated troponin   Admit  for chest pain with risk factors, mildly elevated troponin.  ED Discharge Orders    None       Carlisle Cater, Hershal Coria 05/22/19 1631    Wyvonnia Dusky, MD 05/22/19 [phone]

## 2019-05-22 NOTE — ED Notes (Signed)
Patient is now back in bed on the monitor did a repeat ekg patient is resting with call bell in reach and family at bedside

## 2019-05-23 ENCOUNTER — Inpatient Hospital Stay (HOSPITAL_COMMUNITY): Payer: Medicare Other

## 2019-05-23 ENCOUNTER — Encounter (HOSPITAL_COMMUNITY)
Admission: EM | Disposition: A | Discharge status: Home / Self Care | Payer: Self-pay | Source: Home / Self Care | Attending: Internal Medicine

## 2019-05-23 DIAGNOSIS — E785 Hyperlipidemia, unspecified: Secondary | ICD-10-CM

## 2019-05-23 DIAGNOSIS — I214 Non-ST elevation (NSTEMI) myocardial infarction: Secondary | ICD-10-CM | POA: Diagnosis present

## 2019-05-23 DIAGNOSIS — I129 Hypertensive chronic kidney disease with stage 1 through stage 4 chronic kidney disease, or unspecified chronic kidney disease: Secondary | ICD-10-CM

## 2019-05-23 DIAGNOSIS — K7290 Hepatic failure, unspecified without coma: Principal | ICD-10-CM

## 2019-05-23 DIAGNOSIS — Z79899 Other long term (current) drug therapy: Secondary | ICD-10-CM

## 2019-05-23 DIAGNOSIS — K7460 Unspecified cirrhosis of liver: Secondary | ICD-10-CM

## 2019-05-23 DIAGNOSIS — R079 Chest pain, unspecified: Secondary | ICD-10-CM

## 2019-05-23 DIAGNOSIS — R011 Cardiac murmur, unspecified: Secondary | ICD-10-CM

## 2019-05-23 DIAGNOSIS — E7800 Pure hypercholesterolemia, unspecified: Secondary | ICD-10-CM

## 2019-05-23 DIAGNOSIS — E1122 Type 2 diabetes mellitus with diabetic chronic kidney disease: Secondary | ICD-10-CM

## 2019-05-23 DIAGNOSIS — N183 Chronic kidney disease, stage 3 (moderate): Secondary | ICD-10-CM

## 2019-05-23 LAB — C-REACTIVE PROTEIN: CRP: 7.5 mg/dL — ABNORMAL HIGH (ref <1.0)

## 2019-05-23 LAB — COMPREHENSIVE METABOLIC PANEL
ALT: 24 U/L (ref 0–44)
AST: 35 U/L (ref 15–41)
Albumin: 3.2 g/dL — ABNORMAL LOW (ref 3.5–5.0)
Alkaline Phosphatase: 86 U/L (ref 38–126)
Anion gap: 10 (ref 5–15)
BUN: 17 mg/dL (ref 8–23)
CO2: 21 mmol/L — ABNORMAL LOW (ref 22–32)
Calcium: 9.2 mg/dL (ref 8.9–10.3)
Chloride: 107 mmol/L (ref 98–111)
Creatinine, Ser: 1.2 mg/dL — ABNORMAL HIGH (ref 0.44–1.00)
GFR calc Af Amer: 52 mL/min — ABNORMAL LOW (ref >60)
GFR calc non Af Amer: 44 mL/min — ABNORMAL LOW (ref >60)
Glucose, Bld: 129 mg/dL — ABNORMAL HIGH (ref 70–99)
Potassium: 3.7 mmol/L (ref 3.5–5.1)
Sodium: 138 mmol/L (ref 135–145)
Total Bilirubin: 1.4 mg/dL — ABNORMAL HIGH (ref 0.3–1.2)
Total Protein: 7.2 g/dL (ref 6.5–8.1)

## 2019-05-23 LAB — IRON AND TIBC
Iron: 44 ug/dL (ref 28–170)
Saturation Ratios: 10 % — ABNORMAL LOW (ref 10.4–31.8)
TIBC: 451 ug/dL — ABNORMAL HIGH (ref 250–450)
UIBC: 407 ug/dL

## 2019-05-23 LAB — CBC
HCT: 33.6 % — ABNORMAL LOW (ref 36.0–46.0)
Hemoglobin: 10.5 g/dL — ABNORMAL LOW (ref 12.0–15.0)
MCH: 29.6 pg (ref 26.0–34.0)
MCHC: 31.3 g/dL (ref 30.0–36.0)
MCV: 94.6 fL (ref 80.0–100.0)
Platelets: 149 10*3/uL — ABNORMAL LOW (ref 150–400)
RBC: 3.55 MIL/uL — ABNORMAL LOW (ref 3.87–5.11)
RDW: 18.8 % — ABNORMAL HIGH (ref 11.5–15.5)
WBC: 7.1 10*3/uL (ref 4.0–10.5)
nRBC: 0 % (ref 0.0–0.2)

## 2019-05-23 LAB — D-DIMER, QUANTITATIVE: D-Dimer, Quant: 4.01 ug/mL-FEU — ABNORMAL HIGH (ref 0.00–0.50)

## 2019-05-23 LAB — RETICULOCYTES
Immature Retic Fract: 19.8 % — ABNORMAL HIGH (ref 2.3–15.9)
RBC.: 3.64 MIL/uL — ABNORMAL LOW (ref 3.87–5.11)
Retic Count, Absolute: 97.6 10*3/uL (ref 19.0–186.0)
Retic Ct Pct: 2.7 % (ref 0.4–3.1)

## 2019-05-23 LAB — HEPARIN LEVEL (UNFRACTIONATED)
Heparin Unfractionated: 0.28 IU/mL — ABNORMAL LOW (ref 0.30–0.70)
Heparin Unfractionated: 0.29 IU/mL — ABNORMAL LOW (ref 0.30–0.70)
Heparin Unfractionated: 0.29 IU/mL — ABNORMAL LOW (ref 0.30–0.70)
Heparin Unfractionated: 0.35 IU/mL (ref 0.30–0.70)

## 2019-05-23 LAB — FERRITIN: Ferritin: 46 ng/mL (ref 11–307)

## 2019-05-23 LAB — FOLATE: Folate: 24.3 ng/mL (ref >5.9)

## 2019-05-23 LAB — TROPONIN I (HIGH SENSITIVITY): Troponin I (High Sensitivity): 29 ng/L — ABNORMAL HIGH (ref <18)

## 2019-05-23 LAB — PROTIME-INR
INR: 1.2 (ref 0.8–1.2)
Prothrombin Time: 15.3 seconds — ABNORMAL HIGH (ref 11.4–15.2)

## 2019-05-23 LAB — ECHOCARDIOGRAM LIMITED
Height: 66 in
Weight: 2860.8 oz

## 2019-05-23 LAB — SARS CORONAVIRUS 2 (TAT 6-24 HRS): SARS Coronavirus 2: NEGATIVE

## 2019-05-23 LAB — AMMONIA: Ammonia: 31 umol/L (ref 9–35)

## 2019-05-23 LAB — APTT: aPTT: 75 seconds — ABNORMAL HIGH (ref 24–36)

## 2019-05-23 LAB — VITAMIN B12: Vitamin B-12: 184 pg/mL (ref 180–914)

## 2019-05-23 LAB — SEDIMENTATION RATE: Sed Rate: 64 mm/hr — ABNORMAL HIGH (ref 0–22)

## 2019-05-23 SURGERY — LEFT HEART CATH AND CORONARY ANGIOGRAPHY
Anesthesia: LOCAL

## 2019-05-23 MED ORDER — ASPIRIN 81 MG PO CHEW: 81.0000 mg | CHEWABLE_TABLET | ORAL | Status: AC

## 2019-05-23 MED ORDER — IOHEXOL 350 MG/ML SOLN
80.0000 mL | Freq: Once | INTRAVENOUS | Status: AC | PRN
  Administered 2019-05-23: 80 mL via INTRAVENOUS

## 2019-05-23 MED ORDER — ASPIRIN 81 MG PO CHEW: 81.0000 mg | CHEWABLE_TABLET | ORAL | Status: DC

## 2019-05-23 MED ORDER — BUPROPION HCL ER (XL) 150 MG PO TB24
300.0000 mg | ORAL_TABLET | Freq: Every day | ORAL | Status: DC
  Administered 2019-05-23 – 2019-05-26 (×4): 300 mg via ORAL
  Filled 2019-05-23 (×4): qty 2

## 2019-05-23 MED ORDER — PANTOPRAZOLE SODIUM 40 MG PO TBEC
40.0000 mg | DELAYED_RELEASE_TABLET | Freq: Every day | ORAL | Status: DC
  Administered 2019-05-23 – 2019-05-26 (×4): 40 mg via ORAL
  Filled 2019-05-23 (×4): qty 1

## 2019-05-23 MED ORDER — LACTULOSE 10 GM/15ML PO SOLN
20.0000 g | Freq: Three times a day (TID) | ORAL | Status: DC
  Administered 2019-05-23 – 2019-05-24 (×4): 20 g via ORAL
  Filled 2019-05-23 (×6): qty 30

## 2019-05-23 MED ORDER — ALUM & MAG HYDROXIDE-SIMETH 200-200-20 MG/5ML PO SUSP
30.0000 mL | Freq: Once | ORAL | Status: AC
  Administered 2019-05-23: 30 mL via ORAL
  Filled 2019-05-23: qty 30

## 2019-05-23 MED ORDER — SODIUM CHLORIDE 0.9% FLUSH: 3.0000 mL | Freq: Two times a day (BID) | INTRAVENOUS | Status: DC

## 2019-05-23 MED ORDER — EZETIMIBE 10 MG PO TABS
10.0000 mg | ORAL_TABLET | Freq: Every morning | ORAL | Status: DC
  Administered 2019-05-23 – 2019-05-26 (×4): 10 mg via ORAL
  Filled 2019-05-23 (×5): qty 1

## 2019-05-23 MED ORDER — FLUOXETINE HCL 20 MG PO CAPS
40.0000 mg | ORAL_CAPSULE | Freq: Every day | ORAL | Status: DC
  Administered 2019-05-23 – 2019-05-26 (×4): 40 mg via ORAL
  Filled 2019-05-23 (×5): qty 2

## 2019-05-23 MED ORDER — SODIUM CHLORIDE 0.9 % IV SOLN: 250.0000 mL | INTRAVENOUS | Status: DC | PRN

## 2019-05-23 MED ORDER — SODIUM CHLORIDE 0.9 % WEIGHT BASED INFUSION: 1.0000 mL/kg/h | INTRAVENOUS | Status: DC

## 2019-05-23 MED ORDER — ACETAMINOPHEN 325 MG PO TABS
650.0000 mg | ORAL_TABLET | Freq: Four times a day (QID) | ORAL | Status: DC | PRN
  Administered 2019-05-26: 15:00:00 650 mg via ORAL
  Filled 2019-05-23: qty 2

## 2019-05-23 MED ORDER — ISOSORBIDE MONONITRATE ER 30 MG PO TB24
30.0000 mg | ORAL_TABLET | Freq: Every day | ORAL | Status: DC
  Administered 2019-05-23 – 2019-05-25 (×3): 30 mg via ORAL
  Filled 2019-05-23 (×3): qty 1

## 2019-05-23 MED ORDER — SODIUM CHLORIDE 0.9 % WEIGHT BASED INFUSION: 3.0000 mL/kg/h | INTRAVENOUS | Status: DC

## 2019-05-23 MED ORDER — INFLUENZA VAC A&B SA ADJ QUAD 0.5 ML IM PRSY
0.5000 mL | PREFILLED_SYRINGE | INTRAMUSCULAR | Status: AC
  Administered 2019-05-24: 0.5 mL via INTRAMUSCULAR
  Filled 2019-05-23: qty 0.5

## 2019-05-23 MED ORDER — CLOBETASOL PROPIONATE 0.05 % EX CREA: 1.0000 "application " | TOPICAL_CREAM | Freq: Every day | CUTANEOUS | Status: DC | PRN

## 2019-05-23 MED ORDER — SODIUM CHLORIDE 0.9% FLUSH: 3.0000 mL | INTRAVENOUS | Status: DC | PRN

## 2019-05-23 NOTE — ED Notes (Signed)
Heparin increased to 1050units/hr per pharmacy.

## 2019-05-23 NOTE — Progress Notes (Addendum)
Progress Note  Patient Name: Sheila [name] Date of Encounter: 05/23/2019  Primary Cardiologist: Fransico Him, MD  Subjective   It is difficult to get a history out of her. She seems to be confabulating. She fails to answer the question asked but keeps on replying "they think there is something wrong but I'm telling you, there's not" or "I just want to go home." After several minutes she was able to tell me it's September 2020 but only after extensive pausing. She is unable to tell me where she is or why she came in. She is unable to tell me anything about having a heart cath. Denies any CP, SOB, nausea, leg pain or swelling.  Inpatient Medications    Scheduled Meds:  aspirin  81 mg Oral Pre-Cath   buPROPion  300 mg Oral Daily   ezetimibe  10 mg Oral q morning - 10a   FLUoxetine  40 mg Oral Daily   lactulose  20 g Oral TID   pantoprazole  40 mg Oral Daily   rifaximin  550 mg Oral BID   sodium chloride flush  3 mL Intravenous Q12H   Continuous Infusions:  sodium chloride     sodium chloride     heparin 1,050 Units/hr (05/23/19 0622)   nitroGLYCERIN 5 mcg/min (05/22/19 1717)   PRN Meds: sodium chloride, clobetasol cream, morphine injection, nitroGLYCERIN, ondansetron, senna-docusate, sodium chloride flush   Vital Signs    Vitals:   05/23/19 0500 05/23/19 0530 05/23/19 0615 05/23/19 0800  BP: 133/66 (!) 142/72 136/67 130/64  Pulse: 60 63 (!) 59 73  Resp: (!) 25 (!) 23 (!) 25 14  Temp:      TempSrc:      SpO2: 93% 94% 92% 91%  Weight:      Height:        Intake/Output Summary (Last 24 hours) at 05/23/2019 0832 Last data filed at 05/23/2019 0622 Gross per 24 hour  Intake 156.11 ml  Output --  Net 156.11 ml   Last 3 Weights 05/22/2019 05/13/2019 05/13/2019  Weight (lbs) 177 lb 181 lb 14.1 oz 176 lb  Weight (kg) 80.287 kg 82.5 kg 79.833 kg     Telemetry    NSR - occ PVCs yesterday but have resolved - Personally Reviewed  EKG reviewed from this AM - NSR  63bpm, inferior Q waves and similar subtle ST elevation II, III, avF, V5-V6 similar to yesterday  Physical Exam   GEN: No acute distress.  HEENT: Normocephalic, atraumatic, sclera non-icteric. Neck: No JVD or bruits. Cardiac: RRR no murmurs, rubs, or gallops.  Radials/DP/PT 1+ and equal bilaterally.  Respiratory: Clear to auscultation bilaterally. Breathing is unlabored. GI: Soft, nontender, non-distended, BS +x 4. MS: no deformity. Extremities: No clubbing or cyanosis. No edema. Distal pedal pulses are 2+ and equal bilaterally. Neuro:  As noted above - knows name and Sept 2020 but only after extensive pausing/thought. Does not know where she is or why she's here. Follows commands. Psych:  Concerned affect.  Labs    High Sensitivity Troponin:   Recent Labs  Lab 05/22/19 1445 05/22/19 1546  TROPONINIHS 69* 57*      Cardiac EnzymesNo results for input(s): TROPONINI in the last 168 hours. No results for input(s): TROPIPOC in the last 168 hours.   Chemistry Recent Labs  Lab 05/22/19 1445 05/23/19 0331  NA 139 138  K 3.7 3.7  CL 107 107  CO2 24 21*  GLUCOSE 172* 129*  BUN 15 17  CREATININE 1.15*  1.20*  CALCIUM 9.5 9.2  PROT 7.4 7.2  ALBUMIN 3.3* 3.2*  AST 45* 35  ALT 25 24  ALKPHOS 104 86  BILITOT 1.2 1.4*  GFRNONAA 47* 44*  GFRAA 54* 52*  ANIONGAP 8 10     Hematology Recent Labs  Lab 05/22/19 1445 05/23/19 0331  WBC 6.3 7.1  RBC 3.62* 3.55*  HGB 10.6* 10.5*  HCT 34.6* 33.6*  MCV 95.6 94.6  MCH 29.3 29.6  MCHC 30.6 31.3  RDW 18.6* 18.8*  PLT 160 149*    BNPNo results for input(s): BNP, PROBNP in the last 168 hours.   DDimer No results for input(s): DDIMER in the last 168 hours.   Radiology    Dg Chest Port 1 View  Result Date: 05/22/2019 CLINICAL DATA:  Mid chest pain right jaw and 1 day. Dizziness and nausea. Some air. EXAM: PORTABLE CHEST 1 VIEW COMPARISON:  05/13/2019 FINDINGS: Cardiac silhouette is normal in size. No mediastinal or hilar  masses and no evidence of adenopathy. Is Mild prominence of the bronchovascular markings bilaterally stable from prior exam. Lungs otherwise clear. No convincing pleural effusion or pneumothorax. Skeletal structures are grossly intact. IMPRESSION: No acute cardiopulmonary disease. Electronically Signed   By: Lajean Manes M.D.   On: 05/22/2019 15:13    Cardiac Studies   2d Echo 05/14/19  1. The left ventricle has hyperdynamic systolic function, with an ejection fraction of >65%. The cavity size was normal. There is mildly increased left ventricular wall thickness. Left ventricular diastolic Doppler parameters are consistent with  impaired relaxation. Indeterminate filling pressures The E/e' is 8-15. No evidence of left ventricular regional wall motion abnormalities.  2. The right ventricle has normal systolic function. The cavity was mildly enlarged. There is no increase in right ventricular wall thickness.  3. Left atrial size was mildly dilated.  4. Right atrial size was moderately dilated.  5. The aortic valve is tricuspid. No stenosis of the aortic valve. Mild aortic annular calcification noted.  6. The mitral valve is abnormal. Mild thickening of the mitral valve leaflet. There is mild mitral annular calcification present.  7. The tricuspid valve is grossly normal.  8. The aorta is normal unless otherwise noted.  9. The inferior vena cava was dilated in size with <50% respiratory variability. 10. When compared to the prior study: 09/20/2017 [facility]) - LVEF>55%, no aortic stenosis (mean gradient 5 mmHg).   Patient Profile     73 y.o. female w/ h/o NASH cirrhosis w/ recent hospitalization 9/8-9/10 for new encephalopathy felt hepatic in nature with elevated ammonia levels plus AKI, also h/o PVCs, heart murmur (prior echo without significant abnormality), HTN, DM, stage III CKD, prior diverticular bleed. During admission 05/2019, she had CT head showing cerebral white matter  hypodensity felt due to small vessel disease and suggestion of disproportionate biparetal cerebral volume loss, nonspecific, but can be seen with dementia. 2D echo 05/14/19 showed EF >65%, impaired diastolic relaxation, mildly enlarged RV with normal function, mild LAE, moderate RAE, dilated IVC that does not collapse, RVSP 65mg, mild aortic annular calcification, no pericardial effusion. [facility] and elevated hsTroponin.  Assessment & Plan    1. Chest pain/elevated troponin (downtrending) - EKG last night showed inferior Q waves and subtle ST elevation II, III, avF, V5-V6. The inferior Q waves were present last admission, but ST changes looked new from EKG last week. Of note, echo last admission showed dilated IVC  that did not collapse. The initial plan was for cath today and limited echo. However, the patient is confused and unable to consent for the procedure. She insists that there is nothing wrong with her and she just wants to go home. She is not having any more chest pain. I spoke with Dr. Radford Pax and therefore we will hold off on cath, can revisit when mental status is more clear. I spoke with cath lab to cancel procedure. Of note, her initial EKG last admission interestingly showed an S1Q3T3 pattern. On telemetry she is having occasional desats into the high 80's. The patient obviously is unable to tell us if this was perhaps during sleeping. She does have nail polish on as well so unclear if contributing, since other sats have been normal. Given chest pain, elevated troponin, abnormal EKG, dilated IVC on echo and the desats, will also check d-dimer. If positive would suggest pursuing CTA vs VQ to r/o PE. Continue heparin for now. I will discuss transition of NTG gtt to Imdur to Dr. Radford Pax. Will also get CRP + ESR given mild diffuse ST elevation as well as f/u troponin. Await limited echo as well. She is NPO for now,  pending definitive MD assessment.  2. HTN - BP running 871-959 systolic range presently. Can adjust if needed based on cath report. She was on beta blocker at home but baseline HR is in the 50s at times so would hold off. Her home medicines have not been reconciled otherwise by internal medicine so await their review.  3. NASH cirrhosis with recent hospitalization for hepatic encephalopathy - CT head at that time also suggested changes that might be consistent with dementia. Confusion noted.She needs a f/u ammonia level given persistent confusion. I have ordered this and appreciate primary team's management. She is also noted to have anemia. Will add anemia panel to include B12/folate in case this is contributing to mental status.  4. Stage III CKD - Cr appears stable compared to recent admission.  For questions or updates, please contact [place] Please consult [email or web address] for contact info under Cardiology/STEMI.  Signed, Charlie Pitter, PA-C 05/23/2019, 8:32 AM

## 2019-05-23 NOTE — Progress Notes (Signed)
  Date: 05/23/2019  Patient name: Sheila [name]  Medical record number: [id number]  Date of birth: Dec 21, 1945   I have seen and evaluated this patient and I have discussed the plan of care with the house staff. Please see their note for complete details. I concur with their findings with the following additions/corrections:   73 yo woman with T2DM, HTN, HLD, and cirrhosis due to NASH c/b hepatic encephalopathy presenting with chest pain. History is very difficult as she is quite confused. Per report, she had been having chest pain with exertion and at rest, and had an episode of chest pain while at the dentist's. She was encouraged to go to the ED, declined at the time, but eventually came because of another episode of pain.  On exam, she appears comfortable but confused. 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 She just asks to go home. Cardiac exam is normal with no murmurs or JVD, abdomen is distended but soft and non-tender, lungs are clear. She has asterixis.  Significant test results include: Troponin 69, 57 EKG shows borderline ST elevation in II, III, and aVF CXR with no acute findings  In summary, this is a 73 yo woman with T2DM, HTN, HLD, and cirrhosis admitted with chest pain and altered mental status. Chest pain with elevated troponin and EKG changes consistent with NSTEMI, started on heparin gtt, cardiology initially planned LHC today to evaluate, but she is very confused and unable to consent to procedure. AMS appears due to hepatic encephalopathy. She is on rifaximin, but unclear if she is taking.   Will start treatment of hepatic encephalopathy, hopefully mentation will improve and can proceed with cardiac evaluation.   Lenice Pressman, M.D., Ph.D. 05/23/2019, 11:00 AM

## 2019-05-23 NOTE — Progress Notes (Signed)
ANTICOAGULATION CONSULT NOTE - Follow Up Consult  Pharmacy Consult for heparin Indication: chest pain/ACS  Allergies  Allergen Reactions  . Acetaminophen     Other reaction(s): Other Patient avoids Tylenol due to fatty liver disease  . Cefazolin Other (See Comments)    Other reaction(s): Other caused C-DIF caused C-DIF c dif caused C-DIF   . Ciprofloxacin Hcl Nausea Only  . Ibuprofen     Other reaction(s): Other Diabetic--Concerns about kidneys  . Sulfa Antibiotics Hives  . Naproxen Sodium     Causes pain in veins  . Amoxicillin Diarrhea    Patient Measurements: Height: 5\' 6"  (167.6 cm) Weight: 178 lb 12.8 oz (81.1 kg) IBW/kg (Calculated) : 59.3 Heparin Dosing Weight:  76 kg  Vital Signs: Temp: 100.1 F (37.8 C) (09/18 0955) Temp Source: Oral (09/18 0955) BP: 134/48 (09/18 0955) Pulse Rate: 63 (09/18 0955)  Labs: Recent Labs    05/22/19 1445 05/22/19 1546 05/23/19 0018 05/23/19 0331 05/23/19 0500 05/23/19 1039 05/23/19 1250  HGB 10.6*  --   --  10.5*  --   --   --   HCT 34.6*  --   --  33.6*  --   --   --   PLT 160  --   --  149*  --   --   --   APTT  --   --   --  75*  --   --   --   LABPROT  --   --   --  15.3*  --   --   --   INR  --   --   --  1.2  --   --   --   HEPARINUNFRC  --   --  0.35  --  0.28*  --  0.29*  CREATININE 1.15*  --   --  1.20*  --   --   --   TROPONINIHS 69* 57*  --   --   --  29*  --     Estimated Creatinine Clearance: 44.2 mL/min (A) (by C-G formula based on SCr of 1.2 mg/dL (H)).  Assessment: Anticoag: Heparin - ACS/Chest pain. History of GI bleed however no reports of a current bleed. No anticoagulation PTA. Trop 69 > 57.  - HL 0.28>>0.29 on 900 units/hr. No IV access issues, confirmed rate w/ nursing. Hgb 10.6. Plt 149   Goal of Therapy:  Heparin level 0.3-0.7 units/ml Monitor platelets by anticoagulation protocol: Yes   Plan:  Inc heparin to 1250 units/hr. Recheck in 6-8 hrs. Daily HL and CBC No cath today due to  mental status   Janos Shampine S. Alford Highland, PharmD, BCPS Clinical Staff Pharmacist Eilene Ghazi Stillinger 05/23/2019,2:19 PM

## 2019-05-23 NOTE — Progress Notes (Signed)
Paged that the patient was febrile to 101.75F. Presented to bedside for further evaluation. Patient voices frustration about being hospitalized. She knows she is in the hospital but does not know which hospital she is at. She does not know the year. She is very confused currently. Denies polyuria, dysuria, or abdominal pain. She does feel SHOB but denies cough.   Today's Vitals   05/23/19 0900 05/23/19 0918 05/23/19 0955 05/23/19 2058  BP: (!) 143/63  (!) 134/48 (!) 124/54  Pulse: (!) 58  63 64  Resp: (!) 22  17 18   Temp:   100.1 F (37.8 C) (!) 101.5 F (38.6 C)  TempSrc:   Oral Oral  SpO2: 90%  93% 92%  Weight:   81.1 kg   Height:   5\' 6"  (1.676 m)   PainSc:  0-No pain     Body mass index is 28.86 kg/m.   General: Well nourished and in no acute distress HENT: Normocephalic, atraumatic, moist mucus membranes Pulm: Tachypneic but with good air movement and no wheezing or crackles  CV: RRR, systolic murmurs present, no rubs. Abdomen: Active bowel sounds, abdomen is distended but nontender. Extremities: Pulses 4+ pulses in bilateral upper extremities, no LE edema  Skin: Warm and dry  Neuro: Patient is alert and oriented to her self only  POCUS: Unable to complete due to patient preference. Sub-optimal views but no apparent fluid noted in Morison's pouch.   A/P: Fever - Patient has a fever in the setting of hepatic encephalopathy.  1.  Review chest x-ray-shows no acute cardiopulmonary disease 2.  Reviewed CT chest- view of the abdomen but does not show any signs of ascites.  We will empirically treat her for SBP with ceftriaxone at this time. 3.  UA has been ordered. I spoke to the nurse to encourage urine sample from patient if able. 4.  Blood cultures have been drawn.  Marianna Payment, D.O. Date 05/23/2019 PM time 10:28 PM  PGY-1, [facility] Internal Medicine Residency

## 2019-05-23 NOTE — Progress Notes (Signed)
Notified  Provider coe, pt has temp 101.5, asymtomatic, continue monitor, f/u if more order.

## 2019-05-23 NOTE — Progress Notes (Signed)
Encourage pt void, states do not fell like need to void while sit on bed site comode, encourage drink more water, will monitor and bladder scan if pt still not void in 1-2 hr., rest in bed, continue monitor

## 2019-05-23 NOTE — ED Notes (Signed)
Family at bedside. 

## 2019-05-23 NOTE — Progress Notes (Signed)
ANTICOAGULATION CONSULT NOTE - Follow Up Consult  Pharmacy Consult for heparin Indication: chest pain/ACS  Labs: Recent Labs    05/22/19 1445 05/22/19 1546 05/23/19 0018  HGB 10.6*  --   --   HCT 34.6*  --   --   PLT 160  --   --   HEPARINUNFRC  --   --  0.35  CREATININE 1.15*  --   --   TROPONINIHS 69* 57*  --     Assessment/Plan:  73yo female therapeutic on heparin with initial dosing for CP. Will continue gtt at current rate and confirm stable with am labs.   Wynona Neat, PharmD, BCPS  05/23/2019,1:03 AM

## 2019-05-23 NOTE — Progress Notes (Signed)
ANTICOAGULATION CONSULT NOTE - Follow Up Consult   Pharmacy Consult for Heparin Indication: chest pain/ACS  Allergies  Allergen Reactions  . Acetaminophen     Other reaction(s): Other Patient avoids Tylenol due to fatty liver disease  . Cefazolin Other (See Comments)    Other reaction(s): Other caused C-DIF caused C-DIF c dif caused C-DIF   . Ciprofloxacin Hcl Nausea Only  . Ibuprofen     Other reaction(s): Other Diabetic--Concerns about kidneys  . Sulfa Antibiotics Hives  . Naproxen Sodium     Causes pain in veins  . Amoxicillin Diarrhea    Patient Measurements: Height: 5\' 6"  (167.6 cm) Weight: 177 lb (80.3 kg) IBW/kg (Calculated) : 59.3 Heparin Dosing Weight: 76 kg  Vital Signs: BP: 142/72 (09/18 0530) Pulse Rate: 63 (09/18 0530)  Labs: Recent Labs    05/22/19 1445 05/22/19 1546 05/23/19 0018 05/23/19 0331 05/23/19 0500  HGB 10.6*  --   --  10.5*  --   HCT 34.6*  --   --  33.6*  --   PLT 160  --   --  149*  --   APTT  --   --   --  75*  --   LABPROT  --   --   --  15.3*  --   INR  --   --   --  1.2  --   HEPARINUNFRC  --   --  0.35  --  0.28*  CREATININE 1.15*  --   --  1.20*  --   TROPONINIHS 69* 57*  --   --   --     Estimated Creatinine Clearance: 44 mL/min (A) (by C-G formula based on SCr of 1.2 mg/dL (H)).   Medical History: Past Medical History:  Diagnosis Date  . Diabetes mellitus without complication ([place])   . Hypertension   . NASH (nonalcoholic steatohepatitis)     Medications:  Scheduled:  . aspirin  81 mg Oral Pre-Cath  . rifaximin  550 mg Oral BID  . sodium chloride flush  3 mL Intravenous Q12H   Infusions:  . sodium chloride    . sodium chloride    . heparin 900 Units/hr (05/22/19 1718)  . nitroGLYCERIN 5 mcg/min (05/22/19 1717)    Assessment: 73 y.o. female presenting with chest pain. PMH includes DM, HTN, HLD, and encephalopathy. Of note, history of GI bleed however no reports of a current bleed. No anticoagulation PTA.   Pharmacy consulted for heparin dosing for ACS.   Heparin level 0.28 subtherapeutic on heparin 900 units/hr. Confirmed with nursing heparin infusion rate of 900 units/hr and no IV access issues. No reported bleeding. Hgb 10.5 and plt 149 stable.   Goal of Therapy:  Heparin level 0.3-0.7 units/ml Monitor platelets by anticoagulation protocol: Yes   Plan:  Increase heparin to 1050 units/hr Check 8hr heparin level at 1430 on 9/18 Monitor heparin level, CBC, and S/S of bleeding daily    Cristela Felt, PharmD PGY1 Pharmacy Resident [employer]: [phone]

## 2019-05-23 NOTE — Progress Notes (Signed)
Pt refused to get stuck after 1st round of blood work. Text paged MDs regarding this matter but no return calls.

## 2019-05-23 NOTE — Progress Notes (Signed)
   Subjective:  Patient confused on exam. Oriented to self. Difficulty finding words, but endorses she knows its September ,2020 when told. She would like to leave, but can not express her reason. Her treatment and further workup for chest pain is reviewed. Denies any CP at this time.   Objective:  Vital signs in last 24 hours: Vitals:   05/23/19 0500 05/23/19 0530 05/23/19 0615 05/23/19 0800  BP: 133/66 (!) 142/72 136/67 130/64  Pulse: 60 63 (!) 59 73  Resp: (!) 25 (!) 23 (!) 25 14  Temp:      TempSrc:      SpO2: 93% 94% 92% 91%  Weight:      Height:       Physical Exam Constitutional:      General: She is not in acute distress. Cardiovascular:     Rate and Rhythm: Normal rate and regular rhythm.     Comments: Systolic murmur  Abdominal:     General: Bowel sounds are normal.     Palpations: Abdomen is soft.  Skin:    General: Skin is warm and dry.      Assessment/Plan:  Active Problems:   Chest pain  Avian Ohmer is a 73 year old female with HTN, HLD,T2DM, IBS, and [name] cirrhosis diagnosed biopsy in 2006 and recent admission 9/8-9/10 for hepatic encephalopathy with elevated ammonia levels and AKI who presents with chest pain. Patient also acutely encephalopathic, this is a decline from discharge.   #Chest pain Patient denies any chest pain at the moment.  Cardiology following plan for heart cath today, but with hold off on cath due to patients mental status . Patient not complaining of chest pain. Dilated IVC noted by cards w/ D-dimer elevated. Holding Beta Blocker, heart rate ~60. - IV Nitro - IV heparin - Aspirin. - CTA Chest  #Acute encephalopathy Recent admission for acute encephalopathy secondary to [facility] cirrhosis.  Presents with similar symptoms, of confusion.  We will continue treatment with lactulose and rifaximin. - lactulose  - rifaximin   # CKD Stage 3  Baseline Cr 1.15, Cr 1.2 today  #HTN On amlodipine 10 mg, Telmisartan-HCTZ 80-25, and Metoprolol  tartrate 25mg  per chart. SBP 130s -147 on recent checks. Will continue to monitor - holding home meds   #HLD - Ezetimibe 10 mg   Dispo: Anticipated discharge with clinical improvement   Tamsen Snider, MD PGY1  [phone]

## 2019-05-23 NOTE — Progress Notes (Signed)
Dr Marianna Payment call back, no order for temp, monitor, make sure pt void and send urine out, will encourage pt to void and drink plenty water continue monitor.

## 2019-05-23 NOTE — Progress Notes (Addendum)
   Coverage update. When pt arrived to unit, she reported recurrent CP but was poor historian. EKG done without acute changes, actually looks better than prior. I spoke with nurse and she re-evaluated patient who reports no further chest pain (resolved without intervention). hsTroponin is pending. Continue plan as discussed to transition to oral Imdur. GI cocktail ordered. In the interim her d-dimer has returned at 4.01. This may be sequelae of liver disease but given clinical findings mentioned in rounding note, needs further evaluation. Have relayed to IM for further decision making of CTA vs VQ - they may proceed with CTA but plan to see if she would be able to cooperate with this first. Melina Copa PA-C

## 2019-05-23 NOTE — Progress Notes (Signed)
ANTICOAGULATION CONSULT NOTE - Follow Up Consult  Pharmacy Consult for heparin Indication: chest pain/ACS  Allergies  Allergen Reactions  . Acetaminophen     Other reaction(s): Other Patient avoids Tylenol due to fatty liver disease  . Cefazolin Other (See Comments)    Other reaction(s): Other caused C-DIF caused C-DIF c dif caused C-DIF   . Ciprofloxacin Hcl Nausea Only  . Ibuprofen     Other reaction(s): Other Diabetic--Concerns about kidneys  . Sulfa Antibiotics Hives  . Naproxen Sodium     Causes pain in veins  . Amoxicillin Diarrhea    Did it involve swelling of the face/tongue/throat, SOB, or low BP? No Did it involve sudden or severe rash/hives, skin peeling, or any reaction on the inside of your mouth or nose? No Did you need to seek medical attention at a hospital or doctor's office? No When did it last happen?several years If all above answers are "NO", may proceed with cephalosporin use.     Patient Measurements: Height: 5\' 6"  (167.6 cm) Weight: 178 lb 12.8 oz (81.1 kg) IBW/kg (Calculated) : 59.3 Heparin Dosing Weight:  76 kg  Vital Signs: Temp: 101.5 F (38.6 C) (09/18 2058) Temp Source: Oral (09/18 2058) BP: 124/54 (09/18 2058) Pulse Rate: 64 (09/18 2058)  Labs: Recent Labs    05/22/19 1445 05/22/19 1546  05/23/19 0331 05/23/19 0500 05/23/19 1039 05/23/19 1250 05/23/19 2048  HGB 10.6*  --   --  10.5*  --   --   --   --   HCT 34.6*  --   --  33.6*  --   --   --   --   PLT 160  --   --  149*  --   --   --   --   APTT  --   --   --  75*  --   --   --   --   LABPROT  --   --   --  15.3*  --   --   --   --   INR  --   --   --  1.2  --   --   --   --   HEPARINUNFRC  --   --    < >  --  0.28*  --  0.29* 0.29*  CREATININE 1.15*  --   --  1.20*  --   --   --   --   TROPONINIHS 69* 57*  --   --   --  29*  --   --    < > = values in this interval not displayed.    Estimated Creatinine Clearance: 44.2 mL/min (A) (by C-G formula based on SCr  of 1.2 mg/dL (H)).  Assessment: Anticoag: Heparin - ACS/Chest pain. History of GI bleed however no reports of a current bleed. No anticoagulation PTA. Trop 69 > 57.   Heparin level remains low, no issues so far on 2nd shift but appears drip was paused ~45 minutes a few hours ago. No S/Sx bleeding.  Goal of Therapy:  Heparin level 0.3-0.7 units/ml Monitor platelets by anticoagulation protocol: Yes   Plan:  -Increase heparin slightly to 1300 units/hr -Recheck with daily labs   Arrie Senate, PharmD, BCPS Clinical Pharmacist [phone] Please check AMION for all [facility] Pharmacy numbers 05/23/2019

## 2019-05-23 NOTE — Progress Notes (Signed)
  Echocardiogram 2D Echocardiogram limited has been performed.  Sheila [name] 05/23/2019, 3:26 PM

## 2019-05-24 ENCOUNTER — Inpatient Hospital Stay (HOSPITAL_COMMUNITY): Payer: Medicare Other

## 2019-05-24 DIAGNOSIS — R778 Other specified abnormalities of plasma proteins: Secondary | ICD-10-CM

## 2019-05-24 DIAGNOSIS — K7469 Other cirrhosis of liver: Secondary | ICD-10-CM

## 2019-05-24 DIAGNOSIS — I248 Other forms of acute ischemic heart disease: Secondary | ICD-10-CM

## 2019-05-24 DIAGNOSIS — I10 Essential (primary) hypertension: Secondary | ICD-10-CM

## 2019-05-24 DIAGNOSIS — K7581 Nonalcoholic steatohepatitis (NASH): Secondary | ICD-10-CM

## 2019-05-24 LAB — URINALYSIS, ROUTINE W REFLEX MICROSCOPIC
Bilirubin Urine: NEGATIVE
Glucose, UA: NEGATIVE mg/dL
Ketones, ur: NEGATIVE mg/dL
Nitrite: NEGATIVE
Protein, ur: NEGATIVE mg/dL
Specific Gravity, Urine: 1.039 — ABNORMAL HIGH (ref 1.005–1.030)
pH: 5 (ref 5.0–8.0)

## 2019-05-24 LAB — BASIC METABOLIC PANEL
Anion gap: 10 (ref 5–15)
BUN: 19 mg/dL (ref 8–23)
CO2: 22 mmol/L (ref 22–32)
Calcium: 8.9 mg/dL (ref 8.9–10.3)
Chloride: 103 mmol/L (ref 98–111)
Creatinine, Ser: 1.17 mg/dL — ABNORMAL HIGH (ref 0.44–1.00)
GFR calc Af Amer: 53 mL/min — ABNORMAL LOW (ref >60)
GFR calc non Af Amer: 46 mL/min — ABNORMAL LOW (ref >60)
Glucose, Bld: 119 mg/dL — ABNORMAL HIGH (ref 70–99)
Potassium: 3.2 mmol/L — ABNORMAL LOW (ref 3.5–5.1)
Sodium: 135 mmol/L (ref 135–145)

## 2019-05-24 LAB — LIPID PANEL
Cholesterol: 111 mg/dL (ref 0–200)
HDL: 37 mg/dL — ABNORMAL LOW (ref >40)
LDL Cholesterol: 66 mg/dL (ref 0–99)
Total CHOL/HDL Ratio: 3 RATIO
Triglycerides: 40 mg/dL (ref <150)
VLDL: 8 mg/dL (ref 0–40)

## 2019-05-24 LAB — CBC
HCT: 30.5 % — ABNORMAL LOW (ref 36.0–46.0)
Hemoglobin: 10 g/dL — ABNORMAL LOW (ref 12.0–15.0)
MCH: 30.2 pg (ref 26.0–34.0)
MCHC: 32.8 g/dL (ref 30.0–36.0)
MCV: 92.1 fL (ref 80.0–100.0)
Platelets: 126 10*3/uL — ABNORMAL LOW (ref 150–400)
RBC: 3.31 MIL/uL — ABNORMAL LOW (ref 3.87–5.11)
RDW: 19.1 % — ABNORMAL HIGH (ref 11.5–15.5)
WBC: 7.7 10*3/uL (ref 4.0–10.5)
nRBC: 0 % (ref 0.0–0.2)

## 2019-05-24 LAB — HEPARIN LEVEL (UNFRACTIONATED): Heparin Unfractionated: 0.37 IU/mL (ref 0.30–0.70)

## 2019-05-24 MED ORDER — IOHEXOL 300 MG/ML  SOLN
100.0000 mL | Freq: Once | INTRAMUSCULAR | Status: AC | PRN
  Administered 2019-05-24: 14:00:00 100 mL via INTRAVENOUS

## 2019-05-24 MED ORDER — LACTULOSE 10 GM/15ML PO SOLN
30.0000 g | Freq: Three times a day (TID) | ORAL | Status: DC
  Administered 2019-05-24 – 2019-05-25 (×2): 30 g via ORAL
  Filled 2019-05-24 (×2): qty 45

## 2019-05-24 MED ORDER — AMLODIPINE BESYLATE 10 MG PO TABS
10.0000 mg | ORAL_TABLET | Freq: Every day | ORAL | Status: DC
  Administered 2019-05-24 – 2019-05-26 (×3): 10 mg via ORAL
  Filled 2019-05-24 (×3): qty 1

## 2019-05-24 MED ORDER — LOSARTAN POTASSIUM 25 MG PO TABS
25.0000 mg | ORAL_TABLET | Freq: Every day | ORAL | Status: DC
  Administered 2019-05-24 – 2019-05-26 (×3): 25 mg via ORAL
  Filled 2019-05-24 (×4): qty 1

## 2019-05-24 MED ORDER — METOPROLOL TARTRATE 25 MG PO TABS
25.0000 mg | ORAL_TABLET | Freq: Two times a day (BID) | ORAL | Status: DC
  Administered 2019-05-24 – 2019-05-26 (×5): 25 mg via ORAL
  Filled 2019-05-24 (×5): qty 1

## 2019-05-24 NOTE — Plan of Care (Signed)

## 2019-05-24 NOTE — Progress Notes (Signed)
Progress Note  Patient Name: Sheila [name] Date of Encounter: 05/24/2019  Primary Cardiologist: Fransico Him, MD   Subjective   She denies any further chest pain.  Inpatient Medications    Scheduled Meds:  buPROPion  300 mg Oral Daily   ezetimibe  10 mg Oral q morning - 10a   FLUoxetine  40 mg Oral Daily   influenza vaccine adjuvanted  0.5 mL Intramuscular Tomorrow-1000   isosorbide mononitrate  30 mg Oral Daily   lactulose  20 g Oral TID   pantoprazole  40 mg Oral Daily   rifaximin  550 mg Oral BID   Continuous Infusions:  heparin 1,300 Units/hr (05/24/19 0423)   PRN Meds: acetaminophen, clobetasol cream, morphine injection, nitroGLYCERIN, ondansetron, senna-docusate   Vital Signs    Vitals:   05/23/19 2058 05/23/19 2330 05/24/19 0518 05/24/19 0756  BP: (!) 124/54  (!) 150/55 (!) 152/59  Pulse: 64  60 72  Resp: 18  17 16   Temp: (!) 101.5 F (38.6 C) 99.7 F (37.6 C) 99.3 F (37.4 C) 98.7 F (37.1 C)  TempSrc: Oral  Oral Oral  SpO2: 92%  91% 96%  Weight:   82.2 kg   Height:        Intake/Output Summary (Last 24 hours) at 05/24/2019 0833 Last data filed at 05/24/2019 0423 Gross per 24 hour  Intake 879.13 ml  Output 750 ml  Net 129.13 ml   Last 3 Weights 05/24/2019 05/23/2019 05/22/2019  Weight (lbs) 181 lb 3.2 oz 178 lb 12.8 oz 177 lb  Weight (kg) 82.192 kg 81.103 kg 80.287 kg      Telemetry    SR - Personally Reviewed  ECG    No new tracing - Personally Reviewed  Physical Exam   GEN: No acute distress.   Neck: No JVD Cardiac: RRR, 2/6 systolic murmurs, rubs, or gallops.  Respiratory: Clear to auscultation bilaterally. GI: Soft, nontender, non-distended  MS: No edema; No deformity. Neuro:  Nonfocal  Psych: Normal affect   Labs    High Sensitivity Troponin:   Recent Labs  Lab 05/22/19 1445 05/22/19 1546 05/23/19 1039  TROPONINIHS 69* 57* 29*      Chemistry Recent Labs  Lab 05/22/19 1445 05/23/19 0331 05/24/19 0538  NA  139 138 135  K 3.7 3.7 3.2*  CL 107 107 103  CO2 24 21* 22  GLUCOSE 172* 129* 119*  BUN 15 17 19   CREATININE 1.15* 1.20* 1.17*  CALCIUM 9.5 9.2 8.9  PROT 7.4 7.2  --   ALBUMIN 3.3* 3.2*  --   AST 45* 35  --   ALT 25 24  --   ALKPHOS 104 86  --   BILITOT 1.2 1.4*  --   GFRNONAA 47* 44* 46*  GFRAA 54* 52* 53*  ANIONGAP 8 10 10      Hematology Recent Labs  Lab 05/22/19 1445 05/23/19 0331 05/23/19 1039 05/24/19 0538  WBC 6.3 7.1  --  7.7  RBC 3.62* 3.55* 3.64* 3.31*  HGB 10.6* 10.5*  --  10.0*  HCT 34.6* 33.6*  --  30.5*  MCV 95.6 94.6  --  92.1  MCH 29.3 29.6  --  30.2  MCHC 30.6 31.3  --  32.8  RDW 18.6* 18.8*  --  19.1*  PLT 160 149*  --  126*    BNPNo results for input(s): BNP, PROBNP in the last 168 hours.   DDimer  Recent Labs  Lab 05/23/19 1039  DDIMER 4.01*  Radiology    Ct Angio Chest Pe W Or Wo Contrast  Result Date: 05/23/2019 CLINICAL DATA:  SOB EXAM: CT ANGIOGRAPHY CHEST WITH CONTRAST TECHNIQUE: Multidetector CT imaging of the chest was performed using the standard protocol during bolus administration of intravenous contrast. Multiplanar CT image reconstructions and MIPs were obtained to evaluate the vascular anatomy. CONTRAST:  61m OMNIPAQUE IOHEXOL 350 MG/ML SOLN COMPARISON:  None. FINDINGS: Cardiovascular: Satisfactory opacification of the pulmonary arteries to the segmental level. No evidence of pulmonary embolism. Enlarged heart size. Trace pericardial effusion. Mediastinum/Nodes: No enlarged mediastinal, hilar, or axillary lymph nodes. Thyroid gland, trachea, and esophagus demonstrate no significant findings. Lungs/Pleura: Lungs are clear. Trace bilateral pleural effusion. No pneumothorax. Upper Abdomen: No acute abnormality. Musculoskeletal: No chest wall abnormality. No acute or significant osseous findings. Review of the MIP images confirms the above findings. IMPRESSION: 1. No evidence of a pulmonary embolus. 2. Trace bilateral pleural effusion.  Electronically Signed   By: HKathreen Devoid  On: 05/23/2019 18:11   Dg Chest Port 1 View  Result Date: 05/22/2019 CLINICAL DATA:  Mid chest pain right jaw and 1 day. Dizziness and nausea. Some air. EXAM: PORTABLE CHEST 1 VIEW COMPARISON:  05/13/2019 FINDINGS: Cardiac silhouette is normal in size. No mediastinal or hilar masses and no evidence of adenopathy. Is Mild prominence of the bronchovascular markings bilaterally stable from prior exam. Lungs otherwise clear. No convincing pleural effusion or pneumothorax. Skeletal structures are grossly intact. IMPRESSION: No acute cardiopulmonary disease. Electronically Signed   By: DLajean ManesM.D.   On: 05/22/2019 15:13    Cardiac Studies   TTE:  05/23/2019  1. Left ventricular ejection fraction, by visual estimation, is 55 to 60%. The left ventricle has normal function. Normal left ventricular size. There is no left ventricular hypertrophy.  2. Left ventricular diastolic Doppler parameters are consistent with impaired relaxation pattern of LV diastolic filling.  3. Global right ventricle has normal systolic function.The right ventricular size is normal. No increase in right ventricular wall thickness.  4. Left atrial size was normal.  5. Right atrial size was normal.  6. The mitral valve is normal in structure. Trace mitral valve regurgitation. No evidence of mitral stenosis.  7. The tricuspid valve is normal in structure. Tricuspid valve regurgitation was not visualized by color flow Doppler.  8. The aortic valve is normal in structure. Aortic valve regurgitation was not visualized by color flow Doppler. Structurally normal aortic valve, with no evidence of sclerosis or stenosis.  9. The pulmonic valve was normal in structure. Pulmonic valve regurgitation is not visualized by color flow Doppler. 10. The inferior vena cava is normal in size with greater than 50% respiratory variability, suggesting right atrial pressure of 3 mmHg.   Patient Profile      73y.o. [place]   [place]   1. Chest pain - minimal troponin elevation, now downtrending, 69->29, no more chest pain, negative chest CTA for pulmonary embolism, the patient doesn't want any invasive procedure, she has normal LVEF and no regional wall motion abnormalities. We will sign off. This is most probably demand ischemia in the setting of infection (fever, elevated CRP).  2. Hypertension - elevated, I will add losartan 25 mg po daily  3. NASH - per primary team   [facility] will sign off.   Medication Recommendations:  As above Other recommendations (labs, testing, etc):  No further testing Follow up as an outpatient:  As needed  For questions or updates, please  contact [place] Please consult [email or web address] for contact info under        Signed, Ena Dawley, MD  05/24/2019, 8:33 AM

## 2019-05-24 NOTE — Progress Notes (Signed)
ANTICOAGULATION CONSULT NOTE - Follow Up Consult  Pharmacy Consult for heparin Indication: chest pain/ACS  Allergies  Allergen Reactions  . Acetaminophen     Other reaction(s): Other Patient avoids Tylenol due to fatty liver disease  . Cefazolin Other (See Comments)    Other reaction(s): Other caused C-DIF caused C-DIF c dif caused C-DIF   . Ciprofloxacin Hcl Nausea Only  . Ibuprofen     Other reaction(s): Other Diabetic--Concerns about kidneys  . Sulfa Antibiotics Hives  . Naproxen Sodium     Causes pain in veins  . Amoxicillin Diarrhea    Did it involve swelling of the face/tongue/throat, SOB, or low BP? No Did it involve sudden or severe rash/hives, skin peeling, or any reaction on the inside of your mouth or nose? No Did you need to seek medical attention at a hospital or doctor's office? No When did it last happen?several years If all above answers are "NO", may proceed with cephalosporin use.     Patient Measurements: Height: 5\' 6"  (167.6 cm) Weight: 181 lb 3.2 oz (82.2 kg)(scale a ) IBW/kg (Calculated) : 59.3 Heparin Dosing Weight:  76 kg  Vital Signs: Temp: 98.7 F (37.1 C) (09/19 0756) Temp Source: Oral (09/19 0756) BP: 152/59 (09/19 0756) Pulse Rate: 72 (09/19 0756)  Labs: Recent Labs    05/22/19 1445 05/22/19 1546  05/23/19 0331  05/23/19 1039 05/23/19 1250 05/23/19 2048 05/24/19 0538  HGB 10.6*  --   --  10.5*  --   --   --   --  10.0*  HCT 34.6*  --   --  33.6*  --   --   --   --  30.5*  PLT 160  --   --  149*  --   --   --   --  126*  APTT  --   --   --  75*  --   --   --   --   --   LABPROT  --   --   --  15.3*  --   --   --   --   --   INR  --   --   --  1.2  --   --   --   --   --   HEPARINUNFRC  --   --    < >  --    < >  --  0.29* 0.29* 0.37  CREATININE 1.15*  --   --  1.20*  --   --   --   --  1.17*  TROPONINIHS 69* 57*  --   --   --  29*  --   --   --    < > = values in this interval not displayed.    Estimated Creatinine  Clearance: 45.6 mL/min (A) (by C-G formula based on SCr of 1.17 mg/dL (H)).  Assessment: Anticoag: Heparin - ACS/Chest pain. History of GI bleed however no reports of a current bleed. No anticoagulation PTA. Trop 69 > 57.   Heparin level therapeutic at 0.37 after increasing drip rate to 1300 units/hr. Hgb stable, platelets low at 126 and trending down. No overt signs of bleeding. Will continue current drip rate of heparin and check confirmatory level tonight.  Goal of Therapy:  Heparin level 0.3-0.7 units/ml Monitor platelets by anticoagulation protocol: Yes   Plan:  - Continue heparin IV at 1300 units/hr - Check confirmatory 8-hr heparin level - Daily HL and CBC - Monitor for  signs of bleeding  Richardine Service, PharmD PGY1 Pharmacy Resident Phone: [phone] 05/24/2019  8:26 AM  Please check [email or web address] for unit-specific pharmacy phone numbers.

## 2019-05-24 NOTE — Progress Notes (Signed)
Subjective:  Patient was seen at bedside. She states she is doing well. She states she had "One big" bowel movement today and yesterday. She knows that she is hospitalized and is alert to person, but is not oriented to place, time. She can spell world, but cannot spell it backwards. She denies chest pain, dysuria, pain,  nausea, and vomiting. She endorses a fever last night. All questions and concerns were addressed.   Objective:  Vital signs in last 24 hours: Vitals:   05/23/19 0955 05/23/19 2058 05/23/19 2330 05/24/19 0518  BP: (!) 134/48 (!) 124/54  (!) 150/55  Pulse: 63 64  60  Resp: 17 18  17   Temp: 100.1 F (37.8 C) (!) 101.5 F (38.6 C) 99.7 F (37.6 C) 99.3 F (37.4 C)  TempSrc: Oral Oral  Oral  SpO2: 93% 92%  91%  Weight: 81.1 kg   82.2 kg  Height: 5\' 6"  (1.676 m)       General: Seen at bedside, struggles to find words Cardiac: RRR, systolic murmur Pulmonary: CTABL, no m/r/g Abdomen: pain on palpation diffusely, soft, no rebound, normoactive bowel sounds Extremity: No LE edema Neuro: Awake, oriented to person, not to place or time, moves all extremities  Assessment/Plan:  Principal Problem:   Chest pain Active Problems:   Hepatic encephalopathy (HCC)   Liver cirrhosis (HCC)   NASH (nonalcoholic steatohepatitis)   NSTEMI (non-ST elevated myocardial infarction) [facility])  This is a 73 year old female with a history HTN, HLD, T2DM, IBS, and NASH cirrhosis recently admitted 9/8-9/10 for hepatic encephalopathy and AKI who presented with chest pain concerning for CAD.   Chest pain: -Patient denies any chest pain.  Patient currently having acute encephalopathy so held off on heart catheterization. Obtained CTA that was negative for PE. Trop elevated to 69, down trended to 29.  TTE showed normal EF, no wall motion abnormalities.  Patient declining any invasive procedures.  Cardiology signed off, felt this was likely due to demand ischemia in setting of infection.   -Discontinue IV nitro and IV heparin -Continue ASA -Monitor for now  Acute encephalopathy: Patient has history of hepatic encephalopathy secondary to NASH cirrhosis.  She is being treated with lactulose and had 1 bowel movement today and 1 yesterday.  Patient continues to have confusion about where she is and what year it is.  Overnight she was noted to have a fever up to 101.5.  She was having some abdominal pain today. Abdomen was mildly distended with some tenderness to palpation, no acute abdomen.  Pocus was attempted last night however patient was declining this.  CT chest showed no evidence of ascites. Started on ceftriaxone for empiric treatment of SBP.  Urinalysis showed moderate leukocytes, no nitrites, WBC 21-50, few bacteria.  Given fever and concerns for SBP will obtain CT abdomen to further evaluate.  -CT abdomen/pelvis -Continue ceftriaxone -Follow-up blood cultures and urine cultures -Continue lactulose, goal of 3 bowel movements per day -Continue rifaximin 550 mg BID  CKD stage III: -BL around 1.15. Today 1.17. Stable   HTN: On amlodipine 10 mg, Telmisartan-HCTZ 80-25, and Metoprolol tartrate 25mg  per chart. BP around 124-152/48-59. Cards added losaratan 25 mg daily.   -Restart amlodipine 10 and metoprolol tartrate 25 mg daily -Continue losartan 25 mg daily -Hold telmisartan-HCTZ for now  FEN: No fluids, replete lytes prn, HH diet  VTE ppx: Lovenox  Code Status: FULL    Dispo: Anticipated discharge is pending clinical improvement.   Asencion Noble, MD 05/24/2019,  6:08 AM Pager: [phone]

## 2019-05-25 ENCOUNTER — Encounter (HOSPITAL_COMMUNITY): Payer: Self-pay

## 2019-05-25 DIAGNOSIS — K589 Irritable bowel syndrome without diarrhea: Secondary | ICD-10-CM

## 2019-05-25 LAB — BASIC METABOLIC PANEL
Anion gap: 11 (ref 5–15)
BUN: 15 mg/dL (ref 8–23)
CO2: 20 mmol/L — ABNORMAL LOW (ref 22–32)
Calcium: 8.4 mg/dL — ABNORMAL LOW (ref 8.9–10.3)
Chloride: 100 mmol/L (ref 98–111)
Creatinine, Ser: 0.96 mg/dL (ref 0.44–1.00)
GFR calc Af Amer: >60 mL/min (ref >60)
GFR calc non Af Amer: 58 mL/min — ABNORMAL LOW (ref >60)
Glucose, Bld: 108 mg/dL — ABNORMAL HIGH (ref 70–99)
Potassium: 3.4 mmol/L — ABNORMAL LOW (ref 3.5–5.1)
Sodium: 131 mmol/L — ABNORMAL LOW (ref 135–145)

## 2019-05-25 LAB — CBC
HCT: 31.3 % — ABNORMAL LOW (ref 36.0–46.0)
Hemoglobin: 10 g/dL — ABNORMAL LOW (ref 12.0–15.0)
MCH: 29.8 pg (ref 26.0–34.0)
MCHC: 31.9 g/dL (ref 30.0–36.0)
MCV: 93.2 fL (ref 80.0–100.0)
Platelets: 142 10*3/uL — ABNORMAL LOW (ref 150–400)
RBC: 3.36 MIL/uL — ABNORMAL LOW (ref 3.87–5.11)
RDW: 18.9 % — ABNORMAL HIGH (ref 11.5–15.5)
WBC: 8.6 10*3/uL (ref 4.0–10.5)
nRBC: 0 % (ref 0.0–0.2)

## 2019-05-25 LAB — HEPARIN LEVEL (UNFRACTIONATED): Heparin Unfractionated: <0.1 IU/mL — ABNORMAL LOW (ref 0.30–0.70)

## 2019-05-25 MED ORDER — LACTULOSE 10 GM/15ML PO SOLN
40.0000 g | Freq: Three times a day (TID) | ORAL | Status: DC
  Administered 2019-05-25 – 2019-05-26 (×3): 40 g via ORAL
  Filled 2019-05-25 (×3): qty 60

## 2019-05-25 MED ORDER — SODIUM CHLORIDE 0.9 % IV SOLN: 2.0000 g | INTRAVENOUS | Status: DC

## 2019-05-25 MED ORDER — ENOXAPARIN SODIUM 40 MG/0.4ML ~~LOC~~ SOLN
40.0000 mg | SUBCUTANEOUS | Status: DC
  Administered 2019-05-25: 40 mg via SUBCUTANEOUS
  Filled 2019-05-25: qty 0.4

## 2019-05-25 MED ORDER — GABAPENTIN 100 MG PO CAPS
200.0000 mg | ORAL_CAPSULE | Freq: Every day | ORAL | Status: DC
  Administered 2019-05-25 – 2019-05-26 (×2): 200 mg via ORAL
  Filled 2019-05-25 (×2): qty 2

## 2019-05-25 NOTE — Plan of Care (Signed)

## 2019-05-25 NOTE — Progress Notes (Signed)
Subjective: Patient reports that she is doing well today, she is more alert today.  She reports that she has not had a bowel movement yesterday or today.  Denies any abdominal pain, chest pain, shortness of attempts.  She was requesting to go home today.  We discussed that we would prefer to monitor her overnight due to her confusion yesterday.  Patient was still requesting to go home.  Will reevaluate later this afternoon.  Went to evaluate this afternoon. Patient reports that she has had a bowel movement today and states that she will likely be having one later today.  We discussed that we would prefer to monitor of her overnight to make sure that her confusion has resolved.  She was requesting for [place] to contact her son to give him an update.  Agreeable to staying today.  Contacted son and gave update on patient's status.  Objective:  Vital signs in last 24 hours: Vitals:   05/24/19 1557 05/24/19 2015 05/24/19 2209 05/25/19 0443  BP: (!) 146/56 (!) 134/54 (!) 135/56 136/62  Pulse: 66 62 61 67  Resp:  16  16  Temp:  99.8 F (37.7 C)  100.2 F (37.9 C)  TempSrc:  Oral  Oral  SpO2:  92%  90%  Weight:    82.8 kg  Height:        General: Middle-aged female, no acute distress, lying in bed Cardiac: RRR, systolic murmur Pulmonary: CTA BL, no murmurs rubs or gallops Abdomen: Soft, non-tender, non-distended, normoactive bowel sounds Extremity: No LE edema Neuro: No asterixis     Assessment/Plan:  Principal Problem:   Chest pain Active Problems:   Hepatic encephalopathy (HCC)   Liver cirrhosis (HCC)   NASH (nonalcoholic steatohepatitis)   NSTEMI (non-ST elevated myocardial infarction) (HCC)   Elevated troponin   Demand ischemia [facility])   Essential hypertension  This is a 73 year old female with a history HTN, HLD, T2DM, IBS, and NASH cirrhosis recently admitted 9/8-9/10 for hepatic encephalopathy and AKI who presented with chest pain concerning for CAD.   Acute  encephalopathy: Patient has history of hepatic encephalopathy secondary to NASH cirrhosis.  She has not had a bowel today denies having any bowel movements yesterday.  Her confusion has improved today, is alert and oriented x3 has no asterixis on exam today.  She fever of 101.5 2 nights ago and abdominal pain. Patient was supposed to be started on ceftriaxone however this was not started.  CT abdomen pelvis showed cirrhosis with trace perihepatic ascites, no evidence of obstruction.  Does not appear to be enough fluid to drain.  Since patient has some improvement with out the empiric ceftriaxone will hold off on starting this at this time.  Patient is adamant about leaving however given her history of acute encephalopathy would prefer to monitor overnight.   -Follow-up blood cultures and urine cultures -Continue lactulose, goal of 3 bowel movements per day -Continue rifaximin 550 mg BID  Chest pain: Patient denies any chest pain now. Obtained CTA that was negative for PE. Trop elevated to 69, down trended to 29.  TTE showed normal EF, no wall motion abnormalities.  Patient declining any invasive procedures.  Cardiology signed off, felt this was likely due to demand ischemia in setting of infection.  -Continue ASA -Monitor   HTN: On amlodipine 10 mg, Telmisartan-HCTZ 80-25, and Metoprolol tartrate 55m per chart. BP around 135-152/52-59. Cards added losaratan 25 mg daily.   -Restart amlodipine 10 and metoprolol tartrate 25 mg  daily -Continue losartan 25 mg daily -Hold telmisartan-HCTZ for now  FEN: No fluids, replete lytes prn, HH diet  VTE ppx: Lovenox  Code Status: FULL    Dispo: Anticipated discharge in approximately 1 day.   Asencion Noble, MD 05/25/2019, 6:04 AM Pager: [phone]

## 2019-05-26 DIAGNOSIS — N179 Acute kidney failure, unspecified: Secondary | ICD-10-CM

## 2019-05-26 DIAGNOSIS — Z881 Allergy status to other antibiotic agents status: Secondary | ICD-10-CM

## 2019-05-26 DIAGNOSIS — Z886 Allergy status to analgesic agent status: Secondary | ICD-10-CM

## 2019-05-26 DIAGNOSIS — R509 Fever, unspecified: Secondary | ICD-10-CM

## 2019-05-26 DIAGNOSIS — Z888 Allergy status to other drugs, medicaments and biological substances status: Secondary | ICD-10-CM

## 2019-05-26 DIAGNOSIS — E119 Type 2 diabetes mellitus without complications: Secondary | ICD-10-CM

## 2019-05-26 DIAGNOSIS — K580 Irritable bowel syndrome with diarrhea: Secondary | ICD-10-CM

## 2019-05-26 LAB — CBC
HCT: 30.2 % — ABNORMAL LOW (ref 36.0–46.0)
Hemoglobin: 10.3 g/dL — ABNORMAL LOW (ref 12.0–15.0)
MCH: 31 pg (ref 26.0–34.0)
MCHC: 34.1 g/dL (ref 30.0–36.0)
MCV: 91 fL (ref 80.0–100.0)
Platelets: 158 10*3/uL (ref 150–400)
RBC: 3.32 MIL/uL — ABNORMAL LOW (ref 3.87–5.11)
RDW: 18.5 % — ABNORMAL HIGH (ref 11.5–15.5)
WBC: 7.1 10*3/uL (ref 4.0–10.5)
nRBC: 0 % (ref 0.0–0.2)

## 2019-05-26 MED ORDER — LACTULOSE 10 GM/15ML PO SOLN: 40.0000 g | Freq: Three times a day (TID) | ORAL | 0 refills | Status: DC

## 2019-05-26 NOTE — Evaluation (Signed)
Physical Therapy Evaluation Patient Details Name: Sheila [name] MRN: [id number] DOB: [birth year] Today's Date: 05/26/2019   History of Present Illness  73yo female with recent admit 9/9-9/10/20 due to hepatic encephalopathy, now returning with intense chest pain and elevated troponin. PE negative. Per cardio, likely with demand ischemia in setting of infection. PMH DM, HTN, NASH  Clinical Impression   Patient received in bed, very eager to participate with PT today. Able to complete bed mobility with mod(I), S for functional transfers, and gait approximately 189f with min guard and HHA for balance. She is mildly unsteady, generally able to self-correct, but reports that this is a bit off from her baseline level of function. Able to toilet and perform peri-care with distant S. She was left up in the chair with all needs met, questions/concerns addressed. She will continue to benefit from skilled PT services in the acute setting, otherwise recommend skilled OP PT services moving forward.     Follow Up Recommendations Outpatient PT    Equipment Recommendations  Cane    Recommendations for Other Services       Precautions / Restrictions Precautions Precautions: Fall Restrictions Weight Bearing Restrictions: No      Mobility  Bed Mobility Overal bed mobility: Modified Independent             General bed mobility comments: mod(I), extended time  Transfers Overall transfer level: Needs assistance Equipment used: None Transfers: Sit to/from Stand Sit to Stand: Supervision         General transfer comment: S for safety, often furniture reaching  Ambulation/Gait Ambulation/Gait assistance: Min guard Gait Distance (Feet): 100 Feet Assistive device: None;1 person hand held assist Gait Pattern/deviations: Step-through pattern;Decreased stride length;Drifts right/left Gait velocity: decreased   General Gait Details: HHA for stability in hallway, easily fatigued  Stairs             Wheelchair Mobility    Modified Rankin (Stroke Patients Only)       Balance Overall balance assessment: Needs assistance Sitting-balance support: Feet supported Sitting balance-Leahy Scale: Normal     Standing balance support: No upper extremity supported Standing balance-Leahy Scale: Fair Standing balance comment: often furniture walks                             Pertinent Vitals/Pain Pain Assessment: No/denies pain    Home Living Family/patient expects to be discharged to:: Private residence Living Arrangements: Spouse/significant other Available Help at Discharge: Family;Available PRN/intermittently Type of Home: Independent living facility Home Access: Level entry     Home Layout: One level Home Equipment: None      Prior Function Level of Independence: Independent         Comments: Assisting spouse as needed as he has dementia     Hand Dominance   Dominant Hand: Right    Extremity/Trunk Assessment   Upper Extremity Assessment Upper Extremity Assessment: Generalized weakness    Lower Extremity Assessment Lower Extremity Assessment: Generalized weakness    Cervical / Trunk Assessment Cervical / Trunk Assessment: Normal  Communication   Communication: Expressive difficulties  Cognition Arousal/Alertness: Awake/alert Behavior During Therapy: WFL for tasks assessed/performed Overall Cognitive Status: Within Functional Limits for tasks assessed                                        General Comments  Exercises     Assessment/Plan    PT Assessment Patient needs continued PT services  PT Problem List Decreased strength;Decreased balance;Decreased activity tolerance;Decreased coordination;Decreased safety awareness;Decreased knowledge of use of DME;Cardiopulmonary status limiting activity       PT Treatment Interventions DME instruction;Functional mobility training;Balance  training;Patient/family education;Gait training;Therapeutic activities;Stair training;Therapeutic exercise;Neuromuscular re-education    PT Goals (Current goals can be found in the Care Plan section)  Acute Rehab PT Goals Patient Stated Goal: go home PT Goal Formulation: With patient Time For Goal Achievement: 05/28/19 Potential to Achieve Goals: Good    Frequency Min 3X/week   Barriers to discharge        Co-evaluation               AM-PAC PT "6 Clicks" Mobility  Outcome Measure Help needed turning from your back to your side while in a flat bed without using bedrails?: None Help needed moving from lying on your back to sitting on the side of a flat bed without using bedrails?: None Help needed moving to and from a bed to a chair (including a wheelchair)?: A Little Help needed standing up from a chair using your arms (e.g., wheelchair or bedside chair)?: A Little Help needed to walk in hospital room?: A Little Help needed climbing 3-5 steps with a railing? : A Little 6 Click Score: 20    End of Session   Activity Tolerance: Patient tolerated treatment well Patient left: in chair;with call bell/phone within reach   PT Visit Diagnosis: Unsteadiness on feet (R26.81);Muscle weakness (generalized) (M62.81);Difficulty in walking, not elsewhere classified (R26.2)    Time: 2229-7989 PT Time Calculation (min) (ACUTE ONLY): 29 min   Charges:   PT Evaluation $PT Eval Moderate Complexity: 1 Mod PT Treatments $Gait Training: 8-22 mins        Deniece Ree PT, DPT, CBIS  Supplemental Physical Therapist [place]    Pager [phone] Acute Rehab Office [phone]

## 2019-05-26 NOTE — Discharge Instructions (Signed)
Sheila [name],   It has been a pleasure working with you and we are glad you're feeling better. You were hospitalized for confusion due to your liver disease. You were treated with lactulose and rifaximin.    Please continue taking rifaximin as scheduled Please start taking lactulose daily, the goal is to have at least 3 bowel movements per day to prevent buildup of the ammonia  Follow up with your primary care provider in 1 weeks  If your symptoms worsen or you develop new symptoms, please seek medical help whether it is your primary care provider or emergency department.  Here are some Gastroenterologists to contact to discuss further therapy for your liver disease.   Dr. Mann/Dr. [facility] [address], [place], [place] [address] [phone]  [facility] [place], [place], [place] [address] [phone]  [facility] [address] [place], [place], [place] [address] [phone]

## 2019-05-26 NOTE — Progress Notes (Signed)
RN have pt discharge instructions and she stated understanding. Prescriptions escribed to home pharmacy. IV has been removed

## 2019-05-26 NOTE — Care Management Important Message (Signed)
Important Message  Patient Details  Name: Sheila [name] MRN: [id number] Date of Birth: [birth year]   Medicare Important Message Given:  Yes     [name] 05/26/2019, 12:11 PM

## 2019-05-26 NOTE — Discharge Summary (Addendum)
Name: Sheila [name] MRN: [id number] DOB: 1946/06/07 73 y.o. PCP: Chesley Noon, MD  Date of Admission: 05/22/2019 12:29 PM Date of Discharge: 05/26/19  Attending Physician: Oda Kilts, MD  Discharge Diagnosis: 1. Hepatic Encephalopathy 2. Demand Ischemia   Discharge Medications: Allergies as of 05/26/2019       Reactions   Acetaminophen    Other reaction(s): Other Patient avoids Tylenol due to fatty liver disease   Cefazolin Other (See Comments)   Other reaction(s): Other caused C-DIF caused C-DIF c dif caused C-DIF   Ciprofloxacin Hcl Nausea Only   Ibuprofen    Other reaction(s): Other Diabetic--Concerns about kidneys   Sulfa Antibiotics Hives   Naproxen Sodium    Causes pain in veins   Amoxicillin Diarrhea   Did it involve swelling of the face/tongue/throat, SOB, or low BP? No Did it involve sudden or severe rash/hives, skin peeling, or any reaction on the inside of your mouth or nose? No Did you need to seek medical attention at a hospital or doctor's office? No When did it last happen?   several years    If all above answers are "NO", may proceed with cephalosporin use.        Medication List     TAKE these medications    amLODipine 10 MG tablet Commonly known as: NORVASC Take 10 mg by mouth daily.   ARIPiprazole 10 MG tablet Commonly known as: ABILIFY Take 10 mg by mouth at bedtime.   buPROPion 300 MG 24 hr tablet Commonly known as: WELLBUTRIN XL Take 300 mg by mouth daily.   Calcium 500-125 MG-UNIT Tabs Take 2 tablets by mouth daily.   clobetasol cream 0.05 % Commonly known as: TEMOVATE Apply 1 application topically daily as needed for rash.   diclofenac sodium 1 % Gel Commonly known as: VOLTAREN Apply 2 g topically 2 (two) times daily as needed for pain.   diphenoxylate-atropine 2.5-0.025 MG tablet Commonly known as: LOMOTIL Take 2 tablets by mouth 4 (four) times daily.   ezetimibe 10 MG tablet Commonly known as: ZETIA  Take 10 mg by mouth every morning.   ferrous sulfate 325 (65 FE) MG EC tablet Take 325 mg by mouth 2 (two) times daily.   FLUoxetine 40 MG capsule Commonly known as: PROZAC Take 40 mg by mouth daily.   gabapentin 100 MG capsule Commonly known as: NEURONTIN Take 200 mg by mouth daily.   Janumet XR [phone] MG Tb24 Generic drug: SitaGLIPtin-MetFORMIN HCl Take 1 tablet by mouth every evening.   lactulose 10 GM/15ML solution Commonly known as: CHRONULAC Take 60 mLs (40 g total) by mouth 3 (three) times daily.   metoprolol tartrate 25 MG tablet Commonly known as: LOPRESSOR Take 25 mg by mouth 2 (two) times daily.   omeprazole 40 MG capsule Commonly known as: PRILOSEC Take 40 mg by mouth daily.   Prevagen Extra Strength 20 MG Caps Generic drug: Apoaequorin Take 20 mg by mouth daily.   rifaximin 550 MG Tabs tablet Commonly known as: Xifaxan Take 1 tablet (550 mg total) by mouth 2 (two) times daily.   Systane 0.4-0.3 % Soln Generic drug: Polyethyl Glycol-Propyl Glycol Place 1 drop into both eyes 2 (two) times daily.   telmisartan-hydrochlorothiazide 80-25 MG tablet Commonly known as: MICARDIS HCT Take 1 tablet by mouth daily.        Disposition and follow-up:   Sheila [name] was discharged from [facility] in Stable condition.  At the hospital follow up visit please  address:  1.   # Hepatic Encephalopathy       - Patient recently discharged after admission 9/8-9/10. Comes in for chest pain and found to be confused.       - Improved with Lactulose and Rifaximin       - Continue Lactulose and Rifaximin       - Follow up appointment in GI clinic   # Demand Ischemia   - Trop 69 downtrend to 29. EF 55-60%. Evaluated by cardiology and thought to be demand ischemia. Patient declined further workup with heart catheterization.  - Follow up for any symptoms of CP  # Fever -Patient with fever on 9/19 , blood cultures NGTD at 3 days -Follow up Blood  Cx  2.  Labs / imaging needed at time of follow-up:   3.  Pending labs/ test needing follow-up: Blood cultures NGTD at 3 days  Follow-up Appointments: Follow-up Information     Chesley Noon, MD. Go on 06/02/2019.   Specialty: Family Medicine Why: @11 :30Am Contact information: [place] [address] [phone]         [facility] Follow up.   Specialty: Rehabilitation Why: they will contact you regarding your apt,  Contact information: [address] [id number] [place] [facility] Course by problem list: 1.   #Acute Encephalopathy Patient came in with confusion and asterixis on exam . CT abdomen & pelvis on 9/19 showed cirrhosis with trace perihepatic ascites, no eveidence of obstruction. Confusion gradually improving with lactulose and Rifaximin. - Follow up with GI as outpatient - Continue Lactulose and Rifaximin, patient very resistant to taking, will take ongoing follow up   #Fever Fever of 101.5 on 9/19. Blood cultures NGTD. Afebrile on 9/20 and this morning, no other symptoms.  - if blood cultures result, will reach out to patient   #Chest Pain Patient came in with CC of CP. Trop 69 downtrended to 29. TTE EF 55-60% with no wall motion abnormalities. Evaluated by cardiology, likely demand ischemia in setting of infection. Patient declining further workup with heart cath. Continued Asprin   #HTN Home meds on admission: Amlodipine 10 , Telmisartan-HCTZ 80-25, and Metoprolol tartrate 25mg .    Discharge Vitals:   BP (!) 113/47 (BP Location: Left Arm)   Pulse (!) 58   Temp 99.7 F (37.6 C) (Oral)   Resp 18   Ht 5\' 6"  (1.676 m)   Wt 83.1 kg   SpO2 96%   BMI 29.55 kg/m   Pertinent Labs, Studies, and Procedures:  CBC Latest Ref Rng & Units 05/26/2019 05/25/2019 05/24/2019  WBC 4.0 - 10.5 K/uL 7.1 8.6 7.7  Hemoglobin 12.0 - 15.0 g/dL 10.3(L)  10.0(L) 10.0(L)  Hematocrit 36.0 - 46.0 % 30.2(L) 31.3(L) 30.5(L)  Platelets 150 - 400 K/uL 158 142(L) 126(L)   BMP Latest Ref Rng & Units 05/25/2019 05/24/2019 05/23/2019  Glucose 70 - 99 mg/dL 108(H) 119(H) 129(H)  BUN 8 - 23 mg/dL 15 19 17   Creatinine 0.44 - 1.00 mg/dL 0.96 1.17(H) 1.20(H)  Sodium 135 - 145 mmol/L 131(L) 135 138  Potassium 3.5 - 5.1 mmol/L 3.4(L) 3.2(L) 3.7  Chloride 98 - 111 mmol/L 100 103 107  CO2 22 - 32 mmol/L 20(L) 22 21(L)  Calcium 8.9 - 10.3 mg/dL 8.4(L) 8.9 9.2   Chest xray 1 view   FINDINGS: Cardiac silhouette is normal in size. No mediastinal or hilar masses and  no evidence of adenopathy. Is   Mild prominence of the bronchovascular markings bilaterally stable from prior exam. Lungs otherwise clear. No convincing pleural effusion or pneumothorax.   Skeletal structures are grossly intact.   IMPRESSION: No acute cardiopulmonary disease.    CT Angio Chest PE  FINDINGS: Cardiovascular: Satisfactory opacification of the pulmonary arteries to the segmental level. No evidence of pulmonary embolism. Enlarged heart size. Trace pericardial effusion.   Mediastinum/Nodes: No enlarged mediastinal, hilar, or axillary lymph nodes. Thyroid gland, trachea, and esophagus demonstrate no significant findings.   Lungs/Pleura: Lungs are clear. Trace bilateral pleural effusion. No pneumothorax.   Upper Abdomen: No acute abnormality.   Musculoskeletal: No chest wall abnormality. No acute or significant osseous findings.   Review of the MIP images confirms the above findings.   IMPRESSION: 1. No evidence of a pulmonary embolus. 2. Trace bilateral pleural effusion.   CT abd/pelvis   FINDINGS: Lower chest: Limited visualization of the lower thorax demonstrates trace bilateral effusions, progressed compared to chest CT performed day prior.   Cardiomegaly. Small pericardial effusion, progressed compared to chest CT performed day prior.   Hepatobiliary:  Nodularity hepatic contour suggestive of cirrhosis. No discrete hepatic lesions. Post cholecystectomy. Common bile duct is dilated measuring 1.2 cm in diameter with associated mild dilatation of the central aspect of the intrahepatic biliary system, presumably the sequela of post cholecystectomy state and biliary reservoir phenomena. There is a trace amount of fluid about the caudal tip of the right lobe of the liver (image 22, series 3).   Pancreas: Normal appearance of the pancreas.   Spleen: Normal appearance of the spleen.  No evidence splenomegaly.   Adrenals/Urinary Tract: There is symmetric enhancement of the bilateral kidneys. Hypoattenuating renal cysts are seen bilaterally. Additional bilateral subcentimeter hypoattenuating renal lesions are too small to adequately characterize of favored to represent additional renal cysts. No discrete worrisome renal lesions. No evidence of nephrolithiasis on this postcontrast examination. No urinary obstruction or perinephric stranding. Excreted contrast is seen within the urinary bladder as a sequela of chest CTA performed day prior.   Normal appearance of the bilateral adrenal glands.   Stomach/Bowel: Ingested enteric contrast extends to the level of the mid/distal small bowel. Moderate amount of air in stool is seen throughout the colon without evidence of enteric obstruction.   The sigmoid colon is noted to be markedly redundant. The cecum is noted to be located within the right mid hemiabdomen. Normal appearance of terminal ileum. The appendix is not visualized, however there is no pericecal inflammatory change. No pneumoperitoneum, pneumatosis or portal venous gas.   Vascular/Lymphatic: Large amount of atherosclerotic plaque within a normal caliber abdominal aorta. The major branch vessels of the abdominal aorta appear patent on this non CTA examination.   No bulky retroperitoneal, mesenteric, pelvic or inguinal  lymphadenopathy.   Reproductive: Post hysterectomy. No discrete adnexal lesion. There is a small amount of fluid within the pelvic cul-de-sac.   Other: Minimal amount subcutaneous edema about the midline of the low back.   Musculoskeletal: Grade 1 anterolisthesis of L5 upon S1 without associated pars defects. Stigmata of DISH within the lower thoracic spine. Degenerative change of the pubic symphysis.   IMPRESSION: 1. Moderate amount of air and stool is seen throughout the colon without evidence of enteric obstruction. 2. Cardiomegaly with interval progression of now small pericardial effusion and trace bilateral pleural effusions. Further evaluation cardiac echo could be performed as clinically indicated. 3. Stigmata of cirrhosis with trace amount of perihepatic ascites.  4. Large amount of atherosclerotic plaque within a normal caliber abdominal aorta. Aortic Atherosclerosis  Discharge Instructions: Discharge Instructions     Ambulatory referral to Physical Therapy   Complete by: As directed    Call MD for:  difficulty breathing, headache or visual disturbances   Complete by: As directed    Call MD for:  extreme fatigue   Complete by: As directed    Call MD for:  hives   Complete by: As directed    Call MD for:  persistant dizziness or light-headedness   Complete by: As directed    Call MD for:  persistant nausea and vomiting   Complete by: As directed    Call MD for:  redness, tenderness, or signs of infection (pain, swelling, redness, odor or green/yellow discharge around incision site)   Complete by: As directed    Call MD for:  severe uncontrolled pain   Complete by: As directed    Call MD for:  temperature >100.4   Complete by: As directed    Diet - low sodium heart healthy   Complete by: As directed    Diet - low sodium heart healthy   Complete by: As directed    Increase activity slowly   Complete by: As directed    Increase activity slowly   Complete by: As  directed        Signed:  Tamsen Snider, MD PGY1  [phone]

## 2019-05-26 NOTE — Evaluation (Signed)
Occupational Therapy Evaluation Patient Details Name: Sheila [name] MRN: [id number] DOB: Nov 03, 1945 Today's Date: 05/26/2019    History of Present Illness 73yo female with recent admit 9/9-9/10/20 due to hepatic encephalopathy, now returning with intense chest pain and elevated troponin. PE negative. Per cardio, likely with demand ischemia in setting of infection. PMH DM, HTN, NASH   Clinical Impression   This 73 y/o female presents with the above. PTA pt reports independence with ADL and functional mobility, reports she lives with her spouse who has dementia but does not require physical assist. Pt requiring minA (HHA) for room level mobility today, pt often seeking at least single UE support on items in room and would likely benefit from AD for additional mobility completion. She currently requires minguard assist for LB and standing grooming ADL, light minA for toileting ADL. Pt reports she has friends who are able to assist intermittently after discharge PRN. She will benefit from continued acute therapy services and recommend follow up [place] services to maximize her safety and independence with ADL and mobility. Will follow.     Follow Up Recommendations  Home health OT;Supervision/Assistance - 24 hour    Equipment Recommendations  Tub/shower seat           Precautions / Restrictions Precautions Precautions: Fall Restrictions Weight Bearing Restrictions: No      Mobility Bed Mobility               General bed mobility comments: pt received OOB in recliner  Transfers Overall transfer level: Needs assistance Equipment used: None Transfers: Sit to/from Stand Sit to Stand: Supervision         General transfer comment: S for safety, often furniture reaching    Balance Overall balance assessment: Needs assistance Sitting-balance support: Feet supported Sitting balance-Leahy Scale: Good     Standing balance support: No upper extremity supported Standing  balance-Leahy Scale: Fair Standing balance comment: often furniture walks                           ADL either performed or assessed with clinical judgement   ADL Overall ADL's : Needs assistance/impaired Eating/Feeding: Independent;Sitting   Grooming: Wash/dry hands;Oral care;Min guard;Standing   Upper Body Bathing: Set up;Sitting   Lower Body Bathing: Sit to/from stand;Min guard   Upper Body Dressing : Set up;Sitting   Lower Body Dressing: Min guard;Sit to/from stand   Toilet Transfer: Minimal assistance;Ambulation;Regular Toilet;Grab bars   Toileting- Clothing Manipulation and Hygiene: Minimal assistance;Sit to/from stand;Sitting/lateral lean Toileting - Clothing Manipulation Details (indicate cue type and reason): some assist for gown management; pt performing peri-care after BM via lateral leans     Functional mobility during ADLs: Minimal assistance(HHA)       Vision         Perception     Praxis      Pertinent Vitals/Pain Pain Assessment: No/denies pain     Hand Dominance Right   Extremity/Trunk Assessment         Cervical / Trunk Assessment Cervical / Trunk Assessment: Normal   Communication Communication Communication: Expressive difficulties   Cognition Arousal/Alertness: Awake/alert Behavior During Therapy: WFL for tasks assessed/performed Overall Cognitive Status: Within Functional Limits for tasks assessed                                     General Comments  transport arriving end of session to  take pt to discharge unit    Exercises     Shoulder Instructions      Home Living Family/patient expects to be discharged to:: Private residence Living Arrangements: Spouse/significant other Available Help at Discharge: Family;Available PRN/intermittently Type of Home: Independent living facility Home Access: Level entry     Home Layout: One level     Bathroom Shower/Tub: Occupational psychologist:  Handicapped height     Home Equipment: None          Prior Functioning/Environment Level of Independence: Independent        Comments: Assisting spouse as needed as he has dementia        OT Problem List: Decreased strength;Decreased range of motion;Decreased activity tolerance;Impaired balance (sitting and/or standing);Decreased knowledge of use of DME or AE      OT Treatment/Interventions: Self-care/ADL training;Therapeutic exercise;Neuromuscular education;DME and/or AE instruction;Therapeutic activities;Patient/family education;Balance training    OT Goals(Current goals can be found in the care plan section) Acute Rehab OT Goals Patient Stated Goal: go home OT Goal Formulation: With patient Time For Goal Achievement: 06/09/19 Potential to Achieve Goals: Good  OT Frequency: Min 2X/week   Barriers to D/C:            Co-evaluation              AM-PAC OT "6 Clicks" Daily Activity     Outcome Measure Help from another person eating meals?: None Help from another person taking care of personal grooming?: None Help from another person toileting, which includes using toliet, bedpan, or urinal?: A Little Help from another person bathing (including washing, rinsing, drying)?: A Little Help from another person to put on and taking off regular upper body clothing?: None Help from another person to put on and taking off regular lower body clothing?: A Little 6 Click Score: 21   End of Session Equipment Utilized During Treatment: Gait belt Nurse Communication: Mobility status  Activity Tolerance: Patient tolerated treatment well Patient left: Other (comment)(taken by NT/RN to discharge unit )  OT Visit Diagnosis: Unsteadiness on feet (R26.81);Muscle weakness (generalized) (M62.81)                Time: 1345-1411 OT Time Calculation (min): 26 min Charges:  OT General Charges $OT Visit: 1 Visit OT Evaluation $OT Eval Moderate Complexity: 1 Mod OT Treatments $Self  Care/Home Management : 8-22 mins  Lou Cal, OT [facility] Rehabilitation Services Pager [phone] Office [place] 05/26/2019, 3:21 PM

## 2019-05-26 NOTE — Progress Notes (Signed)
   Subjective: Patient reports that she is feeling better today. Pt lives at a independent living facility with her husband and manages her own medications. She is concerned with the diarrhea and admittedly doubted the treatment for hepatic encephalopathy . We express concern with her going home and not adhering to the regimen. Plans to arrange good follow-up with possible discharge today. Patient is agreeable.and will have husband pick her up this afternoon if all goes as planned.   Objective:  Vital signs in last 24 hours: Vitals:   05/25/19 0922 05/25/19 1204 05/25/19 2103 05/26/19 0406  BP:  (!) 110/57 (!) 126/51   Pulse: 70 63 61   Resp:  17    Temp:  98.4 F (36.9 C) 98.8 F (37.1 C)   TempSrc:  Oral Oral   SpO2:  97%    Weight:    83.1 kg  Height:       Physical Exam Cardiovascular:     Rate and Rhythm: Normal rate and regular rhythm.     Heart sounds: Murmur present. Systolic murmur present with a grade of 2/6. No diastolic murmur.  Abdominal:     General: Bowel sounds are normal.     Palpations: Abdomen is soft.  Skin:    General: Skin is warm and dry.  Neurological:     Mental Status: She is alert and oriented to person, place, and time.      Assessment/Plan:  Principal Problem:   Chest pain Active Problems:   Hepatic encephalopathy (HCC)   Liver cirrhosis (HCC)   NASH (nonalcoholic steatohepatitis)   NSTEMI (non-ST elevated myocardial infarction) (HCC)   Elevated troponin   Demand ischemia [facility])   Essential hypertension  73 year old woman with PMHx of hypertension, hyperlipidemia, type 2 diabetes, IBS, and NASH cirrhosis recently admitted 9/8-9/10 for encephalopathy and AKI who presented with chest pain concerning for acute coronary syndrome.   #Acute Encephalopathy Patient came in with confusion and asterixis on exam . CT abdomen & pelvis on 9/19 showed cirrhosis with trace perihepatic ascites, no eveidence of obstruction. Confusion gradually improving  with lactulose and Rifaximin. - Follow up with GI as outpatient - Continue Lactulose and Rifaximin   #Fever Fever of 101.5 on 9/19. Blood cultures NGTD. Afebrile on 9/20 and this morning.  - follow blood cultures  #Chest Pain Patient came in with CC of CP. Trop 69 downtrended to 29. TTE EF 55-60% with no wall motion abnormalities. Evaluated by cardiology, likely demand ischemia in setting of infection. Patient declining further workup with heart cath.  - ASA - Stop Imdur  #HTN Home meds on admission: Amlodipine 10 , Telmisartan-HCTZ 80-25, and Metoprolol tartrate 25mg .  - Continue telmisartan-HCTZ - Continue amlodipine 10, metoprolol tartrate 25 - Stop Losartan  Dispo: Anticipated discharge in 0-1 days.   Tamsen Snider, MD PGY1  [phone]

## 2019-05-26 NOTE — TOC Transition Note (Signed)
Transition of Care [facility]) - CM/SW Discharge Note   Patient Details  Name: Sheila [name] MRN: [id number] Date of Birth: 09/14/1945  Transition of [facility] CM/SW Contact:  Zenon Mayo, RN Phone Number: 05/26/2019, 10:03 AM   Clinical Narrative:    Patient is from [facility] ID[facility], she states she has no problem taking her medications and she does not want a HHRN for this, she is under [facility].  She would like to go to [facility]. Outpatient rehab to get outpt PT.  NCM sent referral over thru epic.  She also states she does not want a cane.  She will have one and does not need two canes.  She states she will have transport at discharge , she states MD said she will dc around 3 pm today.   Final next level of care: OP Rehab Barriers to Discharge: No Barriers Identified   Patient Goals and CMS Choice Patient states their goals for this hospitalization and ongoing recovery are:: to get better CMS Medicare.gov Compare Post Acute Care list provided to:: Patient Choice offered to / list presented to : Patient  Discharge Placement                       Discharge Plan and Services                DME Arranged: (NA)         HH Arranged: NA, Patient Refused HH          Social Determinants of Health (SDOH) Interventions     Readmission Risk Interventions Readmission Risk Prevention Plan 05/15/2019  Post Dischage Appt Complete  Medication Screening Complete  Transportation Screening Complete

## 2019-05-26 NOTE — Plan of Care (Signed)

## 2019-05-28 LAB — CULTURE, BLOOD (ROUTINE X 2)
Culture: NO GROWTH
Culture: NO GROWTH
Special Requests: ADEQUATE

## 2019-06-09 ENCOUNTER — Encounter: Payer: Self-pay | Admitting: Physician Assistant

## 2019-06-18 ENCOUNTER — Encounter: Payer: Self-pay | Admitting: Physician Assistant

## 2019-06-18 ENCOUNTER — Ambulatory Visit (INDEPENDENT_AMBULATORY_CARE_PROVIDER_SITE_OTHER): Payer: Medicare Other | Admitting: Physician Assistant

## 2019-06-18 VITALS — BP 130/70 | HR 75 | Temp 98.4°F | Ht 64.5 in | Wt 180.0 lb

## 2019-06-18 DIAGNOSIS — K219 Gastro-esophageal reflux disease without esophagitis: Secondary | ICD-10-CM

## 2019-06-18 DIAGNOSIS — K7460 Unspecified cirrhosis of liver: Secondary | ICD-10-CM | POA: Diagnosis not present

## 2019-06-18 DIAGNOSIS — K7581 Nonalcoholic steatohepatitis (NASH): Secondary | ICD-10-CM | POA: Diagnosis not present

## 2019-06-18 NOTE — Patient Instructions (Signed)
If you are age 75 or older, your body mass index should be between 23-30. Your Body mass index is 30.42 kg/m. If this is out of the aforementioned range listed, please consider follow up with your Primary Care Provider.  If you are age 29 or younger, your body mass index should be between 19-25. Your Body mass index is 30.42 kg/m. If this is out of the aformentioned range listed, please consider follow up with your Primary Care Provider.   You have been scheduled for an endoscopy. Please follow written instructions given to you at your visit today. If you use inhalers (even only as needed), please bring them with you on the day of your procedure. Your physician has requested that you go to www.startemmi.com and enter the access code given to you at your visit today. This web site gives a general overview about your procedure. However, you should still follow specific instructions given to you by our office regarding your preparation for the procedure.  Take Xifaxan 550 mg twice daily.  We are requesting records from [facility] and Dr. Melford Aase.  Thank you for choosing me and [facility].   Ellouise Newer, PA-C

## 2019-06-18 NOTE — Progress Notes (Signed)
Reviewed and agree with management plan.  Benz Vand[place] T. Suleman Gunning, MD FACG [facility]  

## 2019-06-18 NOTE — Progress Notes (Signed)
Chief Complaint: GERD, NASH cirrhosis  HPI:    Sheila [name] is a 73 year old female with a past medical history as listed below including diabetes type 2, hepatitis, IBS, GERD, class I obesity, CKD stage III and others, who was referred to me by Chesley Noon, MD for a complaint of GERD, NASH cirrhosis.      05/13/2019 admission to [facility] for altered mental status and confusion.  Diagnosed with hepatic encephalopathy and acute kidney injury.  Known liver cirrhosis.  Creatinine was 1.59.  Cardiac echo revealed normal LV systolic function with ejection fraction more than 65%.  Ultrasound that time showed cirrhotic change in the liver without focal mass.  Small right renal cyst.    05/22/2019 readmitted to, chest pain, high fever 1-1.5, confusion/altered mental status.  Diagnosed with hepatic encephalopathy again and treated with lactulose and rifaximin.  Has a history of [name] and known liver cirrhosis.  Developed some chest pain worse with inspiration.  Troponins minimally elevated.  Repeat cardiac echo 9/18 with normal LVEF.      05/24/2019 CT abdomen pelvis with contrast with moderate amount of air and stool throughout the colon without evidence of enteric obstruction.  Cardiomegaly.  Small pericardial effusion and trace bilateral pleural effusions.  Stigmata of cirrhosis with trace amount of perihepatic ascites.  Large amount of atherosclerotic plaque within a normal caliber abdominal aorta.  Aortic atherosclerosis.    05/26/2019 CBC with a hemoglobin of 10.3 which appears to be patient's baseline over the past 3 weeks.  BMP with a sodium low 131, potassium low at 3.4.    Today, the patient presents clinic accompanied by her husband and describes her multiple hospital visits as above.  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  She is also not taking her  Xifaxan as it was going to be $600 when she went to pick it up from the pharmacy.  Describes history of being diagnosed with NASH cirrhosis about 15 years ago.  This diagnosis came after a liver biopsy.  Patient states she has moved around multiple times since then has not really followed with any one.  Does tell me she is off of her diuretics after recent hospitalization because of kidney issues.  Denies any current complaints of swelling or abdominal distention.  Does complain of some right upper quadrant abdominal discomfort at times, typically once a day.    Tells me she has chronic reflux for which she is on Omeprazole 40 mg daily, she has been on this for years and typically this controls it.  Maybe 4-5 times a month she will have to take some Tums for intermittent breakthrough symptoms.    History of colonoscopy last April in [place], patient tells me this was normal.    Denies fever, chills, weight loss, anorexia, nausea, vomiting, heartburn, reflux or symptoms that awaken her from sleep.  Past Medical History:  Diagnosis Date  . Diabetes mellitus without complication ([place])   . Hypertension   . NASH (nonalcoholic steatohepatitis)     Past Surgical History:  Procedure Laterality Date  . ABDOMINAL HYSTERECTOMY    . CHOLECYSTECTOMY    . HERNIA REPAIR      Current Outpatient Medications  Medication Sig Dispense Refill  . amLODipine (NORVASC) 10 MG tablet Take 10 mg by mouth daily.    Marland Kitchen  Apoaequorin (PREVAGEN EXTRA STRENGTH) 20 MG CAPS Take 20 mg by mouth daily.    . ARIPiprazole (ABILIFY) 10 MG tablet Take 10 mg by mouth at bedtime.    Marland Kitchen buPROPion (WELLBUTRIN XL) 300 MG 24 hr tablet Take 300 mg by mouth daily.    . Calcium 500-125 MG-UNIT TABS Take 2 tablets by mouth daily.    . clobetasol cream (TEMOVATE) [id number] % Apply 1 application topically daily as needed for rash.    . diclofenac sodium (VOLTAREN) 1 % GEL Apply 2 g topically 2 (two) times daily as needed for pain.    .  diphenoxylate-atropine (LOMOTIL) 2.5-0.025 MG tablet Take 2 tablets by mouth 4 (four) times daily.    Marland Kitchen ezetimibe (ZETIA) 10 MG tablet Take 10 mg by mouth every morning.    . ferrous sulfate 325 (65 FE) MG EC tablet Take 325 mg by mouth 2 (two) times daily.    Marland Kitchen FLUoxetine (PROZAC) 40 MG capsule Take 40 mg by mouth daily.    Marland Kitchen gabapentin (NEURONTIN) 100 MG capsule Take 200 mg by mouth daily.    Marland Kitchen lactulose (CHRONULAC) 10 GM/15ML solution Take 60 mLs (40 g total) by mouth 3 (three) times daily. 236 mL 0  . metoprolol tartrate (LOPRESSOR) 25 MG tablet Take 25 mg by mouth 2 (two) times daily.    Marland Kitchen omeprazole (PRILOSEC) 40 MG capsule Take 40 mg by mouth daily.    Vladimir Faster Glycol-Propyl Glycol (SYSTANE) 0.4-0.3 % SOLN Place 1 drop into both eyes 2 (two) times daily.    . rifaximin (XIFAXAN) 550 MG TABS tablet Take 1 tablet (550 mg total) by mouth 2 (two) times daily. 60 tablet 0  . SitaGLIPtin-MetFORMIN HCl (JANUMET XR) [phone] MG TB24 Take 1 tablet by mouth every evening.    Marland Kitchen telmisartan-hydrochlorothiazide (MICARDIS HCT) 80-25 MG tablet Take 1 tablet by mouth daily.     No current facility-administered medications for this visit.     Allergies as of 06/18/2019 - Review Complete 05/25/2019  Allergen Reaction Noted  . Acetaminophen  09/23/2013  . Cefazolin Other (See Comments) 01/14/2015  . Ciprofloxacin hcl Nausea Only 09/28/2010  . Ibuprofen  09/23/2013  . Sulfa antibiotics Hives 01/06/2014  . Naproxen sodium  12/28/2010  . Amoxicillin Diarrhea 10/12/2014    Family History  Adopted: Yes    Social History   Socioeconomic History  . Marital status: Married    Spouse name: Not on file  . Number of children: Not on file  . Years of education: Not on file  . Highest education level: Not on file  Occupational History  . Not on file  Social Needs  . Financial resource strain: Not on file  . Food insecurity    Worry: Not on file    Inability: Not on file  . Transportation  needs    Medical: Not on file    Non-medical: Not on file  Tobacco Use  . Smoking status: Never Smoker  . Smokeless tobacco: Never Used  Substance and Sexual Activity  . Alcohol use: Never    Frequency: Never    Comment: rarely  . Drug use: Never  . Sexual activity: Not on file  Lifestyle  . Physical activity    Days per week: Not on file    Minutes per session: Not on file  . Stress: Not on file  Relationships  . Social Herbalist on phone: Not on file    Gets together: Not on file  Attends religious service: Not on file    Active member of club or organization: Not on file    Attends meetings of clubs or organizations: Not on file    Relationship status: Not on file  . Intimate partner violence    Fear of current or ex partner: Not on file    Emotionally abused: Not on file    Physically abused: Not on file    Forced sexual activity: Not on file  Other Topics Concern  . Not on file  Social History Narrative  . Not on file    Review of Systems:    Constitutional: No weight loss, fever or chills Skin: No rash  Cardiovascular: No chest pain  Respiratory: No SOB  Gastrointestinal: See HPI and otherwise negative Genitourinary: No dysuria Neurological: No headache, dizziness or syncope Musculoskeletal: No new muscle or joint pain Hematologic: No bleeding  Psychiatric: No history of depression or anxiety   Physical Exam:  Vital signs: BP 130/70   Pulse 75   Temp 98.4 F (36.9 C)   Ht 5' 4.5" (1.638 m) Comment: without shoes  Wt 180 lb (81.6 kg)   BMI 30.42 kg/m   Constitutional:   Pleasant Caucasian female appears to be in NAD, Well developed, Well nourished, alert and cooperative Head:  Normocephalic and atraumatic. Eyes:   PEERL, EOMI. No icterus. Conjunctiva pink. Ears:  Normal auditory acuity. Neck:  Supple Throat: Oral cavity and pharynx without inflammation, swelling or lesion.  Respiratory: Respirations even and unlabored. Lungs clear to  auscultation bilaterally.   No wheezes, crackles, or rhonchi.  Cardiovascular: Normal S1, S2. No MRG. Regular rate and rhythm. No peripheral edema, cyanosis or pallor.  Gastrointestinal:  Soft, nondistended, nontender. No rebound or guarding. Normal bowel sounds. No appreciable masses or hepatomegaly. Rectal:  Not performed.  Msk:  Symmetrical without gross deformities. Without edema, no deformity or joint abnormality.  Neurologic:  Alert and  oriented x4;  grossly normal neurologically.  Skin:   Dry and intact without significant lesions or rashes. Psychiatric:  Demonstrates good judgement and reason without abnormal affect or behaviors.  RELEVANT LABS AND IMAGING: CBC    Component Value Date/Time   WBC 7.1 05/26/2019 0632   RBC 3.32 (L) 05/26/2019 0632   HGB 10.3 (L) 05/26/2019 0632   HCT 30.2 (L) 05/26/2019 0632   PLT 158 05/26/2019 0632   MCV 91.0 05/26/2019 0632   MCH 31.0 05/26/2019 0632   MCHC 34.1 05/26/2019 0632   RDW 18.5 (H) 05/26/2019 0632   LYMPHSABS 1.1 05/13/2019 0847   MONOABS 0.8 05/13/2019 0847   EOSABS 0.3 05/13/2019 0847   BASOSABS 0.1 05/13/2019 0847    CMP     Component Value Date/Time   NA 131 (L) 05/25/2019 0615   K 3.4 (L) 05/25/2019 0615   CL 100 05/25/2019 0615   CO2 20 (L) 05/25/2019 0615   GLUCOSE 108 (H) 05/25/2019 0615   BUN 15 05/25/2019 0615   CREATININE 0.96 05/25/2019 0615   CALCIUM 8.4 (L) 05/25/2019 0615   PROT 7.2 05/23/2019 0331   ALBUMIN 3.2 (L) 05/23/2019 0331   AST 35 05/23/2019 0331   ALT 24 05/23/2019 0331   ALKPHOS 86 05/23/2019 0331   BILITOT 1.4 (H) 05/23/2019 0331   GFRNONAA 58 (L) 05/25/2019 0615   GFRAA >60 05/25/2019 0615    Assessment: 1.  GERD: Mostly controlled on Omeprazole 40 mg daily 2.  NASH cirrhosis: Diagnosed via liver biopsy 15 years ago per patient, has been under  control since then but she has not really followed with anyone in regards to this specifically, 2 admissions recently for hepatic  encephalopathy, taken off her diuretics due to kidney injury  Plan: 1.  Scheduled patient for an EGD in the [place] with Dr. Fuller Plan.  This is for variceal screening.  Discussed risk, benefits, limitations and alternatives the patient agrees to proceed. 2.  Urged the patient to continue on Xifaxan 550 mg twice daily for hepatic encephalopathy.  Especially if she is not on her Lactulose.  Discussed how important it is for her. 3.  For now we will keep the patient off of diuretics as it caused kidney injury recently. 4.  Recent imaging with no HCC, repeat would be due in 6 months. 5.  Requesting records from [facility] regarding last colonoscopy and previous work-up for cirrhosis. 5.  Patient to follow in clinic per recommendations from Dr. Fuller Plan after time of procedure.  Ellouise Newer, PA-C [facility] 06/18/2019, 10:28 AM  Cc: Chesley Noon, MD

## 2019-06-26 ENCOUNTER — Other Ambulatory Visit: Payer: Self-pay | Admitting: Family Medicine

## 2019-06-26 DIAGNOSIS — Z1231 Encounter for screening mammogram for malignant neoplasm of breast: Secondary | ICD-10-CM

## 2019-06-30 ENCOUNTER — Other Ambulatory Visit: Payer: Self-pay

## 2019-06-30 ENCOUNTER — Ambulatory Visit (AMBULATORY_SURGERY_CENTER): Payer: Medicare Other | Admitting: Gastroenterology

## 2019-06-30 ENCOUNTER — Encounter: Payer: Self-pay | Admitting: Gastroenterology

## 2019-06-30 VITALS — BP 130/61 | HR 65 | Temp 98.7°F | Resp 17 | Ht 64.5 in | Wt 180.0 lb

## 2019-06-30 DIAGNOSIS — I8500 Esophageal varices without bleeding: Secondary | ICD-10-CM | POA: Insufficient documentation

## 2019-06-30 DIAGNOSIS — K7460 Unspecified cirrhosis of liver: Secondary | ICD-10-CM

## 2019-06-30 DIAGNOSIS — K7581 Nonalcoholic steatohepatitis (NASH): Secondary | ICD-10-CM

## 2019-06-30 DIAGNOSIS — K3189 Other diseases of stomach and duodenum: Secondary | ICD-10-CM | POA: Diagnosis not present

## 2019-06-30 DIAGNOSIS — K766 Portal hypertension: Secondary | ICD-10-CM

## 2019-06-30 MED ORDER — SODIUM CHLORIDE 0.9 % IV SOLN: 500.0000 mL | Freq: Once | INTRAVENOUS | Status: DC

## 2019-06-30 MED ORDER — FLUCONAZOLE 100 MG PO TABS: 100.0000 mg | ORAL_TABLET | Freq: Every day | ORAL | 0 refills | Status: DC

## 2019-06-30 MED ORDER — NADOLOL 40 MG PO TABS: 40.0000 mg | ORAL_TABLET | Freq: Every day | ORAL | 11 refills | Status: DC

## 2019-06-30 NOTE — Progress Notes (Signed)
Report given to PACU, vss 

## 2019-06-30 NOTE — Op Note (Signed)
[place] Patient Name: Sheila [name] Procedure Date: 06/30/2019 10:08 AM MRN: [id number] Endoscopist: Ladene Artist , MD Age: 73 Referring MD:  Date of Birth: 1946/03/08 Gender: Female Account #: [id number] Procedure:                Upper GI endoscopy Indications:              Screening procedure for varcies, portal                            hypertension in patient with cirrhosis, HE. Medicines:                Monitored Anesthesia Care Procedure:                Pre-Anesthesia Assessment:                           - Prior to the procedure, a History and Physical                            was performed, and patient medications and                            allergies were reviewed. The patient's tolerance of                            previous anesthesia was also reviewed. The risks                            and benefits of the procedure and the sedation                            options and risks were discussed with the patient.                            All questions were answered, and informed consent                            was obtained. Prior Anticoagulants: The patient has                            taken no previous anticoagulant or antiplatelet                            agents. ASA Grade Assessment: III - A patient with                            severe systemic disease. After reviewing the risks                            and benefits, the patient was deemed in                            satisfactory condition to undergo the procedure.  After obtaining informed consent, the endoscope was                            passed under direct vision. Throughout the                            procedure, the patient's blood pressure, pulse, and                            oxygen saturations were monitored continuously. The                            Endoscope was introduced through the mouth, and                            advanced to the  second part of duodenum. The upper                            GI endoscopy was accomplished without difficulty.                            The patient tolerated the procedure well. Scope In: Scope Out: Findings:                 Three columns of non-bleeding grade I varices were                            found in the distal esophagus,. They were 4 mm in                            largest diameter. No stigmata of recent bleeding                            were evident and no red wale signs were present.                           Patchy, yellow plaques were found in the distal                            esophagus.                           The exam of the esophagus was otherwise normal.                           Moderate portal hypertensive gastropathy was found                            in the entire examined stomach.                           A small hiatal hernia was present.                           The duodenal  bulb and second portion of the                            duodenum were normal. Complications:            No immediate complications. Estimated Blood Loss:     Estimated blood loss: none. Impression:               - Non-bleeding grade I esophageal varices.                           - Esophageal plaques were found, consistent with                            candidiasis.                           - Portal hypertensive gastropathy.                           - Small hiatal hernia.                           - Normal duodenal bulb and second portion of the                            duodenum.                           - No specimens collected. Recommendation:           - Patient has a contact number available for                            emergencies. The signs and symptoms of potential                            delayed complications were discussed with the                            patient. Return to normal activities tomorrow.                            Written discharge  instructions were provided to the                            patient.                           - Continue present medications.                           - Nadolol 40 mg po qd, 1 year of refills.                           - Diflucan 100 mg po qd, #7, no refills                           -  Repeat upper endoscopy in 1 year for surveillance.                           - Return to GI office in 6 months. Ladene Artist, MD 06/30/2019 10:27:51 AM This report has been signed electronically.

## 2019-06-30 NOTE — Patient Instructions (Addendum)
Medications x2 sent in to your pharmacy for you to pick up  Repeat upper Endoscopy in 1 year  Return to Dr Lynne Leader office in 6 months for follow up-make this appointment  Esophageal Varices  Esophageal varices are enlarged veins in the part of the body that moves food from the mouth to the stomach (esophagus). They develop when extra blood is forced to flow through these veins because the blood's normal pathway is blocked. Without treatment, esophageal varices eventually break and bleed (hemorrhage), which can be life-threatening. What are the causes? This condition may be caused by:  Scarring of the liver (cirrhosis) due to alcoholism. This is the most common cause.  Long-term (chronic) liver disease.  Severe heart failure.  A blood clot in a vein that supplies the liver (portal vein).  A disease that causes inflammation in the organs and other body areas (sarcoidosis).  A parasitic infection that can cause liver damage (schistosomiasis). What are the signs or symptoms? Esophageal varices usually do not cause symptoms unless they start to bleed. Symptoms of bleeding esophageal varices include:  Vomiting material that is bright red or that is black and looks like coffee grounds.  Coughing up blood.  Stools (feces) that look black and tarry.  Dizziness or light-headedness.  Low blood pressure.  Loss of consciousness. How is this diagnosed? This condition is diagnosed with a procedure called endoscopy. During endoscopy, your health care provider uses a flexible tube with a small camera on the end of it (endoscope) to look down your throat and examine your esophagus. You may also have other tests, including:  Imaging tests such as a CT scan or ultrasound.  Blood tests. How is this treated? This condition may be treated with medicines or procedures that reduce pressure in the varices and reduce the risk of bleeding. Medicines are usually used for varices that are not  bleeding. Procedures that may be done for bleeding varices include:  Placing an elastic band around the varices to keep them from bleeding (variceal ligation).  Replacing blood that you have lost due to bleeding. This may include getting a transfusion of blood or parts of blood, such as platelets or clotting factors.  You may be given antibiotic medicine to help prevent infection.  Getting an injection that causes the varices to shrink and close (sclerotherapy). You may also be given medicines that tighten (constrict) blood vessels or change blood flow.  Placing a tube into your esophagus and then passing a balloon through the tube and inflating the balloon (balloon tamponade). The balloon applies pressure to the bleeding veins to help stop the bleeding.  Placing a small tube within the veins in the liver (transjugular intrahepatic portosystemic shunt, TIPS). This decreases blood flow and pressure in the esophageal varices. If other treatments do not work, you may need a liver transplant. Follow these instructions at home:  Take over-the-counter and prescription medicines only as told by your health care provider.  If you were prescribed an antibiotic medicine, take it as told by your health care provider. Do not stop taking the antibiotic even if you start to feel better.  Do not take any NSAIDs (such as aspirin or ibuprofen) before first getting approval from your health care provider.  Do not drink alcohol.  Return to your normal activities as told by your health care provider. Ask your health care provider what activities are safe for you.  Keep all follow-up visits as told by your health care provider. This is important.  Contact a health care provider if:  You have abdominal pain.  You are unable to eat or drink. Get help right away if:  You have blood in your stool or vomit.  You have stools that look black or tarry.  You have chest pain.  You feel dizzy or have low  blood pressure.  You lose consciousness. These symptoms may represent a serious problem that is an emergency. Do not wait to see if the symptoms will go away. Get medical help right away. Call your local emergency services (911 in the [place] Do not drive yourself to the hospital. Summary  Esophageal varices are enlarged veins in the esophagus, the part of your body that moves food from your mouth to your stomach.   YOU HAD AN ENDOSCOPIC PROCEDURE TODAY AT [facility]:   Refer to the procedure report that was given to you for any specific questions about what was found during the examination.  If the procedure report does not answer your questions, please call your gastroenterologist to clarify.  If you requested that your care partner not be given the details of your procedure findings, then the procedure report has been included in a sealed envelope for you to review at your convenience later.  YOU SHOULD EXPECT: Some feelings of bloating in the abdomen. Passage of more gas than usual.  Walking can help get rid of the air that was put into your GI tract during the procedure and reduce the bloating. If you had a lower endoscopy (such as a colonoscopy or flexible sigmoidoscopy) you may notice spotting of blood in your stool or on the toilet paper. If you underwent a bowel prep for your procedure, you may not have a normal bowel movement for a few days.  Please Note:  You might notice some irritation and congestion in your nose or some drainage.  This is from the oxygen used during your procedure.  There is no need for concern and it should clear up in a day or so.  SYMPTOMS TO REPORT IMMEDIATELY:     Following upper endoscopy (EGD)  Vomiting of blood or coffee ground material  New chest pain or pain under the shoulder blades  Painful or persistently difficult swallowing  New shortness of breath  Fever of 100F or higher  Black, tarry-looking stools  For urgent or emergent  issues, a gastroenterologist can be reached at any hour by calling [phone].   DIET:  We do recommend a small meal at first, but then you may proceed to your regular diet.  Drink plenty of fluids but you should avoid alcoholic beverages for 24 hours.  ACTIVITY:  You should plan to take it easy for the rest of today and you should NOT DRIVE or use heavy machinery until tomorrow (because of the sedation medicines used during the test).    FOLLOW UP: Our staff will call the number listed on your records 48-72 hours following your procedure to check on you and address any questions or concerns that you may have regarding the information given to you following your procedure. If we do not reach you, we will leave a message.  We will attempt to reach you two times.  During this call, we will ask if you have developed any symptoms of COVID 19. If you develop any symptoms (ie: fever, flu-like symptoms, shortness of breath, cough etc.) before then, please call [phone].  If you test positive for Covid 19 in the 2 weeks post  procedure, please call and report this information to [place].    If any biopsies were taken you will be contacted by phone or by letter within the next 1-3 weeks.  Please call us at [phone] if you have not heard about the biopsies in 3 weeks.    SIGNATURES/CONFIDENTIALITY: You and/or your care partner have signed paperwork which will be entered into your electronic medical record.  These signatures attest to the fact that that the information above on your After Visit Summary has been reviewed and is understood.  Full responsibility of the confidentiality of this discharge information lies with you and/or your care-partner.  Without treatment, esophageal varices eventually break and bleed (hemorrhage), which can be life-threatening.  Esophageal varices usually do not cause symptoms unless they start to bleed.  Keep all follow-up visits as told by your health care  provider. This is important. This information is not intended to replace advice given to you by your health care provider. Make sure you discuss any questions you have with your health care provider. Document Released: 11/11/2003 Document Revised: 08/03/2017 Document Reviewed: 05/23/2017 Elsevier Patient Education  2020 [employer].

## 2019-07-02 ENCOUNTER — Telehealth: Payer: Self-pay | Admitting: *Deleted

## 2019-07-02 NOTE — Telephone Encounter (Signed)
  Follow up Call-  Call back number 06/30/2019  Post procedure Call Back phone  # [phone]  Permission to leave phone message Yes     Patient questions:  Do you have a fever, pain , or abdominal swelling? No. Pain Score  0 *  Have you tolerated food without any problems? Yes.    Have you been able to return to your normal activities? yes  Do you have any questions about your discharge instructions: Diet   No. Medications  No. Follow up visit  No.  Do you have questions or concerns about your Care? No.  Actions: * If pain score is 4 or above: 1. No action needed, pain <4.Have you developed a fever since your procedure? no  2.   Have you had an respiratory symptoms (SOB or cough) since your procedure? no  3.   Have you tested positive for COVID 19 since your procedure no  4.   Have you had any family members/close contacts diagnosed with the COVID 19 since your procedure?  no   If yes to any of these questions please route to Joylene John, RN and Alphonsa Gin, Therapist, sports.

## 2019-08-18 ENCOUNTER — Other Ambulatory Visit: Payer: Self-pay

## 2019-08-18 ENCOUNTER — Ambulatory Visit
Admission: RE | Admit: 2019-08-18 | Discharge: 2019-08-18 | Disposition: A | Discharge status: Home / Self Care | Payer: Medicare Other | Source: Ambulatory Visit | Attending: Family Medicine | Admitting: Family Medicine

## 2019-08-18 DIAGNOSIS — Z1231 Encounter for screening mammogram for malignant neoplasm of breast: Secondary | ICD-10-CM

## 2020-02-09 ENCOUNTER — Emergency Department (HOSPITAL_COMMUNITY): Payer: Medicare Other

## 2020-02-09 ENCOUNTER — Other Ambulatory Visit: Payer: Self-pay

## 2020-02-09 ENCOUNTER — Encounter (HOSPITAL_COMMUNITY): Payer: Self-pay | Admitting: Emergency Medicine

## 2020-02-09 ENCOUNTER — Inpatient Hospital Stay (HOSPITAL_COMMUNITY)
Admission: EM | Admit: 2020-02-09 | Discharge: 2020-02-12 | DRG: 442 | Disposition: A | Discharge status: Home Health | Payer: Medicare Other | Attending: Internal Medicine | Admitting: Internal Medicine

## 2020-02-09 DIAGNOSIS — Z66 Do not resuscitate: Secondary | ICD-10-CM | POA: Diagnosis present

## 2020-02-09 DIAGNOSIS — Z87891 Personal history of nicotine dependence: Secondary | ICD-10-CM | POA: Diagnosis not present

## 2020-02-09 DIAGNOSIS — Z8544 Personal history of malignant neoplasm of other female genital organs: Secondary | ICD-10-CM

## 2020-02-09 DIAGNOSIS — I8510 Secondary esophageal varices without bleeding: Secondary | ICD-10-CM | POA: Diagnosis present

## 2020-02-09 DIAGNOSIS — Z886 Allergy status to analgesic agent status: Secondary | ICD-10-CM

## 2020-02-09 DIAGNOSIS — E119 Type 2 diabetes mellitus without complications: Secondary | ICD-10-CM | POA: Diagnosis present

## 2020-02-09 DIAGNOSIS — K589 Irritable bowel syndrome without diarrhea: Secondary | ICD-10-CM | POA: Diagnosis present

## 2020-02-09 DIAGNOSIS — I252 Old myocardial infarction: Secondary | ICD-10-CM

## 2020-02-09 DIAGNOSIS — I313 Pericardial effusion (noninflammatory): Secondary | ICD-10-CM | POA: Diagnosis present

## 2020-02-09 DIAGNOSIS — K7460 Unspecified cirrhosis of liver: Secondary | ICD-10-CM | POA: Diagnosis present

## 2020-02-09 DIAGNOSIS — F0390 Unspecified dementia without behavioral disturbance: Secondary | ICD-10-CM | POA: Diagnosis present

## 2020-02-09 DIAGNOSIS — W19XXXA Unspecified fall, initial encounter: Secondary | ICD-10-CM

## 2020-02-09 DIAGNOSIS — K7581 Nonalcoholic steatohepatitis (NASH): Secondary | ICD-10-CM | POA: Diagnosis present

## 2020-02-09 DIAGNOSIS — Z7984 Long term (current) use of oral hypoglycemic drugs: Secondary | ICD-10-CM | POA: Diagnosis not present

## 2020-02-09 DIAGNOSIS — Z9114 Patient's other noncompliance with medication regimen: Secondary | ICD-10-CM

## 2020-02-09 DIAGNOSIS — Z20822 Contact with and (suspected) exposure to covid-19: Secondary | ICD-10-CM | POA: Diagnosis present

## 2020-02-09 DIAGNOSIS — W1830XA Fall on same level, unspecified, initial encounter: Secondary | ICD-10-CM | POA: Diagnosis present

## 2020-02-09 DIAGNOSIS — K219 Gastro-esophageal reflux disease without esophagitis: Secondary | ICD-10-CM | POA: Diagnosis present

## 2020-02-09 DIAGNOSIS — B961 Klebsiella pneumoniae [K. pneumoniae] as the cause of diseases classified elsewhere: Secondary | ICD-10-CM | POA: Diagnosis present

## 2020-02-09 DIAGNOSIS — K7290 Hepatic failure, unspecified without coma: Secondary | ICD-10-CM | POA: Diagnosis present

## 2020-02-09 DIAGNOSIS — N390 Urinary tract infection, site not specified: Secondary | ICD-10-CM | POA: Diagnosis present

## 2020-02-09 DIAGNOSIS — Z882 Allergy status to sulfonamides status: Secondary | ICD-10-CM

## 2020-02-09 DIAGNOSIS — Y92019 Unspecified place in single-family (private) house as the place of occurrence of the external cause: Secondary | ICD-10-CM

## 2020-02-09 DIAGNOSIS — K7200 Acute and subacute hepatic failure without coma: Principal | ICD-10-CM | POA: Diagnosis present

## 2020-02-09 DIAGNOSIS — E538 Deficiency of other specified B group vitamins: Secondary | ICD-10-CM | POA: Diagnosis present

## 2020-02-09 DIAGNOSIS — Z880 Allergy status to penicillin: Secondary | ICD-10-CM | POA: Diagnosis not present

## 2020-02-09 DIAGNOSIS — K7469 Other cirrhosis of liver: Secondary | ICD-10-CM | POA: Diagnosis not present

## 2020-02-09 DIAGNOSIS — G9340 Encephalopathy, unspecified: Secondary | ICD-10-CM

## 2020-02-09 DIAGNOSIS — I10 Essential (primary) hypertension: Secondary | ICD-10-CM | POA: Diagnosis present

## 2020-02-09 DIAGNOSIS — E785 Hyperlipidemia, unspecified: Secondary | ICD-10-CM | POA: Diagnosis present

## 2020-02-09 DIAGNOSIS — F3181 Bipolar II disorder: Secondary | ICD-10-CM | POA: Diagnosis present

## 2020-02-09 DIAGNOSIS — L8990 Pressure ulcer of unspecified site, unspecified stage: Secondary | ICD-10-CM | POA: Insufficient documentation

## 2020-02-09 DIAGNOSIS — B954 Other streptococcus as the cause of diseases classified elsewhere: Secondary | ICD-10-CM | POA: Diagnosis present

## 2020-02-09 DIAGNOSIS — Z599 Problem related to housing and economic circumstances, unspecified: Secondary | ICD-10-CM

## 2020-02-09 DIAGNOSIS — S0093XA Contusion of unspecified part of head, initial encounter: Secondary | ICD-10-CM | POA: Diagnosis present

## 2020-02-09 DIAGNOSIS — K7682 Hepatic encephalopathy: Secondary | ICD-10-CM | POA: Diagnosis present

## 2020-02-09 DIAGNOSIS — Z881 Allergy status to other antibiotic agents status: Secondary | ICD-10-CM | POA: Diagnosis not present

## 2020-02-09 LAB — CBC
HCT: 33.7 % — ABNORMAL LOW (ref 36.0–46.0)
Hemoglobin: 10.7 g/dL — ABNORMAL LOW (ref 12.0–15.0)
MCH: 29 pg (ref 26.0–34.0)
MCHC: 31.8 g/dL (ref 30.0–36.0)
MCV: 91.3 fL (ref 80.0–100.0)
Platelets: 169 10*3/uL (ref 150–400)
RBC: 3.69 MIL/uL — ABNORMAL LOW (ref 3.87–5.11)
RDW: 17.4 % — ABNORMAL HIGH (ref 11.5–15.5)
WBC: 7 10*3/uL (ref 4.0–10.5)
nRBC: 0 % (ref 0.0–0.2)

## 2020-02-09 LAB — CBG MONITORING, ED
Glucose-Capillary: 131 mg/dL — ABNORMAL HIGH (ref 70–99)
Glucose-Capillary: 125 mg/dL — ABNORMAL HIGH (ref 70–99)

## 2020-02-09 LAB — COMPREHENSIVE METABOLIC PANEL
ALT: 24 U/L (ref 0–44)
AST: 39 U/L (ref 15–41)
Albumin: 3.2 g/dL — ABNORMAL LOW (ref 3.5–5.0)
Alkaline Phosphatase: 168 U/L — ABNORMAL HIGH (ref 38–126)
Anion gap: 15 (ref 5–15)
BUN: 20 mg/dL (ref 8–23)
CO2: 20 mmol/L — ABNORMAL LOW (ref 22–32)
Calcium: 9.9 mg/dL (ref 8.9–10.3)
Chloride: 107 mmol/L (ref 98–111)
Creatinine, Ser: 1.09 mg/dL — ABNORMAL HIGH (ref 0.44–1.00)
GFR calc Af Amer: 58 mL/min — ABNORMAL LOW (ref >60)
GFR calc non Af Amer: 50 mL/min — ABNORMAL LOW (ref >60)
Glucose, Bld: 143 mg/dL — ABNORMAL HIGH (ref 70–99)
Potassium: 4.5 mmol/L (ref 3.5–5.1)
Sodium: 142 mmol/L (ref 135–145)
Total Bilirubin: 0.9 mg/dL (ref 0.3–1.2)
Total Protein: 7.1 g/dL (ref 6.5–8.1)

## 2020-02-09 LAB — TROPONIN I (HIGH SENSITIVITY)
Troponin I (High Sensitivity): 7 ng/L (ref <18)
Troponin I (High Sensitivity): 11 ng/L (ref <18)

## 2020-02-09 LAB — URINALYSIS, ROUTINE W REFLEX MICROSCOPIC
Bilirubin Urine: NEGATIVE
Glucose, UA: NEGATIVE mg/dL
Hgb urine dipstick: NEGATIVE
Ketones, ur: NEGATIVE mg/dL
Nitrite: NEGATIVE
Protein, ur: 100 mg/dL — AB
Specific Gravity, Urine: 1.011 (ref 1.005–1.030)
pH: 7 (ref 5.0–8.0)

## 2020-02-09 LAB — HEMOGLOBIN A1C
Hgb A1c MFr Bld: 6.5 % — ABNORMAL HIGH (ref 4.8–5.6)
Mean Plasma Glucose: 139.85 mg/dL

## 2020-02-09 LAB — AMMONIA: Ammonia: 69 umol/L — ABNORMAL HIGH (ref 9–35)

## 2020-02-09 LAB — SARS CORONAVIRUS 2 BY RT PCR (HOSPITAL ORDER, PERFORMED IN ~~LOC~~ HOSPITAL LAB): SARS Coronavirus 2: NEGATIVE

## 2020-02-09 LAB — GLUCOSE, CAPILLARY
Glucose-Capillary: 112 mg/dL — ABNORMAL HIGH (ref 70–99)
Glucose-Capillary: 128 mg/dL — ABNORMAL HIGH (ref 70–99)

## 2020-02-09 MED ORDER — FENTANYL CITRATE (PF) 100 MCG/2ML IJ SOLN
25.0000 ug | Freq: Once | INTRAMUSCULAR | Status: DC
  Filled 2020-02-09: qty 2

## 2020-02-09 MED ORDER — INSULIN ASPART 100 UNIT/ML ~~LOC~~ SOLN: 0.0000 [IU] | Freq: Every day | SUBCUTANEOUS | Status: DC

## 2020-02-09 MED ORDER — SODIUM CHLORIDE 0.9% FLUSH
3.0000 mL | Freq: Two times a day (BID) | INTRAVENOUS | Status: DC
  Administered 2020-02-09 – 2020-02-12 (×5): 3 mL via INTRAVENOUS

## 2020-02-09 MED ORDER — EZETIMIBE 10 MG PO TABS
10.0000 mg | ORAL_TABLET | Freq: Every morning | ORAL | Status: DC
  Administered 2020-02-10 – 2020-02-12 (×3): 10 mg via ORAL
  Filled 2020-02-09 (×3): qty 1

## 2020-02-09 MED ORDER — HYDROCHLOROTHIAZIDE 25 MG PO TABS
25.0000 mg | ORAL_TABLET | Freq: Every day | ORAL | Status: DC
  Administered 2020-02-10 – 2020-02-12 (×3): 25 mg via ORAL
  Filled 2020-02-09 (×3): qty 1

## 2020-02-09 MED ORDER — ARIPIPRAZOLE 10 MG PO TABS
10.0000 mg | ORAL_TABLET | Freq: Every day | ORAL | Status: DC
  Administered 2020-02-09 – 2020-02-11 (×3): 10 mg via ORAL
  Filled 2020-02-09 (×3): qty 1
  Filled 2020-02-09: qty 2
  Filled 2020-02-09: qty 1

## 2020-02-09 MED ORDER — RIFAXIMIN 550 MG PO TABS
550.0000 mg | ORAL_TABLET | Freq: Two times a day (BID) | ORAL | Status: DC
  Administered 2020-02-09 – 2020-02-12 (×6): 550 mg via ORAL
  Filled 2020-02-09 (×7): qty 1

## 2020-02-09 MED ORDER — TELMISARTAN-HCTZ 80-25 MG PO TABS: 1.0000 | ORAL_TABLET | Freq: Every day | ORAL | Status: DC

## 2020-02-09 MED ORDER — PANTOPRAZOLE SODIUM 40 MG PO TBEC
40.0000 mg | DELAYED_RELEASE_TABLET | Freq: Every day | ORAL | Status: DC
  Administered 2020-02-10 – 2020-02-12 (×3): 40 mg via ORAL
  Filled 2020-02-09 (×3): qty 1

## 2020-02-09 MED ORDER — FUROSEMIDE 20 MG PO TABS
20.0000 mg | ORAL_TABLET | Freq: Every day | ORAL | Status: DC
  Administered 2020-02-09 – 2020-02-12 (×4): 20 mg via ORAL
  Filled 2020-02-09 (×4): qty 1

## 2020-02-09 MED ORDER — INSULIN ASPART 100 UNIT/ML ~~LOC~~ SOLN
0.0000 [IU] | Freq: Three times a day (TID) | SUBCUTANEOUS | Status: DC
  Administered 2020-02-10 – 2020-02-11 (×4): 1 [IU] via SUBCUTANEOUS
  Administered 2020-02-11 – 2020-02-12 (×2): 2 [IU] via SUBCUTANEOUS

## 2020-02-09 MED ORDER — METOPROLOL TARTRATE 25 MG PO TABS
25.0000 mg | ORAL_TABLET | Freq: Two times a day (BID) | ORAL | Status: DC
  Administered 2020-02-09 – 2020-02-12 (×6): 25 mg via ORAL
  Filled 2020-02-09 (×6): qty 1

## 2020-02-09 MED ORDER — ENOXAPARIN SODIUM 40 MG/0.4ML ~~LOC~~ SOLN
40.0000 mg | SUBCUTANEOUS | Status: DC
  Administered 2020-02-09 – 2020-02-12 (×4): 40 mg via SUBCUTANEOUS
  Filled 2020-02-09 (×4): qty 0.4

## 2020-02-09 MED ORDER — SODIUM CHLORIDE 0.9 % IV BOLUS
1000.0000 mL | Freq: Once | INTRAVENOUS | Status: AC
  Administered 2020-02-09: 1000 mL via INTRAVENOUS

## 2020-02-09 MED ORDER — IRBESARTAN 300 MG PO TABS
300.0000 mg | ORAL_TABLET | Freq: Every day | ORAL | Status: DC
  Administered 2020-02-10 – 2020-02-12 (×3): 300 mg via ORAL
  Filled 2020-02-09 (×3): qty 1

## 2020-02-09 MED ORDER — LACTULOSE 10 GM/15ML PO SOLN
20.0000 g | Freq: Two times a day (BID) | ORAL | Status: DC
  Administered 2020-02-09 – 2020-02-10 (×3): 20 g via ORAL
  Filled 2020-02-09 (×5): qty 30

## 2020-02-09 NOTE — ED Triage Notes (Signed)
Pt was in her closet and fell this morning, a standup jewelry box fell over on her hitting her above the right eye.  Bleeding controlled.  According to the spouse she has a hx of dementia and the fact that she is not following directions well and falling asleep often is not a new occurrence.    142/80 HR 74 RR 16 96% RA  CBG 147

## 2020-02-09 NOTE — ED Notes (Signed)
Lunch Tray Ordered @ 1040. 

## 2020-02-09 NOTE — H&P (Signed)
Date: 02/09/2020               Patient Name:  Sheila [name] MRN: [id number]  DOB: 1945-12-09 Age / Sex: 74 y.o., female   PCP: Chesley Noon, MD         Medical Service: Internal Medicine Teaching Service         Attending Physician: Dr. Bartholomew Crews, MD    First Contact: Dr. Marva Panda Pager: [phone]  Second Contact: Dr. Truman Hayward Pager: [phone]       After Hours (After 5p/  First Contact Pager: [phone]  weekends / holidays): Second Contact Pager: [phone]   Chief Complaint: Confusion, Lethargy  History of Present Illness: Sheila [name] is a 74 yo F with Hx of Cirrhosis (2/2 NASH), Memory issues, HTN, DM, Chronic pericardial effusion, Bipolar 2. And Depression who presents following a fall with confusion and lethargy. Patient does not recall events around her fall and her husband was not up yet. She reportedly had fallen in her closet and a jewelry box fell on her head over her right eye. She denies any other symptoms recently including fever, chills, abdominal pain, confusion, headache, urinary symptoms, constipation, diarrhea. ?History of baseline memory disturbance. No diagnosis in care everywhere at recent visits, but is on Abilify. Husband state typically able to get around and is usually Alert and oriented x3. No history of proor falls.   ED workup negative for acute fractures. CT Head WNL. Lab workup showed elevated Ammonia and stable anemia. Otherwise unremarkable. Patient to admitted due to persistent encephalopathy.  Meds:  No current facility-administered medications on file prior to encounter.   Current Outpatient Medications on File Prior to Encounter  Medication Sig  . Apoaequorin (PREVAGEN EXTRA STRENGTH) 20 MG CAPS Take 20 mg by mouth daily.  . ARIPiprazole (ABILIFY) 10 MG tablet Take 10 mg by mouth at bedtime.  Marland Kitchen buPROPion (WELLBUTRIN XL) 300 MG 24 hr tablet Take 300 mg by mouth daily.  . Calcium 500-125 MG-UNIT TABS Take 2 tablets by mouth daily.  . clobetasol  cream (TEMOVATE) 3.53 % Apply 1 application topically daily as needed for rash.  . diclofenac sodium (VOLTAREN) 1 % GEL Apply 2 g topically 2 (two) times daily as needed for pain.  . diphenoxylate-atropine (LOMOTIL) 2.5-0.025 MG tablet Take 2 tablets by mouth 4 (four) times daily.  Marland Kitchen ezetimibe (ZETIA) 10 MG tablet Take 10 mg by mouth every morning.  . ferrous sulfate 325 (65 FE) MG EC tablet Take 325 mg by mouth 2 (two) times daily.  Marland Kitchen FLUoxetine (PROZAC) 40 MG capsule Take 40 mg by mouth daily.  Marland Kitchen gabapentin (NEURONTIN) 100 MG capsule Take 200 mg by mouth daily.  . metoprolol tartrate (LOPRESSOR) 25 MG tablet Take 25 mg by mouth 2 (two) times daily.  . mirabegron ER (MYRBETRIQ) 25 MG TB24 tablet Take by mouth.  . nadolol (CORGARD) 40 MG tablet Take 1 tablet (40 mg total) by mouth daily.  Marland Kitchen omeprazole (PRILOSEC) 40 MG capsule Take 40 mg by mouth daily.  Vladimir Faster Glycol-Propyl Glycol (SYSTANE) 0.4-0.3 % SOLN Place 1 drop into both eyes 2 (two) times daily.  . rifaximin (XIFAXAN) 550 MG TABS tablet Xifaxan 550 mg tablet  TK 1 T PO TID QD FOR 14 DAYS FOR IRRITABLE BOWEL SYNDROME  . SitaGLIPtin-MetFORMIN HCl (JANUMET XR) 240-051-2606 MG TB24 Take 1 tablet by mouth every evening.  Marland Kitchen telmisartan-hydrochlorothiazide (MICARDIS HCT) 80-25 MG tablet Take 1 tablet by mouth daily.  . TRUE METRIX  BLOOD GLUCOSE TEST test strip TEST BS BID   Allergies: Allergies as of 02/09/2020 - Review Complete 02/09/2020  Allergen Reaction Noted  . Acetaminophen  09/23/2013  . Cefazolin Other (See Comments) 01/14/2015  . Ciprofloxacin hcl Nausea Only 09/28/2010  . Ibuprofen  09/23/2013  . Sulfa antibiotics Hives 01/06/2014  . Naproxen sodium  12/28/2010  . Amoxicillin Diarrhea 10/12/2014   Past Medical History:  Diagnosis Date  . Anemia   . Anxiety   . Cataract   . Depression   . Diabetes mellitus without complication ([place])   . Diverticulitis    Bled  . Esophageal varices ([place])   . Gallstones   . GERD  (gastroesophageal reflux disease)   . Heart murmur   . Hx of migraines   . Hyperlipidemia   . Hypertension   . IC (interstitial cystitis)   . Irritable bowel syndrome   . NASH (nonalcoholic steatohepatitis)   . Obesity   . Pneumonia   . PVC (premature ventricular contraction)   . Vulva cancer ([place])     Family History:  Family History  Adopted: Yes   Social History:  Social History   Tobacco Use  . Smoking status: Former Smoker    Quit date: 2015    Years since quitting: 6.4  . Smokeless tobacco: Never Used  Substance Use Topics  . Alcohol use: Never    Comment: rarely  . Drug use: Never   Review of Systems: A complete ROS was negative except as per HPI.  Physical Exam: Blood pressure (!) 149/76, pulse 67, temperature 98.6 F (37 C), temperature source Oral, resp. rate 15, height 5' 5"  (1.651 m), weight 81.6 kg, SpO2 97 %. Physical Exam Constitutional:      General: She is not in acute distress.    Appearance: Normal appearance.  Cardiovascular:     Rate and Rhythm: Normal rate and regular rhythm.     Pulses: Normal pulses.     Heart sounds: Murmur present.  Pulmonary:     Effort: Pulmonary effort is normal. No respiratory distress.     Breath sounds: Normal breath sounds.     Comments: Trace Rales RLL Abdominal:     General: Bowel sounds are normal. There is no distension.     Palpations: Abdomen is soft.     Tenderness: There is no abdominal tenderness.  Musculoskeletal:        General: No swelling or deformity.  Skin:    General: Skin is warm and dry.  Neurological:     Mental Status: Mental status is at baseline.     Comments: Mental Status: Patient is awake, alert, oriented x1 (self only) Cranial Nerves: II: Pupils equal, round III,IV, VI: EOMI  V: Facial sensation is symmetric tolight touch VII: Facial movement is symmetric.  VIII: hearing is intact to voice X: Uvula elevates symmetrically XI: Shoulder shrug is symmetric. XII: tongue is  midline without atrophy or fasciculations.  Motor: decent effort thorughout, at Least 4-5/5 bilateral UE, 4-5/5 bilateral lower extremitiy Sensory: Sensation is grossly intact bilateral UEs & LEs    EKG: personally reviewed my interpretation is sinus rhythm, q-wave in III and AvF  CXR: personally reviewed my interpretation is pericardial enlargement (chronic), No acute abnormality.  DG Pelvis Portable: IMPRESSION: Negative.  CT Head WO, C-Spine, MaxilloFacial: IMPRESSION: - CT of the head: Chronic atrophic and ischemic changes stable from the prior exam. No acute intracranial abnormality noted. - CT of the maxillofacial bones: Mild soft tissue swelling over the  right supraorbital ridge consistent with the given clinical history. No acute bony abnormality is noted. - CT of the cervical spine: Multilevel degenerative change without acute abnormality.  Assessment & Plan by Problem: Active Problems:   Encephalopathy  Encephalopathy: Lethargic and Confused in ED, somewhat chronic, but was walking yesterday. A&O to self only in ED, baseline unclear, possibly some confusion at home per husband but he also has some confusion per his report. No signs of infection, E-lytes good, CT negative. Elevated Ammonia in ED. Given recent head injury and history of cirrhosis suspect concussion vs hepatic encephalopathy. Less likely polypharmacy. - Continue Rifaximin - Lactulose, titrate to 3-4 BMs per day (start at 20g BID)  Fall: Likely mechanical, but no history of prior fall and patient does not remember events leading up to or around fall so cannot rule out possible syncopal event. No murmer on exam. - Cardiac Monitoring - Orthostatics - Had recent Echo OtPt LVEF 65%, Mild chronic pericardial effusion. AV w/ mild AVR, no stenosis. - Continue Metoprolol, hold Dilt to avoid dual anti-nodal for now - PT/OT Eval  Cirrhosis 2/2 [name]: Takes Rifaximin at home (no lactulose). Does not take Lasix at home per  report, but this is on med list. Will give dose today as she is mildly volume up. Mildly large abdomen, Denies prior Paracentesis. - Rifaxamin and lactulose as above - Lasix 37m PO daily, monitor response  Bipolar 2: Per chart. I son multiply central acting medications - Continue Abilify and Bupropion - Hold Fluoxetine and Gabapentin  HTN: On ARB/HCTZ, Metop, Dilt, ?Amlodipine (this was to be stopped per recent notes). - Continue Irbesartan-HCTZ 80-25 - Continue Metoprolol - Hold Diltiazem  Diabetes: On Janumet at home - Hold home meds - SSI-S, qHS  FEN: CM diet VTE ppx: Lovenox  Code Status:DNR  Dispo: Admit patient to Inpatient with expected length of stay greater than 2 midnights.  Signed: MNeva Seat MD 02/09/2020, 10:28 AM

## 2020-02-09 NOTE — Evaluation (Signed)
Occupational Therapy Evaluation Patient Details Name: Sheila [name] MRN: [id number] DOB: [birth year] Today's Date: 02/09/2020    History of Present Illness Sheila [name] is a 74 yo F with Hx of Cirrhosis (2/2 NASH), Memory issues, HTN, DM, Chronic pericardial effusion, Bipolar 2. And Depression who presents following a fall with confusion and lethargy. Found to have encephalopathy possibly due to concussion vs hepatic encephalopathy per chart.   Clinical Impression   This 74 yo female admitted with above presents to acute OT with unknown PLOF except that she was ambulatory pta--pt reported she uses a RW and then later said she did not. Reports she lives alone then reports she lives with her husband. Pt currently is Max A for in and OOB with min A sit<>stand. Moves very slowly and is quite stiff in her LEs. Currently she cannot answer any orientation questions except her name. From an ADL standpoint she is Mod A-Max A. She will benefit from acute OT with follow up at SNF.    Follow Up Recommendations  SNF;Supervision/Assistance - 24 hour    Equipment Recommendations  Other (comment)(TBD next venue)       Precautions / Restrictions Precautions Precautions: Fall Restrictions Weight Bearing Restrictions: No      Mobility Bed Mobility Overal bed mobility: Needs Assistance Bed Mobility: Supine to Sit;Sit to Supine     Supine to sit: Max assist;HOB elevated Sit to supine: Max assist;HOB elevated   General bed mobility comments: initiated with legs and A minimally with trunk  Transfers Overall transfer level: Needs assistance Equipment used: Rolling walker (2 wheeled) Transfers: Sit to/from Stand Sit to Stand: Min assist         General transfer comment: min A to take 3 steps sideways up to Sheila [name] Overall balance assessment: Needs assistance Sitting-balance support: No upper extremity supported;Feet supported Sitting balance-Leahy Scale: Fair     Standing balance  support: Bilateral upper extremity supported Standing balance-Leahy Scale: Poor                             ADL either performed or assessed with clinical judgement   ADL Overall ADL's : Needs assistance/impaired Eating/Feeding: Moderate assistance;Sitting   Grooming: Moderate assistance;Sitting   Upper Body Bathing: Moderate assistance;Sitting   Lower Body Bathing: Maximal assistance Lower Body Bathing Details (indicate cue type and reason): min A sit<>stand Upper Body Dressing : Maximal assistance;Sitting   Lower Body Dressing: Maximal assistance Lower Body Dressing Details (indicate cue type and reason): min A sit<.>stand Toilet Transfer: Minimal assistance;RW;Stand-pivot   Toileting- Clothing Manipulation and Hygiene: Maximal assistance Toileting - Clothing Manipulation Details (indicate cue type and reason): min A sit<>stand             Vision Patient Visual Report: No change from baseline Additional Comments: Right above eye slightly swollen, but not affecting vision            Pertinent Vitals/Pain Pain Assessment: Faces Faces Pain Scale: Hurts little more Pain Location: right head above eye (at rest), back with in and OOB Pain Descriptors / Indicators: Aching;Sore Pain Intervention(s): Limited activity within patient's tolerance;Monitored during session;Repositioned     Hand Dominance Right   Extremity/Trunk Assessment Upper Extremity Assessment Upper Extremity Assessment: Generalized weakness           Communication Communication Communication: Expressive difficulties;Receptive difficulties   Cognition Arousal/Alertness: Lethargic Behavior During Therapy: Flat affect Overall Cognitive Status: No family/caregiver present to determine baseline  cognitive functioning Area of Impairment: Orientation;Memory;Following commands;Safety/judgement;Problem solving                 Orientation Level: Disoriented to;Place;Time;Situation    Memory: Decreased short-term memory Following Commands: Follows one step commands inconsistently;Follows one step commands with increased time Safety/Judgement: Decreased awareness of safety;Decreased awareness of deficits   Problem Solving: Slow processing;Decreased initiation;Difficulty sequencing;Requires verbal cues;Requires tactile cues                Home Living Family/patient expects to be discharged to:: Skilled nursing facility Living Arrangements: Spouse/significant other Available Help at Discharge: Family;Available 24 hours/day   Home Access: Level entry     Home Layout: One level     Bathroom Shower/Tub: Occupational psychologist: Handicapped height     Home Equipment: Environmental consultant - 2 wheels   Additional Comments: Per chart pt reports he has memory issues. On this date he needed A from staff member to find patient's room (staff had to physically walk him to patient's room, he was wondering aimlessly on 2nd floor--pt on 5th). All home information taken from previous chart (husband not present at time of eval)      Prior Functioning/Environment Level of Independence: Independent        Comments: Assisting spouse as needed as he has dementia (from old chart 8 months ago)        OT Problem List: Decreased activity tolerance;Impaired balance (sitting and/or standing);Obesity;Pain;Decreased cognition;Decreased safety awareness      OT Treatment/Interventions: Self-care/ADL training;DME and/or AE instruction;Patient/family education;Balance training;Therapeutic activities    OT Goals(Current goals can be found in the care plan section) Acute Rehab OT Goals Patient Stated Goal: Did not state OT Goal Formulation: Patient unable to participate in goal setting Time For Goal Achievement: 02/23/20 Potential to Achieve Goals: Good  OT Frequency: Min 2X/week   Barriers to D/C: Decreased caregiver support(per old chart and this one, husband reports he has memory  issues)             AM-PAC OT "6 Clicks" Daily Activity     Outcome Measure Help from another person eating meals?: A Lot Help from another person taking care of personal grooming?: A Lot Help from another person toileting, which includes using toliet, bedpan, or urinal?: A Lot Help from another person bathing (including washing, rinsing, drying)?: A Lot Help from another person to put on and taking off regular upper body clothing?: A Lot Help from another person to put on and taking off regular lower body clothing?: A Lot 6 Click Score: 12   End of Session Equipment Utilized During Treatment: Gait belt;Rolling walker Nurse Communication: Mobility status(NT; also will need A for meals)  Activity Tolerance: Patient limited by lethargy Patient left: in bed;with call bell/phone within reach;with bed alarm set  OT Visit Diagnosis: Unsteadiness on feet (R26.81);Other abnormalities of gait and mobility (R26.89);History of falling (Z91.81);Pain Pain - Right/Left: Right Pain - part of body: (above eye and lower back)                Time: 7681-1572 OT Time Calculation (min): 23 min Charges:  OT General Charges $OT Visit: 1 Visit OT Evaluation $OT Eval Moderate Complexity: 1 Mod OT Treatments $Self Care/Home Management : 8-22 mins  [place] Circle, OTR/L Acute [employer] Pager [phone] Office [phone]     Almon Register 02/09/2020, 2:59 PM

## 2020-02-09 NOTE — Progress Notes (Signed)
NEW ADMISSION NOTE New Admission Note:   Arrival Method: E.D. stretcher bed Mental Orientation: Alert to self,lethargic Telemetry: Box # 10 ,calle and confirmed. Assessment: Completed Skin:Stage I on sacral area.MASD on her lower abdominal folds. Swelling and red bruised on her right eye,eye lid. Right forehead-assessed with Anisha R.N. IV:  Right wrist-SL Pain: Denies Tubes: None Safety Measures: Safety Fall Prevention Plan has been given, discussed and signed Admission: Completed [facility] Orientation: Patient has been orientated to the room, unit and staff.  Family: None.  Orders have been reviewed and implemented. Will continue to monitor the patient. Call light has been placed within reach and bed alarm has been activated.   [place], Zenon Mayo, RN

## 2020-02-09 NOTE — ED Provider Notes (Signed)
[facility] EMERGENCY DEPARTMENT Provider Note   CSN: [id number] Arrival date & time: 02/09/20  0405     History Chief Complaint  Patient presents with  . Fall    Sheila [name] is a 74 y.o. female.  HPI LEVEL 5 CAVEAT 39/68 AMS 74 year old female with a history of NSTEMI, DM type II, obesity, hypertension, IBS, GERD presents to the ER after a fall.  History is severely limited as the patient is not following commands and is difficult to arouse, and her husband at bedside is unable to elaborate on what happened as he states that he was asleep and he heard her fall in the closet.  Per triage notes, a standard jewelry box fell over and hit her in the right eye.  She cannot tell me if she is in pain.  Triage note states that she has a history of dementia and that her not following directions and falling asleep is not new, however when I questioned him he denies any history of dementia.  He cannot tell me for level of sleepiness this at baseline.    Past Medical History:  Diagnosis Date  . Anemia   . Anxiety   . Cataract   . Depression   . Diabetes mellitus without complication ([place])   . Diverticulitis    Bled  . Esophageal varices ([place])   . Gallstones   . GERD (gastroesophageal reflux disease)   . Heart murmur   . Hx of migraines   . Hyperlipidemia   . Hypertension   . IC (interstitial cystitis)   . Irritable bowel syndrome   . NASH (nonalcoholic steatohepatitis)   . Obesity   . Pneumonia   . PVC (premature ventricular contraction)   . Vulva cancer [facility])     Patient Active Problem List   Diagnosis Date Noted  . Esophageal varices determined by endoscopy ([place]) 06/30/2019  . Portal hypertensive gastropathy ([place]) 06/30/2019  . Elevated troponin   . Demand ischemia ([place])   . Essential hypertension   . NSTEMI (non-ST elevated myocardial infarction) ([place]) 05/23/2019  . Chest pain 05/22/2019  . Hepatic encephalopathy ([place])   . Liver cirrhosis ([place])   .  NASH (nonalcoholic steatohepatitis)   . Weakness 05/13/2019  . AKI (acute kidney injury) ([place]) 05/13/2019    Past Surgical History:  Procedure Laterality Date  . ABDOMINAL HYSTERECTOMY     total  . CHOLECYSTECTOMY    . COLONOSCOPY    . FOOT SURGERY Bilateral    Bunion  . HERNIA REPAIR       OB History   No obstetric history on file.     Family History  Adopted: Yes    Social History   Tobacco Use  . Smoking status: Former Smoker    Quit date: 2015    Years since quitting: 6.4  . Smokeless tobacco: Never Used  Substance Use Topics  . Alcohol use: Never    Comment: rarely  . Drug use: Never    Home Medications Prior to Admission medications   Medication Sig Start Date End Date Taking? Authorizing Provider  amLODipine (NORVASC) 2.5 MG tablet Take 2.5 mg by mouth daily.    [provider]  Apoaequorin (PREVAGEN EXTRA STRENGTH) 20 MG CAPS Take 20 mg by mouth daily.    [provider]  ARIPiprazole (ABILIFY) 10 MG tablet Take 10 mg by mouth at bedtime.    [provider]  buPROPion (WELLBUTRIN XL) 300 MG 24 hr tablet Take 300 mg  by mouth daily. 04/15/19   [provider]  Calcium 500-125 MG-UNIT TABS Take 2 tablets by mouth daily.    [provider]  clobetasol cream (TEMOVATE) 7.82 % Apply 1 application topically daily as needed for rash. 11/01/16   [provider]  diclofenac sodium (VOLTAREN) 1 % GEL Apply 2 g topically 2 (two) times daily as needed for pain. 09/12/17   [provider]  diphenoxylate-atropine (LOMOTIL) 2.5-0.025 MG tablet Take 2 tablets by mouth 4 (four) times daily. 11/19/17   [provider]  ezetimibe (ZETIA) 10 MG tablet Take 10 mg by mouth every morning. 08/05/18   [provider]  ferrous sulfate 325 (65 FE) MG EC tablet Take 325 mg by mouth 2 (two) times daily. 04/18/19   [provider]  fluconazole (DIFLUCAN) 100 MG tablet Take 1 tablet (100 mg total) by mouth  daily. 06/30/19   Ladene Artist, MD  FLUoxetine (PROZAC) 40 MG capsule Take 40 mg by mouth daily.    [provider]  gabapentin (NEURONTIN) 100 MG capsule Take 200 mg by mouth daily. 02/19/18   [provider]  metoprolol tartrate (LOPRESSOR) 25 MG tablet Take 25 mg by mouth 2 (two) times daily. 08/01/17   [provider]  mirabegron ER (MYRBETRIQ) 25 MG TB24 tablet Take by mouth. 06/23/19   [provider]  nadolol (CORGARD) 40 MG tablet Take 1 tablet (40 mg total) by mouth daily. 06/30/19   Ladene Artist, MD  omeprazole (PRILOSEC) 40 MG capsule Take 40 mg by mouth daily. 12/22/14   [provider]  Polyethyl Glycol-Propyl Glycol (SYSTANE) 0.4-0.3 % SOLN Place 1 drop into both eyes 2 (two) times daily.    [provider]  rifaximin (XIFAXAN) 550 MG TABS tablet Xifaxan 550 mg tablet  TK 1 T PO TID QD FOR 14 DAYS FOR IRRITABLE BOWEL SYNDROME    [provider]  SitaGLIPtin-MetFORMIN HCl (JANUMET XR) [phone] MG TB24 Take 1 tablet by mouth every evening. 11/05/17   [provider]  telmisartan-hydrochlorothiazide (MICARDIS HCT) 80-25 MG tablet Take 1 tablet by mouth daily. 10/01/17   [provider]  TRUE METRIX BLOOD GLUCOSE TEST test strip TEST BS BID 04/30/19   [provider]    Allergies    Acetaminophen, Cefazolin, Ciprofloxacin hcl, Ibuprofen, Sulfa antibiotics, Naproxen sodium, and Amoxicillin  Review of Systems   Review of Systems  Unable to perform ROS: Mental status change    Physical Exam Updated Vital Signs BP (!) 149/76   Pulse 67   Temp 98.6 F (37 C) (Oral)   Resp 15   Ht 5' 5"  (1.651 m)   Wt 81.6 kg   SpO2 97%   BMI 29.94 kg/m   Physical Exam Vitals and nursing note reviewed.  Constitutional:      Appearance: She is well-developed.     Comments: Sleep in the bed, difficult to arouse  HENT:     Head: Normocephalic and atraumatic.     Comments: No of hemotympanum, raccoon  eyes, battle sign.  No mastoid tenderness.  No malocclusion.  No evidence of lacerations, cranial deformities.  No crepitus, step-offs.     Nose: Nose normal.     Mouth/Throat:     Mouth: Mucous membranes are moist.     Pharynx: Oropharynx is clear.  Eyes:     Conjunctiva/sclera: Conjunctivae normal.     Pupils: Pupils are equal, round, and reactive to light.     Comments:  No  evidence of hyphema.   Neck:     Comments: C-collar applied, midline tenderness cannot be assessed Cardiovascular:     Rate and Rhythm: Normal rate and regular rhythm.     Pulses: Normal pulses.     Heart sounds: Normal heart sounds. No murmur.  Pulmonary:     Effort: Pulmonary effort is normal. No respiratory distress.     Breath sounds: Normal breath sounds.  Abdominal:     General: Abdomen is flat.     Palpations: Abdomen is soft.     Tenderness: There is no abdominal tenderness.  Musculoskeletal:        General: No swelling, tenderness or signs of injury.     Cervical back: Neck supple.     Right lower leg: No edema.     Left lower leg: No edema.     Comments: Patient not following commands, not moving any extremities  Skin:    General: Skin is warm and dry.     Capillary Refill: Capillary refill takes less than 2 seconds.     Findings: Lesion present. No bruising.     Comments: Patient with dried blood with a small hematoma above her right eye.  Patient cannot follow commands, cannot assess EOMs.  Neurological:     Comments: A&0 x 0 Patient is asleep in the room, responds to sternal rub, but cannot follow commands, does not respond to questions.  Cannot assess neuro exam     ED Results / Procedures / Treatments   Labs (all labs ordered are listed, but only abnormal results are displayed) Labs Reviewed  COMPREHENSIVE METABOLIC PANEL - Abnormal; Notable for the following components:      Result Value   CO2 20 (*)    Glucose, Bld 143 (*)    Creatinine, Ser 1.09 (*)    Albumin 3.2 (*)     Alkaline Phosphatase 168 (*)    GFR calc non Af Amer 50 (*)    GFR calc Af Amer 58 (*)    All other components within normal limits  CBC - Abnormal; Notable for the following components:   RBC 3.69 (*)    Hemoglobin 10.7 (*)    HCT 33.7 (*)    RDW 17.4 (*)    All other components within normal limits  AMMONIA - Abnormal; Notable for the following components:   Ammonia 69 (*)    All other components within normal limits  CBG MONITORING, ED - Abnormal; Notable for the following components:   Glucose-Capillary 131 (*)    All other components within normal limits  SARS CORONAVIRUS 2 BY RT PCR (HOSPITAL ORDER, [facility])  URINALYSIS, ROUTINE W REFLEX MICROSCOPIC  TROPONIN I (HIGH SENSITIVITY)  TROPONIN I (HIGH SENSITIVITY)    EKG EKG Interpretation  Date/Time:  Monday February 09 2020 07:00:26 EDT Ventricular Rate:  68 PR Interval:    QRS Duration: 114 QT Interval:  478 QTC Calculation: 516 R Axis:   31 Text Interpretation: Sinus rhythm Borderline prolonged PR interval Inferior infarct, old Prolonged QT interval No STEMI Confirmed by Octaviano Glow [phone]) on 02/09/2020 7:21:36 AM   Radiology CT Head Wo Contrast  Result Date: 02/09/2020 CLINICAL DATA:  Recent fall EXAM: CT HEAD WITHOUT CONTRAST CT MAXILLOFACIAL WITHOUT CONTRAST CT CERVICAL SPINE WITHOUT CONTRAST TECHNIQUE: Multidetector CT imaging of the head, cervical spine, and maxillofacial structures were performed using the standard protocol without intravenous contrast. Multiplanar CT image reconstructions of the cervical spine and maxillofacial structures were  also generated. COMPARISON:  05/13/2019 FINDINGS: CT HEAD FINDINGS Brain: No evidence of acute infarction, hemorrhage, hydrocephalus, extra-axial collection or mass lesion/mass effect. Stable chronic atrophic and white matter ischemic changes are noted. Vascular: No hyperdense vessel or unexpected calcification. Skull: Stable thinning over the  parietal convexities is noted unchanged from the prior exam. No acute bony abnormality is noted. Other: None. CT MAXILLOFACIAL FINDINGS Osseous: No fracture or mandibular dislocation. No destructive process. Orbits: Negative. No traumatic or inflammatory finding. Sinuses: Clear. Soft tissues: Very mild soft tissue swelling is noted over the right supraorbital ridge consistent with the given clinical history. No focal hematoma is noted. No other soft tissue abnormality is noted. CT CERVICAL SPINE FINDINGS Alignment: Within normal limits. Skull base and vertebrae: Mild motion artifact is noted. Seven cervical segments are well visualized. Vertebral body height is well maintained. Multilevel facet hypertrophic changes are noted. No acute fracture or acute facet abnormality is noted. Soft tissues and spinal canal: Surrounding soft tissue structures demonstrate diffuse vascular calcification. No focal mass lesion is noted. No other focal abnormality is noted. Upper chest: Visualized lung apices are unremarkable. Other: None IMPRESSION: CT of the head: Chronic atrophic and ischemic changes stable from the prior exam. No acute intracranial abnormality noted. CT of the maxillofacial bones: Mild soft tissue swelling over the right supraorbital ridge consistent with the given clinical history. No acute bony abnormality is noted. CT of the cervical spine: Multilevel degenerative change without acute abnormality. Electronically Signed   By: Inez Catalina M.D.   On: 02/09/2020 08:24   CT Cervical Spine Wo Contrast  Result Date: 02/09/2020 CLINICAL DATA:  Recent fall EXAM: CT HEAD WITHOUT CONTRAST CT MAXILLOFACIAL WITHOUT CONTRAST CT CERVICAL SPINE WITHOUT CONTRAST TECHNIQUE: Multidetector CT imaging of the head, cervical spine, and maxillofacial structures were performed using the standard protocol without intravenous contrast. Multiplanar CT image reconstructions of the cervical spine and maxillofacial structures were also  generated. COMPARISON:  05/13/2019 FINDINGS: CT HEAD FINDINGS Brain: No evidence of acute infarction, hemorrhage, hydrocephalus, extra-axial collection or mass lesion/mass effect. Stable chronic atrophic and white matter ischemic changes are noted. Vascular: No hyperdense vessel or unexpected calcification. Skull: Stable thinning over the parietal convexities is noted unchanged from the prior exam. No acute bony abnormality is noted. Other: None. CT MAXILLOFACIAL FINDINGS Osseous: No fracture or mandibular dislocation. No destructive process. Orbits: Negative. No traumatic or inflammatory finding. Sinuses: Clear. Soft tissues: Very mild soft tissue swelling is noted over the right supraorbital ridge consistent with the given clinical history. No focal hematoma is noted. No other soft tissue abnormality is noted. CT CERVICAL SPINE FINDINGS Alignment: Within normal limits. Skull base and vertebrae: Mild motion artifact is noted. Seven cervical segments are well visualized. Vertebral body height is well maintained. Multilevel facet hypertrophic changes are noted. No acute fracture or acute facet abnormality is noted. Soft tissues and spinal canal: Surrounding soft tissue structures demonstrate diffuse vascular calcification. No focal mass lesion is noted. No other focal abnormality is noted. Upper chest: Visualized lung apices are unremarkable. Other: None IMPRESSION: CT of the head: Chronic atrophic and ischemic changes stable from the prior exam. No acute intracranial abnormality noted. CT of the maxillofacial bones: Mild soft tissue swelling over the right supraorbital ridge consistent with the given clinical history. No acute bony abnormality is noted. CT of the cervical spine: Multilevel degenerative change without acute abnormality. Electronically Signed   By: Inez Catalina M.D.   On: 02/09/2020 08:24   DG Pelvis Portable  Result  Date: 02/09/2020 CLINICAL DATA:  Trauma, fall EXAM: PORTABLE PELVIS 1-2 VIEWS  COMPARISON:  None. FINDINGS: There is no evidence of pelvic fracture or diastasis. No pelvic bone lesions are seen. IMPRESSION: Negative. Electronically Signed   By: Davina Poke D.O.   On: 02/09/2020 07:59   DG Chest Portable 1 View  Result Date: 02/09/2020 CLINICAL DATA:  Fall EXAM: PORTABLE CHEST 1 VIEW COMPARISON:  05/22/2019 FINDINGS: Chronic cardiopericardial enlargement. Aortic atherosclerosis. There is no edema, consolidation, effusion, or pneumothorax. IMPRESSION: No acute finding when compared with a 2020 CT. Electronically Signed   By: Monte Fantasia M.D.   On: 02/09/2020 07:09   CT Maxillofacial Wo Contrast  Result Date: 02/09/2020 CLINICAL DATA:  Recent fall EXAM: CT HEAD WITHOUT CONTRAST CT MAXILLOFACIAL WITHOUT CONTRAST CT CERVICAL SPINE WITHOUT CONTRAST TECHNIQUE: Multidetector CT imaging of the head, cervical spine, and maxillofacial structures were performed using the standard protocol without intravenous contrast. Multiplanar CT image reconstructions of the cervical spine and maxillofacial structures were also generated. COMPARISON:  05/13/2019 FINDINGS: CT HEAD FINDINGS Brain: No evidence of acute infarction, hemorrhage, hydrocephalus, extra-axial collection or mass lesion/mass effect. Stable chronic atrophic and white matter ischemic changes are noted. Vascular: No hyperdense vessel or unexpected calcification. Skull: Stable thinning over the parietal convexities is noted unchanged from the prior exam. No acute bony abnormality is noted. Other: None. CT MAXILLOFACIAL FINDINGS Osseous: No fracture or mandibular dislocation. No destructive process. Orbits: Negative. No traumatic or inflammatory finding. Sinuses: Clear. Soft tissues: Very mild soft tissue swelling is noted over the right supraorbital ridge consistent with the given clinical history. No focal hematoma is noted. No other soft tissue abnormality is noted. CT CERVICAL SPINE FINDINGS Alignment: Within normal limits. Skull  base and vertebrae: Mild motion artifact is noted. Seven cervical segments are well visualized. Vertebral body height is well maintained. Multilevel facet hypertrophic changes are noted. No acute fracture or acute facet abnormality is noted. Soft tissues and spinal canal: Surrounding soft tissue structures demonstrate diffuse vascular calcification. No focal mass lesion is noted. No other focal abnormality is noted. Upper chest: Visualized lung apices are unremarkable. Other: None IMPRESSION: CT of the head: Chronic atrophic and ischemic changes stable from the prior exam. No acute intracranial abnormality noted. CT of the maxillofacial bones: Mild soft tissue swelling over the right supraorbital ridge consistent with the given clinical history. No acute bony abnormality is noted. CT of the cervical spine: Multilevel degenerative change without acute abnormality. Electronically Signed   By: Inez Catalina M.D.   On: 02/09/2020 08:24    Procedures Procedures (including critical care time)  Medications Ordered in ED Medications  fentaNYL (SUBLIMAZE) injection 25 mcg (0 mcg Intravenous Hold 02/09/20 0958)  sodium chloride 0.9 % bolus 1,000 mL (1,000 mLs Intravenous New Bag/Given 02/09/20 5456)    ED Course  I have reviewed the triage vital signs and the nursing notes.  Pertinent labs & imaging results that were available during my care of the patient were reviewed by me and considered in my medical decision making (see chart for details).  Clinical Course as of Feb 08 1005  Mon Feb 09, 2020  0751 73 yo female here with confusion, AMS.  Husband reports he found her in the closet this morning.  There is suspicion she struck her head as she has a hematoma over her right eye.  Patient cannot recall any events and appears acutely confused, somnolent on exam.  Husband states she was not confused yesterday and has been "normal"  this week, but please note, her husband appears to be confused himself and have some  difficulty providing me with history.  The patient denies headache but cannot finish a sentence without drifting off to sleep on exam.  She has no evidence of hip or extremity pain, fracture, or instability.  She is in a C-spine collar.  Pending CTH, C spine, Facial scan.  Also pending ammonia level (hx of hepatic encephalopathy in the past), UA, Trop.  Doubtful of meningitis at this time.  Will re-evaluate    [MT]  0832 Ammonia(!): 69 [MB]  0833 Creatinine(!): 1.09 [MB]    Clinical Course User Index [MB] Garald Balding, PA-C [MT] Wyvonnia Dusky, MD   MDM Rules/Calculators/A&P                     74 year old female presents to the ER after a fall and questionable jewelry box hitting her above the right eye.  The history is severely limited as the patient is difficult to arouse, and does not follow commands or answer questions.  Her husband at bedside appears to be very flustered, and cannot give me much of a history as well.  Per chart review, she does not appear to be on blood thinners.  On arrival to the ED, she is asleep in the bed, awakes to sternal rub and extensive prompting, but cannot follow commands or answer questions.  She is in a c-collar.  Physical exam severely limited.  Full body exam evidence of deformities or bruising.  She does have dried crusted blood right above her eyelid.  There is no evidence of hyphema.  Skull exam without evidence of hemotympanum, battle signs.  She is not moving any of her extremities, but will follow commands.  Basic lab work pending, will CT head, neck and maxillofacial as well as chest x-ray given severely limited history.  8:29 AM: CBC without leukocytosis, hemoglobin of 10.7 which per previous chart review appears stable.  CMP without significant electrolyte abnormalities, mildly decreased CO2 of 20, glucose 143.  Creatinine today 1.09, which appears to be mildly elevated based on chart review.  Normal AST/ALT.  Most notably, ammonia 69 today.  CT  head, cervical spine, maxillofacial without acute abnormalities.  Plain films of the chest and hip also without any acute fractures or dislocations.  UA pending  9:17 AM: On reexamination, the patient has become more alert and oriented, states she "hurts all over".  She is still fairly weak and still dozing in and out of sleep.  Given this, along with her history of encephalopathy, consulted hospitalist service and they will admit for further evaluation and management.  Patient is remained hemodynamically stable throughout the ED course.   Patient was seen and evaluated by Dr. Langston Masker and he is agreable to the above plan  Final Clinical Impression(s) / ED Diagnoses Final diagnoses:  Fall  Encephalopathy    Rx / DC Orders ED Discharge Orders    None       Lyndel Safe 02/09/20 1008    Wyvonnia Dusky, MD 02/09/20 1724

## 2020-02-10 DIAGNOSIS — G9340 Encephalopathy, unspecified: Secondary | ICD-10-CM

## 2020-02-10 DIAGNOSIS — K7469 Other cirrhosis of liver: Secondary | ICD-10-CM

## 2020-02-10 DIAGNOSIS — I10 Essential (primary) hypertension: Secondary | ICD-10-CM

## 2020-02-10 DIAGNOSIS — F3181 Bipolar II disorder: Secondary | ICD-10-CM

## 2020-02-10 LAB — CBC
HCT: 33.1 % — ABNORMAL LOW (ref 36.0–46.0)
Hemoglobin: 10.5 g/dL — ABNORMAL LOW (ref 12.0–15.0)
MCH: 28.8 pg (ref 26.0–34.0)
MCHC: 31.7 g/dL (ref 30.0–36.0)
MCV: 90.7 fL (ref 80.0–100.0)
Platelets: 149 10*3/uL — ABNORMAL LOW (ref 150–400)
RBC: 3.65 MIL/uL — ABNORMAL LOW (ref 3.87–5.11)
RDW: 17.6 % — ABNORMAL HIGH (ref 11.5–15.5)
WBC: 6.8 10*3/uL (ref 4.0–10.5)
nRBC: 0 % (ref 0.0–0.2)

## 2020-02-10 LAB — COMPREHENSIVE METABOLIC PANEL
ALT: 23 U/L (ref 0–44)
AST: 35 U/L (ref 15–41)
Albumin: 2.9 g/dL — ABNORMAL LOW (ref 3.5–5.0)
Alkaline Phosphatase: 100 U/L (ref 38–126)
Anion gap: 11 (ref 5–15)
BUN: 15 mg/dL (ref 8–23)
CO2: 21 mmol/L — ABNORMAL LOW (ref 22–32)
Calcium: 9.2 mg/dL (ref 8.9–10.3)
Chloride: 111 mmol/L (ref 98–111)
Creatinine, Ser: 0.89 mg/dL (ref 0.44–1.00)
GFR calc Af Amer: >60 mL/min (ref >60)
GFR calc non Af Amer: >60 mL/min (ref >60)
Glucose, Bld: 119 mg/dL — ABNORMAL HIGH (ref 70–99)
Potassium: 3.4 mmol/L — ABNORMAL LOW (ref 3.5–5.1)
Sodium: 143 mmol/L (ref 135–145)
Total Bilirubin: 1 mg/dL (ref 0.3–1.2)
Total Protein: 6.6 g/dL (ref 6.5–8.1)

## 2020-02-10 LAB — HIV ANTIBODY (ROUTINE TESTING W REFLEX): HIV Screen 4th Generation wRfx: NONREACTIVE

## 2020-02-10 LAB — GLUCOSE, CAPILLARY
Glucose-Capillary: 126 mg/dL — ABNORMAL HIGH (ref 70–99)
Glucose-Capillary: 136 mg/dL — ABNORMAL HIGH (ref 70–99)
Glucose-Capillary: 124 mg/dL — ABNORMAL HIGH (ref 70–99)
Glucose-Capillary: 163 mg/dL — ABNORMAL HIGH (ref 70–99)

## 2020-02-10 MED ORDER — IBUPROFEN 400 MG PO TABS
400.0000 mg | ORAL_TABLET | Freq: Four times a day (QID) | ORAL | Status: DC | PRN
  Administered 2020-02-10 – 2020-02-12 (×3): 400 mg via ORAL
  Filled 2020-02-10 (×3): qty 1

## 2020-02-10 MED ORDER — VITAMIN B-12 1000 MCG PO TABS: 1000.0000 ug | ORAL_TABLET | Freq: Every day | ORAL | Status: DC

## 2020-02-10 MED ORDER — CYANOCOBALAMIN 1000 MCG/ML IJ SOLN
1000.0000 ug | Freq: Once | INTRAMUSCULAR | Status: AC
  Administered 2020-02-10: 1000 ug via INTRAMUSCULAR
  Filled 2020-02-10: qty 1

## 2020-02-10 MED ORDER — SODIUM CHLORIDE 0.9 % IV SOLN
1.0000 g | INTRAVENOUS | Status: DC
  Administered 2020-02-10 – 2020-02-12 (×3): 1 g via INTRAVENOUS
  Filled 2020-02-10 (×3): qty 10

## 2020-02-10 NOTE — Progress Notes (Signed)
PT Cancellation Note  Patient Details Name: Sheila [name] MRN: [id number] DOB: August 14, 1946   Cancelled Treatment:    Reason Eval/Treat Not Completed: Pain limiting ability to participate;Fatigue/lethargy limiting ability to participate - Pt reporting severe headache and fatigue, declines PT this day. PT to check back tomorrow per pt request.   Marisa Cyphers, PT Acute Rehabilitation Services Pager [phone]  Office [phone]    Roxine Caddy D Elonda Husky 02/10/2020, 3:37 PM

## 2020-02-10 NOTE — Evaluation (Signed)
Physical Therapy Evaluation Patient Details Name: Sheila [name] MRN: [id number] DOB: 1946/07/27 Today's Date: 02/10/2020   History of Present Illness  Sheila [name] is a 74 yo F with Hx of Cirrhosis (2/2 NASH), Memory issues, HTN, DM, Chronic pericardial effusion, Bipolar 2. And Depression who presents following a fall with confusion and lethargy. Found to have encephalopathy possibly due to concussion vs hepatic encephalopathy per chart.  Clinical Impression   Pt admitted with above diagnosis. Comes from home where she lives with her husband in a single level home with one step to enter; Pt is unable to answer questions related to PLOF and home situation; Noteworthy also that her husband wasn't able to give very clear answers re: her PLOF either; Presents to PT with AMS, generalized weakness, decr functional independence with mobility and ADLs; Required min/mod assist with amb with RW; My understanding is that at baseline, she doesn't need an assistive device to walk (will need ot somehow verify this;  Pt currently with functional limitations due to the deficits listed below (see PT Problem List). Pt will benefit from skilled PT to increase their independence and safety with mobility to allow discharge to the venue listed below.    Will need to see if there is anymore family support for Sheila [name] and her husband.     Follow Up Recommendations SNF    Equipment Recommendations  Rolling walker with 5" wheels;3in1 (PT)    Recommendations for Other Services       Precautions / Restrictions Precautions Precautions: Fall Restrictions Weight Bearing Restrictions: No      Mobility  Bed Mobility Overal bed mobility: Needs Assistance Bed Mobility: Supine to Sit     Supine to sit: Mod assist;HOB elevated     General bed mobility comments: initiated with legs and A minimally with trunk  Transfers Overall transfer level: Needs assistance Equipment used: 1 person hand held assist;Rolling  walker (2 wheeled) Transfers: Sit to/from [facility] Sit to Stand: Min assist Stand pivot transfers: Mod assist       General transfer comment: Able to identify and express teh need to urinate; stood with min assist, and then required mod assist to weight shift and turn to sit to [facility]; Stood to RW with min assist, cues for hand placement and safety  Ambulation/Gait Ambulation/Gait assistance: Min assist;Mod assist Gait Distance (Feet): 60 Feet Assistive device: Rolling walker (2 wheeled) Gait Pattern/deviations: Step-through pattern;Decreased step length - right;Decreased step length - left     General Gait Details: Cues for posture and rW proximity; Occasional mod assist to help with RW management; cues to self-monitor for activity tolerance  Stairs            Wheelchair Mobility    Modified Rankin (Stroke Patients Only)       Balance     Sitting balance-Leahy Scale: Fair       Standing balance-Leahy Scale: Poor                               Pertinent Vitals/Pain Pain Assessment: No/denies pain Faces Pain Scale: No hurt Pain Intervention(s): Monitored during session    Home Living Family/patient expects to be discharged to:: Skilled nursing facility Living Arrangements: Spouse/significant other Available Help at Discharge: Family;Available 24 hours/day   Home Access: Stairs to enter   Entrance Stairs-Number of Steps: 1 Home Layout: One level Home Equipment: Walker - 2 wheels Additional Comments: Noted in OT note  that her husband had a very difficult time finding her room yesterday -- staff had to walk him to her room, he was wandering on 2nd floor, pt on 5th floor); Today, noted that her husband was not clear in his answers to questions about PLOF -- question HOH versus his own memory issues; Mr. Slape was able to clearly state that he cannot care for her safely at home    Prior Function Level of Independence: Independent          Comments: (from old chart 8 months ago): pt was Assisting spouse as needed as he has dementia      Hand Dominance   Dominant Hand: Right    Extremity/Trunk Assessment   Upper Extremity Assessment Upper Extremity Assessment: Defer to OT evaluation;Generalized weakness    Lower Extremity Assessment Lower Extremity Assessment: Generalized weakness       Communication   Communication: Expressive difficulties;Receptive difficulties  Cognition Arousal/Alertness: Awake/alert Behavior During Therapy: Flat affect Overall Cognitive Status: No family/caregiver present to determine baseline cognitive functioning Area of Impairment: Orientation;Memory;Following commands;Safety/judgement;Problem solving                 Orientation Level: Disoriented to;Place;Time;Situation   Memory: Decreased short-term memory Following Commands: Follows one step commands inconsistently;Follows one step commands with increased time Safety/Judgement: Decreased awareness of safety;Decreased awareness of deficits   Problem Solving: Slow processing;Decreased initiation;Difficulty sequencing;Requires verbal cues;Requires tactile cues General Comments: Unable to correctly identify her birthday, or that her birthday month is Oct; Unable to generate the answer that she is in the hospital, but when given options to choose from, she correctly identified she is in the hospital      General Comments General comments (skin integrity, edema, etc.): Husband arrived during session    Exercises     Assessment/Plan    PT Assessment Patient needs continued PT services  PT Problem List Decreased strength;Decreased activity tolerance;Decreased balance;Decreased mobility;Decreased coordination;Decreased cognition;Decreased knowledge of use of DME;Decreased safety awareness;Decreased knowledge of precautions       PT Treatment Interventions DME instruction;Gait training;Stair training;Therapeutic  activities;Functional mobility training;Therapeutic exercise;Balance training;Cognitive remediation;Patient/family education    PT Goals (Current goals can be found in the Care Plan section)  Acute Rehab PT Goals Patient Stated Goal: Did not state PT Goal Formulation: With patient Time For Goal Achievement: 02/24/20 Potential to Achieve Goals: Good    Frequency Min 2X/week   Barriers to discharge Other (comment)      Co-evaluation               AM-PAC PT "6 Clicks" Mobility  Outcome Measure Help needed turning from your back to your side while in a flat bed without using bedrails?: A Little Help needed moving from lying on your back to sitting on the side of a flat bed without using bedrails?: A Lot Help needed moving to and from a bed to a chair (including a wheelchair)?: A Lot Help needed standing up from a chair using your arms (e.g., wheelchair or bedside chair)?: A Little Help needed to walk in hospital room?: A Little Help needed climbing 3-5 steps with a railing? : A Lot 6 Click Score: 15    End of Session Equipment Utilized During Treatment: Gait belt Activity Tolerance: Patient tolerated treatment well Patient left: in chair;with call bell/phone within reach;with chair alarm set;with family/visitor present Nurse Communication: Mobility status PT Visit Diagnosis: Other abnormalities of gait and mobility (R26.89);History of falling (Z91.81);Other symptoms and signs involving the nervous system (R29.898)  Time: 0900-0930 PT Time Calculation (min) (ACUTE ONLY): 30 min   Charges:   PT Evaluation $PT Eval Moderate Complexity: 1 Mod PT Treatments $Gait Training: 8-22 mins        Roney Marion, PT  Acute Rehabilitation Services Pager [phone] Office [place] 02/10/2020, 10:45 AM

## 2020-02-10 NOTE — Progress Notes (Signed)
Date: 02/10/2020  Patient name: Sheila [name]  Medical record number: [id number]  Date of birth: [birth year]   I have seen and evaluated Sheila [name] and discussed their care with the Residency Team. Sheila [name] a 74 year old community dwelling woman with history of cirrhosis secondary to [place] with 2 prior admissions for hepatic encephalopathy in 2020.  She was in her home in her usual state of health when she was in her closet trying to get a jewelry box and she fell, resulting in a jewelry box hitting her on the head over her right eye.  She has confusion surrounding the event and [name] unable to give details.  Her husband reportedly also has memory issues and we currently have no collaborating information.  In the ED, she had a CT of the head, maxillofacial CT, and cervical spine CT without any significant new findings.  There were no significant metabolic abnormalities and no infection was found in her ER work-up.  Chart review indicates that she was diagnosed with cirrhosis secondary to [place] in 2005 after a liver biopsy.  Her most recent GI note in October 2020 stated that she was still working as a Cabin crew which [name] why she was not adherent to the lactulose and it also indicated she did take rifaximin due to the high co-pay.  GI did a endoscopy that showed grade 1 varices and she was started on nadolol.  Nadolol [name] on her home medication list.  The EGD also showed candidiasis without any discussion of why she had candidiasis.  I could find no inhaled corticosteroids in her chart.  I can find no HIV testing in her chart.  She had an annual wellness visit in February 2021 which indicated she was working as a Cabin crew without any memory issues.  As the patient continued to have encephalopathy in the ER, we were consulted for admission, work-up, and management.  Vitals:   02/10/20 0404 02/10/20 0849  BP: (!) 170/79 (!) 164/79  Pulse: 69 60  Resp: 18 20  Temp: 98.8 F (37.1 C) 99.2 F (37.3 C)    SpO2: 97% 96%  General sitting in bed in no acute distress Neuro alert, unable to state name, place, or time.  Able to state that she lives with her husband but was unable to tell me where she lives.  Was unable to remember her test code to enter her cell phone.  Fine tremor at baseline at rest.  Mild bilateral asterixis H RRR grade 2 out of 6 systolic murmur Respiratory no overt respiratory distress, no tachypnea, CTA B anteriorly Abdomen positive bowel sounds Extremities trace pitting edema bilaterally, +2 DP pulses, no deformities, no tenderness Skin warm and dry, no rashes  Pertinent labs Albumin 2.9 Ammonia 60 Hemoglobin 10.5, MCV 91, RDW 17.6 Vitamin B12 184 in September A1c 6.5 Covid negative Small leukocyte, 100 protein, 11-20 WBCs, negative nitrite Urine culture greater than 100,000 colonies GNR's  I personally viewed the CXR images and confirmed my reading with the official read.  1 view AP portable semierect, overpenetrated, aortic calcifications, no significant change   I personally viewed the EKG and confirmed my reading with the official read.  Sinus rhythm, normal axis, inferior Q waves but unchanged from prior, no acute ischemic changes  Assessment and Plan: I have seen and evaluated the patient as outlined above. I agree with the formulated Assessment and Plan as detailed in the residents' note, with the following changes: Sheila [name] [name] a 74 year old community dwelling woman with  history of cirrhosis secondary to [place] with 2 prior admissions for hepatic encephalopathy in 2020.  Other causes of her encephalopathy were considered including infection, she may have a UTI but we are unable to assess for symptoms, electrolyte abnormalities, medications, and insults.  The top 2 differentials are UTI and hepatic encephalopathy.  Her prior 2 admissions for hepatic encephalopathy only required lactulose and rifaximin with subsequent return to baseline.  We are unable to get  information about adherence to lactulose and rifaximin but based on chart review, she has had difficulty with adherence.  Nonadherence could be her trigger for her acute hepatic encephalopathy but so did the UTI.  Therefore, I would favor starting Rocephin and follow-up culture sensitivities.  If she does not improve with antibiotics, lactulose, and rifaximin, we will need to expand her differential.  1.  Acute encephalopathy -Continue rifaximin and lactulose -Treat possible underlying UTI  2.  Possible UTI -Once mental status clears, assess her symptoms -Start Rocephin -Follow-up culture results  3.  Cirrhosis and grade 1 esophageal varices -Continue nadolol  4.  Vitamin B12 deficiency --IM vitamin B12 x1 dose since we are unable to get history regarding B12 use  5.  History of esophageal candidiasis in October 2020 -Check HIV  Bartholomew Crews, MD 6/8/202111:55 AM

## 2020-02-10 NOTE — Progress Notes (Signed)
Subjective: HD 1 Overnight, no acute events reported.  Mrs.Siple was examined and evaluated at bedside this am. She was noted to be resting comfortably in bed. On orientation question, she was unable to answer any of her questions. She is able to answer yes/no questions and nods to feeling sleepy. Denies any pain. She was able to follow directions.   Objective:  Vital signs in last 24 hours: Vitals:   02/09/20 2053 02/10/20 0049 02/10/20 0404 02/10/20 0500  BP: (!) 173/80 (!) 162/71 (!) 170/79   Pulse: 69 66 69   Resp: 18 18 18    Temp: 99.3 F (37.4 C) 98.4 F (36.9 C) 98.8 F (37.1 C)   TempSrc: Oral Oral Oral   SpO2: 97% 97% 97%   Weight:    81.6 kg  Height:       CBC Latest Ref Rng & Units 02/10/2020 02/09/2020 05/26/2019  WBC 4.0 - 10.5 K/uL 6.8 7.0 7.1  Hemoglobin 12.0 - 15.0 g/dL 10.5(L) 10.7(L) 10.3(L)  Hematocrit 36.0 - 46.0 % 33.1(L) 33.7(L) 30.2(L)  Platelets 150 - 400 K/uL 149(L) 169 158   CMP Latest Ref Rng & Units 02/10/2020 02/09/2020 05/25/2019  Glucose 70 - 99 mg/dL 119(H) 143(H) 108(H)  BUN 8 - 23 mg/dL 15 20 15   Creatinine 0.44 - 1.00 mg/dL 0.89 1.09(H) 0.96  Sodium 135 - 145 mmol/L 143 142 131(L)  Potassium 3.5 - 5.1 mmol/L 3.4(L) 4.5 3.4(L)  Chloride 98 - 111 mmol/L 111 107 100  CO2 22 - 32 mmol/L 21(L) 20(L) 20(L)  Calcium 8.9 - 10.3 mg/dL 9.2 9.9 8.4(L)  Total Protein 6.5 - 8.1 g/dL 6.6 7.1 -  Total Bilirubin 0.3 - 1.2 mg/dL 1.0 0.9 -  Alkaline Phos 38 - 126 U/L 100 168(H) -  AST 15 - 41 U/L 35 39 -  ALT 0 - 44 U/L 23 24 -   Physical Exam  Constitutional: Appears well-developed and well-nourished. No distress.  Head: Normocephalic. Noted to have non-bleeding lesion above R eyebrow without surrounding erythema or edema Eyes: Conjunctivae are normal. EOMI, MMM Cardiovascular: RRR, S1 and S2 present, no murmurs,rubs or gallops  Respiratory: Effort normal and breath sounds normal. No respiratory distress. No wheezing, rhonchi or rales on exam.  GI:  Nondistended, soft. Bowel sounds are normal. No tenderness to palpation Musculoskeletal: Trace pitting edema of bilateral lower extremities   Neurological: Alert and pleasant but remains disoriented to self, place, or situation. No apparent focal deficits noted; mild asterixis on exam.  Skin: Warm and non-diaphoretic. No erythema or rash noted  Assessment/Plan: Ms. Laiche is a 74 yr old female with PMHx of cirrhosis 2/2 NASH, chronic memory problems, bipolar II disorder, chronic pericardial effusion, hypertension and diabetes presents after a fall with confusion and lethargy.   Encephalopathy: Cirrhosis 2/2 NASH:  Patient presented with confusion and lethargy suspected to be secondary to hepatic encephalopathy in setting of cirrhosis vs concussion from head trauma/fall. Patient is on rifaximin for her cirrhosis and was started on lactulose with goal of 3-4BMs per day. Patient has not had a bowel movement yet. She remains alert but disoriented to self, place or situation. Mild asterixis noted on examination.  DDx for her encephalopathy includes hepatic encephalopathy, concussion, possible syncope vs seizure with prolonged post-ictal state, stroke. No focal deficits noted on examination suggestive of acute stroke. No benefit to obtaining EEG at this point as no concern for ongoing seizures. Will continue with cardiac monitoring to assess for any arrhythmias that could have caused syncope.  Suspect her encephalopathy is likely hepatic in nature given elevated ammonia and mild asterixis.  - Continue rifaximin 546m bid - Lactulose 20g bid for goal 2-3 BM's per day - Continue cardiac monitoring - PT/OT eval - Continue to monitor mental status   Bipolar II: - Continue ability and bupropion - Continue to hold fluoxetine and gabapentin  Hypertension:  - Continue irbesartan-HCTZ - Continue metoprolol 210mbid  - Continue lasix 2060maily  Diabetes:  - Continue CBG monitoring  - Continue SSI    FEN/GI: Diet: CM Fluids: None Electrolytes: Monitor and replete prn  DVT Prophylaxis: Lovenox Code status: DNR  Prior to Admission Living Arrangement: Home Anticipated Discharge Location: SNF Barriers to Discharge: Continued medical management  Dispo: Anticipated discharge in approximately 2-3 day(s).   AslHarvie HeckD  Internal Medicine, PGY-1 02/10/2020, 6:47 AM Pager: [phone] 5pm on weekdays and 1pm on weekends: On Call pager [phone]

## 2020-02-11 DIAGNOSIS — L8990 Pressure ulcer of unspecified site, unspecified stage: Secondary | ICD-10-CM | POA: Insufficient documentation

## 2020-02-11 DIAGNOSIS — N390 Urinary tract infection, site not specified: Secondary | ICD-10-CM

## 2020-02-11 LAB — URINE CULTURE: Culture: >=100000 — AB

## 2020-02-11 LAB — CBC
HCT: 34 % — ABNORMAL LOW (ref 36.0–46.0)
Hemoglobin: 10.6 g/dL — ABNORMAL LOW (ref 12.0–15.0)
MCH: 28.8 pg (ref 26.0–34.0)
MCHC: 31.2 g/dL (ref 30.0–36.0)
MCV: 92.4 fL (ref 80.0–100.0)
Platelets: 151 10*3/uL (ref 150–400)
RBC: 3.68 MIL/uL — ABNORMAL LOW (ref 3.87–5.11)
RDW: 17.9 % — ABNORMAL HIGH (ref 11.5–15.5)
WBC: 6.6 10*3/uL (ref 4.0–10.5)
nRBC: 0 % (ref 0.0–0.2)

## 2020-02-11 LAB — COMPREHENSIVE METABOLIC PANEL
ALT: 22 U/L (ref 0–44)
AST: 36 U/L (ref 15–41)
Albumin: 2.8 g/dL — ABNORMAL LOW (ref 3.5–5.0)
Alkaline Phosphatase: 93 U/L (ref 38–126)
Anion gap: 10 (ref 5–15)
BUN: 19 mg/dL (ref 8–23)
CO2: 20 mmol/L — ABNORMAL LOW (ref 22–32)
Calcium: 9.1 mg/dL (ref 8.9–10.3)
Chloride: 110 mmol/L (ref 98–111)
Creatinine, Ser: 1.07 mg/dL — ABNORMAL HIGH (ref 0.44–1.00)
GFR calc Af Amer: 59 mL/min — ABNORMAL LOW (ref >60)
GFR calc non Af Amer: 51 mL/min — ABNORMAL LOW (ref >60)
Glucose, Bld: 105 mg/dL — ABNORMAL HIGH (ref 70–99)
Potassium: 3.4 mmol/L — ABNORMAL LOW (ref 3.5–5.1)
Sodium: 140 mmol/L (ref 135–145)
Total Bilirubin: 0.9 mg/dL (ref 0.3–1.2)
Total Protein: 6.3 g/dL — ABNORMAL LOW (ref 6.5–8.1)

## 2020-02-11 LAB — GLUCOSE, CAPILLARY
Glucose-Capillary: 83 mg/dL (ref 70–99)
Glucose-Capillary: 151 mg/dL — ABNORMAL HIGH (ref 70–99)
Glucose-Capillary: 140 mg/dL — ABNORMAL HIGH (ref 70–99)
Glucose-Capillary: 167 mg/dL — ABNORMAL HIGH (ref 70–99)

## 2020-02-11 LAB — HIV ANTIBODY (ROUTINE TESTING W REFLEX): HIV Screen 4th Generation wRfx: NONREACTIVE

## 2020-02-11 MED ORDER — POLYETHYLENE GLYCOL 3350 17 G PO PACK
17.0000 g | PACK | Freq: Every day | ORAL | Status: DC
  Administered 2020-02-11 – 2020-02-12 (×2): 17 g via ORAL
  Filled 2020-02-11 (×2): qty 1

## 2020-02-11 MED ORDER — BISACODYL 5 MG PO TBEC
5.0000 mg | DELAYED_RELEASE_TABLET | Freq: Once | ORAL | Status: AC
  Administered 2020-02-11: 5 mg via ORAL
  Filled 2020-02-11: qty 1

## 2020-02-11 MED ORDER — FLUOXETINE HCL 20 MG PO CAPS
40.0000 mg | ORAL_CAPSULE | Freq: Every day | ORAL | Status: DC
  Administered 2020-02-11 – 2020-02-12 (×2): 40 mg via ORAL
  Filled 2020-02-11 (×2): qty 2

## 2020-02-11 MED ORDER — BUPROPION HCL ER (XL) 150 MG PO TB24
300.0000 mg | ORAL_TABLET | Freq: Every day | ORAL | Status: DC
  Administered 2020-02-11 – 2020-02-12 (×2): 300 mg via ORAL
  Filled 2020-02-11 (×2): qty 2

## 2020-02-11 MED ORDER — LACTULOSE 10 GM/15ML PO SOLN
30.0000 g | Freq: Two times a day (BID) | ORAL | Status: DC
  Administered 2020-02-11 – 2020-02-12 (×3): 30 g via ORAL
  Filled 2020-02-11 (×3): qty 45

## 2020-02-11 MED ORDER — GABAPENTIN 100 MG PO CAPS
200.0000 mg | ORAL_CAPSULE | Freq: Every day | ORAL | Status: DC
  Administered 2020-02-11 – 2020-02-12 (×2): 200 mg via ORAL
  Filled 2020-02-11 (×2): qty 2

## 2020-02-11 NOTE — Progress Notes (Signed)
  Date: 02/11/2020  Patient name: Sheila [name]  Medical record number: [id number]  Date of birth: Jun 08, 1946        I have seen and evaluated this patient and I have discussed the plan of care with the house staff. Please see their note for complete details. I concur with their findings with the following additions/corrections: [name] was seen this morning on team rounds.  Her husband was at the bedside.  Her confusion is both subjectively and objectively significantly improved today although she states that she is still slightly confused.  In addition to not being able to afford the rifaximin, she is also not taking the lactulose due to its side effect of increased bowel movements/diarrhea.  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  We explained that her altered mental status was likely hepatic encephalopathy triggered by her UTI and that we need to treat both.  We are collaborating with our pharmacy colleagues to find a way to reduce her $800 co-pay for rifaximin.  She denies taking vitamin B12 but states that she will take it orally at home.  It is still not clear why she had esophageal candidiasis in October.  Her HIV screen was negative.  Due to residual confusion and no bowel movement yet since admission, likely discharge in the morning.  Bartholomew Crews, MD 02/11/2020, 11:47 AM

## 2020-02-11 NOTE — Progress Notes (Signed)
Physical Therapy Treatment Patient Details Name: Sheila [name] MRN: [id number] DOB: 1946/07/24 Today's Date: 02/11/2020    History of Present Illness Sheila [name] is a 74 yo F with Hx of Cirrhosis (2/2 NASH), Memory issues, HTN, DM, Chronic pericardial effusion, Bipolar 2. And Depression who presents following a fall with confusion and lethargy. Found to have encephalopathy possibly due to concussion vs hepatic encephalopathy per chart.    PT Comments    Continuing work on functional mobility and activity tolerance;  Notable improvements in mental status, which translated well into functional mobility and problem solving; Able to walk with the RW with minguard assist; at this time, recommend RW for bil UE support for steadiness and safety with amb -- next session will consider RW vs rollator   Follow Up Recommendations  Home health PT;Other (comment)(HHRN for assist with med management)     Equipment Recommendations  Rolling walker with 5" wheels;3in1 (PT);Other (comment)(Reg RW versus Rollator)    Recommendations for Other Services       Precautions / Restrictions Precautions Precautions: Fall Precaution Comments: Fall risk greatly reduced with RW    Mobility  Bed Mobility Overal bed mobility: Needs Assistance Bed Mobility: Supine to Sit     Supine to sit: Min guard     General bed mobility comments: No physical assist needed  Transfers Overall transfer level: Needs assistance Equipment used: Rolling walker (2 wheeled) Transfers: Sit to/from Stand Sit to Stand: Min assist         General transfer comment: Cues for hand placement and safety  Ambulation/Gait Ambulation/Gait assistance: Min assist;Min guard Gait Distance (Feet): 150 Feet Assistive device: Rolling walker (2 wheeled) Gait Pattern/deviations: Step-through pattern;Decreased step length - right;Decreased step length - left     General Gait Details: Cues for posture and rW proximity; Occasional mod  assist to help with RW management; cues to self-monitor for activity tolerance   Stairs             Wheelchair Mobility    Modified Rankin (Stroke Patients Only)       Balance     Sitting balance-Leahy Scale: Fair       Standing balance-Leahy Scale: Poor                              Cognition Arousal/Alertness: Awake/alert Behavior During Therapy: WFL for tasks assessed/performed Overall Cognitive Status: Within Functional Limits for tasks assessed(for simple mobility)                                 General Comments: Much improved cognition      Exercises      General Comments General comments (skin integrity, edema, etc.): Daughter Sheila [name] present for session; she will stay in town untill Friday      Pertinent Vitals/Pain Pain Assessment: No/denies pain Faces Pain Scale: No hurt    Home Living                      Prior Function            PT Goals (current goals can now be found in the care plan section) Acute Rehab PT Goals Patient Stated Goal: Hopes to be able to go home PT Goal Formulation: With patient Time For Goal Achievement: 02/24/20 Potential to Achieve Goals: Good Progress towards PT goals: Progressing toward goals  Frequency    Min 3X/week      PT Plan Discharge plan needs to be updated;Frequency needs to be updated;Equipment recommendations need to be updated    Co-evaluation              AM-PAC PT "6 Clicks" Mobility   Outcome Measure  Help needed turning from your back to your side while in a flat bed without using bedrails?: None Help needed moving from lying on your back to sitting on the side of a flat bed without using bedrails?: None Help needed moving to and from a bed to a chair (including a wheelchair)?: A Little Help needed standing up from a chair using your arms (e.g., wheelchair or bedside chair)?: A Little Help needed to walk in hospital room?: A Little Help needed  climbing 3-5 steps with a railing? : A Lot 6 Click Score: 19    End of Session Equipment Utilized During Treatment: Gait belt Activity Tolerance: Patient tolerated treatment well Patient left: in chair;with call bell/phone within reach;with chair alarm set;with family/visitor present Nurse Communication: Mobility status PT Visit Diagnosis: Other abnormalities of gait and mobility (R26.89);History of falling (Z91.81);Other symptoms and signs involving the nervous system (R29.898)     Time: 8916-9450 PT Time Calculation (min) (ACUTE ONLY): 37 min  Charges:  $Gait Training: 23-37 mins                     Roney Marion, [place]  Acute Rehabilitation Services Pager [phone] Office [place] 02/11/2020, 3:55 PM

## 2020-02-11 NOTE — Progress Notes (Signed)
I discussed this with the Transitions of Care office and pharmacist. We discovered that currently, the patient is covered by Medicare. Due to Medicare coverage, she would be ineligible to use the discount card provided by the drug manufacturer company because of her insurance not being considered Pharmacist, community. She could be paying the $800 to meet her deductible, which once she has met her deductible, she will then have to pay for all of her medications out of pocket if she were to "fall into the donut hole." Based on her insurance and the cost of the drug, the $800 is most likely the price she will pay out of pocket. Transitions of Care office did offer to run a test script. Still, due to the patient's insurance, it is believed that the price she is currently paying is the lowest price available and may be ineligible for other discount programs. The patient is also currently prescribed Lactulose. Dosing recommendations for the use of Lactulose in its role in "overt" episodes of hepatic encephalopathy would see the patient receiving Lactulose 16.7 grams (75m) every 1-2 hours until at least two soft or loose bowel movements are produced daily. For minimal hepatic encephalopathy, the recommendation is 20 to 40 grams (30 to 60 mL) daily in two to three divided doses to produce two to three bowel movements daily for up to three months. The patient should be informed that there are GoodRx coupons are ranging from $10-$20. With all the information provided, this should help guide the team in providing the patient with clarity of the price.  MNeomia Glass Student Pharmacist

## 2020-02-11 NOTE — Progress Notes (Signed)
Occupational Therapy Treatment Patient Details Name: Sheila [name] MRN: [id number] DOB: 01-26-1946 Today's Date: 02/11/2020    History of present illness Sheila [name] is a 74 yo F with Hx of Cirrhosis (2/2 NASH), Memory issues, HTN, DM, Chronic pericardial effusion, Bipolar 2. And Depression who presents following a fall with confusion and lethargy. Found to have encephalopathy possibly due to concussion vs hepatic encephalopathy per chart.   OT comments  Pt received semi-reclined in bed. Pt c/o mild pain/stiffness in BLEs. Assessed BADLs, bed mobility, and functional mobility/transfers. Pt was Mod I for bed mobility using extended time/bed railing for support. Pt required min assist for power up and min guard for functional transfers once steady using RW for support. Pt required min guard-supervision for toileting tasks and grooming at sink using RW for support and verbal cues for sequencing/safety. Pt continues to have impaired memory and processing skills. Continuing to recommend SNF and 24/7 supervision for safety. Will continue to follow acutely as able.   Follow Up Recommendations  SNF;Supervision/Assistance - 24 hour    Equipment Recommendations  Other (comment)(defer to SNF)    Recommendations for Other Services      Precautions / Restrictions Precautions Precautions: Fall Precaution Comments: Fall risk greatly reduced with RW Restrictions Weight Bearing Restrictions: No       Mobility Bed Mobility Overal bed mobility: Modified Independent Bed Mobility: Supine to Sit;Sit to Supine     Supine to sit: Modified independent (Device/Increase time);HOB elevated Sit to supine: Modified independent (Device/Increase time);HOB elevated   General bed mobility comments: No physical assist needed; extended time secondary to stiffness in BLEs  Transfers Overall transfer level: Needs assistance Equipment used: Rolling walker (2 wheeled) Transfers: Sit to/from Stand Sit to Stand: Min  guard;Min assist;From elevated surface         General transfer comment: min assist for initial power up; once standing min guard    Balance Overall balance assessment: Needs assistance Sitting-balance support: No upper extremity supported;Feet supported Sitting balance-Leahy Scale: Good     Standing balance support: Bilateral upper extremity supported;During functional activity Standing balance-Leahy Scale: Fair                             ADL either performed or assessed with clinical judgement   ADL Overall ADL's : Needs assistance/impaired Eating/Feeding: Set up;Bed level Eating/Feeding Details (indicate cue type and reason): pt required assistance opening juice container secondary to decreased dexterity and sequencing Grooming: Wash/dry hands;Min guard;Standing Grooming Details (indicate cue type and reason): pt stood at sink to wash hands with min guard for balance and min verbal cues for sequencing hand washing steps                  Toilet Transfer: Min guard;Cueing for safety;Cueing for sequencing;Ambulation;Comfort height toilet;Grab bars;RW Armed forces technical officer Details (indicate cue type and reason): pt transferred on/off toilet with min guard using RW/grab bar for support and min verbal cues for RW management Toileting- Clothing Manipulation and Hygiene: Supervision/safety;Min guard;Cueing for safety;Cueing for sequencing;Sitting/lateral lean;Sit to/from stand Toileting - Clothing Manipulation Details (indicate cue type and reason): pt required min guard assist for standing balance to perform perineal care and v/c's for sequencing toileting hygiene steps     Functional mobility during ADLs: Min guard;Cueing for safety;Cueing for sequencing;Rolling walker General ADL Comments: pt requires extended time for cognitive processing      Vision       Perception  Praxis      Cognition Arousal/Alertness: Awake/alert Behavior During Therapy: WFL for  tasks assessed/performed Overall Cognitive Status: Within Functional Limits for tasks assessed                                 General Comments: Much improved cognition; still having difficulty recalling fall and processing multi-step commands        Exercises     Shoulder Instructions       General Comments Daughter Sheila [name] present for session; she will stay in town untill Friday    Pertinent Vitals/ Pain       Pain Assessment: No/denies pain Faces Pain Scale: Hurts a little bit Pain Location: BLEs Pain Descriptors / Indicators: Aching;Sore;Tightness Pain Intervention(s): Monitored during session;Repositioned  Home Living                                          Prior Functioning/Environment              Frequency  Min 2X/week        Progress Toward Goals  OT Goals(current goals can now be found in the care plan section)  Progress towards OT goals: Progressing toward goals  Acute Rehab OT Goals Patient Stated Goal: Hopes to be able to go home ADL Goals Pt Will Perform Grooming: with set-up;with supervision;sitting Pt Will Perform Upper Body Bathing: with set-up;with supervision;sitting Pt Will Perform Lower Body Bathing: with min assist Pt Will Perform Upper Body Dressing: with set-up;with supervision;sitting Pt Will Perform Lower Body Dressing: with min assist Pt Will Transfer to Toilet: with supervision;ambulating;bedside commode Pt Will Perform Toileting - Clothing Manipulation and hygiene: with supervision;sit to/from stand Additional ADL Goal #1: Pt will be oriented x4 (using external cues prn) Additional ADL Goal #2: Pt will be S in and OOB for basic ADLs  Plan Discharge plan remains appropriate    Co-evaluation                 AM-PAC OT "6 Clicks" Daily Activity     Outcome Measure   Help from another person eating meals?: A Little Help from another person taking care of personal grooming?: A Little Help  from another person toileting, which includes using toliet, bedpan, or urinal?: A Little Help from another person bathing (including washing, rinsing, drying)?: A Lot Help from another person to put on and taking off regular upper body clothing?: A Little Help from another person to put on and taking off regular lower body clothing?: A Lot 6 Click Score: 16    End of Session Equipment Utilized During Treatment: Gait belt;Rolling walker  OT Visit Diagnosis: Unsteadiness on feet (R26.81);Other abnormalities of gait and mobility (R26.89);History of falling (Z91.81);Pain Pain - part of body: (BLEs)   Activity Tolerance Patient tolerated treatment well   Patient Left in bed;with call bell/phone within reach;with bed alarm set   Nurse Communication Mobility status        Time: 3254-9826 OT Time Calculation (min): 31 min  Charges: OT General Charges $OT Visit: 1 Visit OT Treatments $Self Care/Home Management : 23-37 mins  Michel Bickers, OTR/L [facility] [phone]   Francesca Jewett 02/11/2020, 6:23 PM

## 2020-02-11 NOTE — Plan of Care (Signed)
  Problem: Activity: Goal: Risk for activity intolerance will decrease Outcome: Progressing   Problem: Coping: Goal: Level of anxiety will decrease Outcome: Progressing   Problem: Safety: Goal: Ability to remain free from injury will improve Outcome: Progressing   

## 2020-02-11 NOTE — Progress Notes (Addendum)
Subjective: HD 2 Overnight, no acute events reported.  Sheila [name] was examined and evaluated at bedside this morning. She was observed resting comfortably in bed. She states that she cannot remember what caused her to have loss of consciousness as she cannot recall the events of her fall. She is AAOx3 to name, date and location. She mentions some dysuria which she attributes to the [place]. She mentions hx of prior bladder infections. She cannot recall if she had any dysuria shortly before admission. She mentions that she cannot take rifaximin at home due to high co-pays but mentions that she did take lactulose at home. Denies any B12 supplementation at home. She mentions hx of constipation and difficulty with bowel movements at home. She has not had a bm since admission.  Objective:  Vital signs in last 24 hours: Vitals:   02/10/20 1638 02/10/20 2101 02/11/20 0403 02/11/20 0500  BP: (!) 165/81 (!) 144/71 (!) 157/78   Pulse: 64 (!) 56 (!) 54   Resp: 18 16 16    Temp: 99.5 F (37.5 C) 98.5 F (36.9 C) 98.2 F (36.8 C)   TempSrc: Oral Oral Oral   SpO2: 94% 95% 94%   Weight:  78 kg  78 kg  Height:       CBC Latest Ref Rng & Units 02/11/2020 02/10/2020 02/09/2020  WBC 4.0 - 10.5 K/uL 6.6 6.8 7.0  Hemoglobin 12.0 - 15.0 g/dL 10.6(L) 10.5(L) 10.7(L)  Hematocrit 36.0 - 46.0 % 34.0(L) 33.1(L) 33.7(L)  Platelets 150 - 400 K/uL 151 149(L) 169   CMP Latest Ref Rng & Units 02/11/2020 02/10/2020 02/09/2020  Glucose 70 - 99 mg/dL 105(H) 119(H) 143(H)  BUN 8 - 23 mg/dL 19 15 20   Creatinine 0.44 - 1.00 mg/dL 1.07(H) 0.89 1.09(H)  Sodium 135 - 145 mmol/L 140 143 142  Potassium 3.5 - 5.1 mmol/L 3.4(L) 3.4(L) 4.5  Chloride 98 - 111 mmol/L 110 111 107  CO2 22 - 32 mmol/L 20(L) 21(L) 20(L)  Calcium 8.9 - 10.3 mg/dL 9.1 9.2 9.9  Total Protein 6.5 - 8.1 g/dL 6.3(L) 6.6 7.1  Total Bilirubin 0.3 - 1.2 mg/dL 0.9 1.0 0.9  Alkaline Phos 38 - 126 U/L 93 100 168(H)  AST 15 - 41 U/L 36 35 39  ALT 0 - 44 U/L 22 23  24    Physical Exam  Constitutional: Appears well-developed and well-nourished. No distress.  Head: Normocephalic. Non-bleeding above R eyebrow with bruising noted around the R eye Eyes: Conjunctivae are normal. EOMI, bruising noted around R eye Cardiovascular: RRR, S1 and S2 present, no murmurs,rubs or gallops  Respiratory: Effort normal and breath sounds normal. No respiratory distress. No wheezing, rhonchi or rales on exam.  GI: Distended but soft, normoactive bowel sounds; No tenderness to palpation Musculoskeletal: Trace pitting edema of bilateral lower extremities   Neurological: Alert and oriented to self, place, time and situation; No apparent focal deficits noted; mild asterixis on exam.  Skin: Warm and non-diaphoretic. No erythema or rash noted  Assessment/Plan: Sheila [name] is a 74 yr old female with PMHx of cirrhosis 2/2 NASH, chronic memory problems, bipolar II disorder, chronic pericardial effusion, hypertension and diabetes presents after a fall with confusion and lethargy.   Encephalopathy 2/2 cirrhosis and UTI:  Patient presented with confusion and lethargy suspected to be secondary to hepatic encephalopathy in setting of cirrhosis vs concussion from head trauma/fall. Patient on lactulose and rifaximin for hepatic encephalopathy, no bowel movement yet. Noted to have pyuria with urine cx positive for Klebsiella sp  for which she was started on Rocephin yesterday. Patient with significantly improved mental status today. Continues to have mild asterixis on examination with slowed mentation. Suspect her encephalopathy was multifocal in setting of UTI and cirrhosis.  Patient does note medication noncompliance with lactulose and rifaxamin due to cost. Will have pharmacy student investigate any programs to assist with lowering cost of medication to improve compliance.  - Continue rifaximin 541m bid - Increase Lactulose to 30g bid for goal 2-3 BM's per day - Dulcolax 559mx1 - PT/OT  eval  Bipolar II: - Continue abilify and bupropion - Resume fluoxetine and gabapentin  Hypertension:  - Continue irbesartan-HCTZ - Continue metoprolol 2568mid  - Continue lasix 64m70mily  Diabetes:  - Continue CBG monitoring  - Continue SSI   FEN/GI: Diet: CM Fluids: None Electrolytes: Monitor and replete prn  DVT Prophylaxis: Lovenox Code status: DNR  Prior to Admission Living Arrangement: Home Anticipated Discharge Location: SNF Barriers to Discharge: Continued medical management  Dispo: Anticipated discharge in approximately 1-2 day(s).   AslaHarvie Heck  Internal Medicine, PGY-1 02/11/2020, 6:55 AM Pager: [phone] 5pm on weekdays and 1pm on weekends: On Call pager [phone]

## 2020-02-12 LAB — COMPREHENSIVE METABOLIC PANEL
ALT: 24 U/L (ref 0–44)
AST: 53 U/L — ABNORMAL HIGH (ref 15–41)
Albumin: 2.8 g/dL — ABNORMAL LOW (ref 3.5–5.0)
Alkaline Phosphatase: 95 U/L (ref 38–126)
Anion gap: 10 (ref 5–15)
BUN: 16 mg/dL (ref 8–23)
CO2: 22 mmol/L (ref 22–32)
Calcium: 9.1 mg/dL (ref 8.9–10.3)
Chloride: 109 mmol/L (ref 98–111)
Creatinine, Ser: 0.86 mg/dL (ref 0.44–1.00)
GFR calc Af Amer: >60 mL/min (ref >60)
GFR calc non Af Amer: >60 mL/min (ref >60)
Glucose, Bld: 92 mg/dL (ref 70–99)
Potassium: 3.9 mmol/L (ref 3.5–5.1)
Sodium: 141 mmol/L (ref 135–145)
Total Bilirubin: 0.8 mg/dL (ref 0.3–1.2)
Total Protein: 6.2 g/dL — ABNORMAL LOW (ref 6.5–8.1)

## 2020-02-12 LAB — CBC
HCT: 33 % — ABNORMAL LOW (ref 36.0–46.0)
Hemoglobin: 10.6 g/dL — ABNORMAL LOW (ref 12.0–15.0)
MCH: 28.8 pg (ref 26.0–34.0)
MCHC: 32.1 g/dL (ref 30.0–36.0)
MCV: 89.7 fL (ref 80.0–100.0)
Platelets: 167 10*3/uL (ref 150–400)
RBC: 3.68 MIL/uL — ABNORMAL LOW (ref 3.87–5.11)
RDW: 17.4 % — ABNORMAL HIGH (ref 11.5–15.5)
WBC: 6.7 10*3/uL (ref 4.0–10.5)
nRBC: 0 % (ref 0.0–0.2)

## 2020-02-12 LAB — GLUCOSE, CAPILLARY
Glucose-Capillary: 99 mg/dL (ref 70–99)
Glucose-Capillary: 151 mg/dL — ABNORMAL HIGH (ref 70–99)

## 2020-02-12 MED ORDER — LACTULOSE 10 GM/15ML PO SOLN: 30.0000 g | Freq: Two times a day (BID) | ORAL | 0 refills | Status: DC

## 2020-02-12 MED ORDER — CEPHALEXIN 500 MG PO CAPS: 500.0000 mg | ORAL_CAPSULE | Freq: Four times a day (QID) | ORAL | 0 refills | Status: AC

## 2020-02-12 NOTE — Progress Notes (Signed)
Subjective: HD 3 Overnight, no acute events reported.  Sheila [name] was examined and evaluated at bedside this morning. She reports feeling well and back to baseline. She does continue to have some dysuria, but slightly improved from before. Plan to continue antibiotics on discharge. Discussed continuing lactulose on discharge with goal of 2-3 bowel movements per day to prevent hepatic encephalopathy. Patient expresses understanding and is agreeable with the plan.   Objective:  Vital signs in last 24 hours: Vitals:   02/11/20 1700 02/11/20 2040 02/11/20 2041 02/12/20 0519  BP: (!) 144/75  (!) 176/79 (!) 153/68  Pulse: 66  65 (!) 57  Resp: 19  16 (!) 22  Temp: 98 F (36.7 C)  99 F (37.2 C) 99.5 F (37.5 C)  TempSrc: Oral  Oral Oral  SpO2: 98%  96% 93%  Weight:  89 kg    Height:       CBC Latest Ref Rng & Units 02/12/2020 02/11/2020 02/10/2020  WBC 4.0 - 10.5 K/uL 6.7 6.6 6.8  Hemoglobin 12.0 - 15.0 g/dL 10.6(L) 10.6(L) 10.5(L)  Hematocrit 36 - 46 % 33.0(L) 34.0(L) 33.1(L)  Platelets 150 - 400 K/uL 167 151 149(L)   CMP Latest Ref Rng & Units 02/12/2020 02/11/2020 02/10/2020  Glucose 70 - 99 mg/dL 92 105(H) 119(H)  BUN 8 - 23 mg/dL 16 19 15   Creatinine 0.44 - 1.00 mg/dL 0.86 1.07(H) 0.89  Sodium 135 - 145 mmol/L 141 140 143  Potassium 3.5 - 5.1 mmol/L 3.9 3.4(L) 3.4(L)  Chloride 98 - 111 mmol/L 109 110 111  CO2 22 - 32 mmol/L 22 20(L) 21(L)  Calcium 8.9 - 10.3 mg/dL 9.1 9.1 9.2  Total Protein 6.5 - 8.1 g/dL 6.2(L) 6.3(L) 6.6  Total Bilirubin 0.3 - 1.2 mg/dL 0.8 0.9 1.0  Alkaline Phos 38 - 126 U/L 95 93 100  AST 15 - 41 U/L 53(H) 36 35  ALT 0 - 44 U/L 24 22 23    Physical Exam: Gen: Well-developed, well nourished, NAD HEENT: Normocephalic, stable nonbleeding lesion and bruising of R eye, hearing intact, EOMI, MMM Pulm: Breathing comfortably on room air, no cough, no distress  Extm: ROM intact, Peripheral pulses intact, No peripheral edema Skin: Dry, Warm, normal turgor Neuro:  AAOx3, Answers questions appropriately Psych: Normal mood and affect    Assessment/Plan: Sheila [name] is a 74 yr old female with PMHx of cirrhosis 2/2 NASH, chronic memory problems, bipolar II disorder, chronic pericardial effusion, hypertension and diabetes presents after a fall with confusion and lethargy.   Encephalopathy 2/2 cirrhosis and UTI:  Patient presented with confusion and lethargy suspected to be secondary to hepatic encephalopathy in setting of cirrhosis vs concussion from head trauma/fall. Also noted to have pyuria with urine cx positive for Klebsiella sp and Strep viridans for which patient started on Rocephin 6/8. Mental status back to baseline today and slightly improved dysuria. Patient had bowel movement overnight.  Patient with history of noncompliance with lactulose and rifaxamin due to cost. Discussed with pharmacy and patient. Lowest possible cost for the rifaxamin is $800 for patient. However, she can get lactulose for $10-20 with goodRx. At this time, will continue patient on lactulose.  - Continue lactulose 59m bid for goal of 2-3 BM's per day - Keflex [id number] for 2 days to complete 5 day course of antibiotics  - HH PT   Bipolar II: - Continue home medications on discharge   Hypertension:  - Continue irbesartan-HCTZ - Continue metoprolol 2560mid  - Continue lasix 50m76m  daily  FEN/GI: Diet: CM Fluids: None Electrolytes: Monitor and replete prn  DVT Prophylaxis: Lovenox Code status: DNR  Prior to Admission Living Arrangement: Home Anticipated Discharge Location: Home w/HH PT  Barriers to Discharge: None  Dispo: Anticipated discharge today  Harvie Heck, MD  Internal Medicine, PGY-1 02/12/2020, 7:50 AM Pager: [phone] After 5pm on weekdays and 1pm on weekends: On Call pager [phone]

## 2020-02-12 NOTE — Plan of Care (Signed)

## 2020-02-12 NOTE — Plan of Care (Signed)
  Problem: Activity: Goal: Risk for activity intolerance will decrease Outcome: Progressing   Problem: Elimination: Goal: Will not experience complications related to bowel motility Outcome: Progressing   

## 2020-02-12 NOTE — Discharge Summary (Signed)
Name: Sheila [name] MRN: [id number] DOB: 09/03/1946 74 y.o. PCP: Chesley Noon, MD  Date of Admission: 02/09/2020  4:05 AM Date of Discharge: 02/12/2020 Attending Physician: Dr. Larey Dresser  Discharge Diagnosis: 1. Encephalopathy in setting of cirrhosis and UTI 2. Bipolar II disorder 3. Hypertension   Discharge Medications: Allergies as of 02/12/2020      Reactions   Acetaminophen    Other reaction(s): Other Patient avoids Tylenol due to fatty liver disease   Cefazolin Other (See Comments)   Other reaction(s): Other caused C-DIF caused C-DIF c dif caused C-DIF   Ciprofloxacin Hcl Nausea Only   Ibuprofen    Other reaction(s): Other Diabetic--Concerns about kidneys   Sulfa Antibiotics Hives   Naproxen Sodium    Causes pain in veins   Amoxicillin Diarrhea   Did it involve swelling of the face/tongue/throat, SOB, or low BP? No Did it involve sudden or severe rash/hives, skin peeling, or any reaction on the inside of your mouth or nose? No Did you need to seek medical attention at a hospital or doctor's office? No When did it last happen?several years If all above answers are "NO", may proceed with cephalosporin use.      Medication List    STOP taking these medications   rifaximin 550 MG Tabs tablet Commonly known as: XIFAXAN     TAKE these medications   ARIPiprazole 10 MG tablet Commonly known as: ABILIFY Take 10 mg by mouth at bedtime.   buPROPion 300 MG 24 hr tablet Commonly known as: WELLBUTRIN XL Take 300 mg by mouth daily.   Calcium 500-125 MG-UNIT Tabs Take 2 tablets by mouth daily.   clobetasol cream 0.05 % Commonly known as: TEMOVATE Apply 1 application topically daily as needed for rash.   diclofenac sodium 1 % Gel Commonly known as: VOLTAREN Apply 2 g topically 2 (two) times daily as needed for pain.   diphenoxylate-atropine 2.5-0.025 MG tablet Commonly known as: LOMOTIL Take 2 tablets by mouth 4 (four) times daily.     ezetimibe 10 MG tablet Commonly known as: ZETIA Take 10 mg by mouth every morning.   ferrous sulfate 325 (65 FE) MG EC tablet Take 325 mg by mouth 2 (two) times daily.   FLUoxetine 40 MG capsule Commonly known as: PROZAC Take 40 mg by mouth daily.   gabapentin 100 MG capsule Commonly known as: NEURONTIN Take 200 mg by mouth daily.   Janumet XR [id number] MG Tb24 Generic drug: SitaGLIPtin-MetFORMIN HCl Take 1 tablet by mouth every evening.   lactulose 10 GM/15ML solution Commonly known as: CHRONULAC Take 45 mLs (30 g total) by mouth 2 (two) times daily.   metoprolol tartrate 25 MG tablet Commonly known as: LOPRESSOR Take 25 mg by mouth 2 (two) times daily.   mirabegron ER 25 MG Tb24 tablet Commonly known as: MYRBETRIQ Take by mouth.   nadolol 40 MG tablet Commonly known as: Corgard Take 1 tablet (40 mg total) by mouth daily.   omeprazole 40 MG capsule Commonly known as: PRILOSEC Take 40 mg by mouth daily.   Prevagen Extra Strength 20 MG Caps Generic drug: Apoaequorin Take 20 mg by mouth daily.   Systane 0.4-0.3 % Soln Generic drug: Polyethyl Glycol-Propyl Glycol Place 1 drop into both eyes 2 (two) times daily.   telmisartan-hydrochlorothiazide 80-25 MG tablet Commonly known as: MICARDIS HCT Take 1 tablet by mouth daily.   True Metrix Blood Glucose Test test strip Generic drug: glucose blood TEST BS BID     ASK  your doctor about these medications   cephALEXin 500 MG capsule Commonly known as: Keflex Take 1 capsule (500 mg total) by mouth 4 (four) times daily for 2 days. Ask about: Should I take this medication?       Disposition and follow-up:   [name] was discharged from [facility] in Stable condition.  At the hospital follow up visit please address:  1.  Encephalopathy 2/2 UTI and cirrhosis: Patient discharged on keflex 59m qid x2 days to complete 5 day course of antibiotics for UTI She is also to take lactulose daily  for goal 2-3 BM/day  - Rifaximin discontinued on discharge as patient not taking due to cost - Continue nadolol 444mdaily (grade I varices)  Bipolar II disorder: Continue abilify, wellbutrin, prozac, neurontin  Hypertension:  Continue irbesartan-hctz, metoprolol and lasix  - F/u with PCP  2.  Labs / imaging needed at time of follow-up: None  3.  Pending labs/ test needing follow-up: None   Follow-up Appointments:  Follow-up Information    BaChesley NoonMD. Schedule an appointment as soon as possible for a visit in 1 week(s).   Specialty: Family Medicine Contact information: [id number] problem list: 1. Encephalopathy 2/2 UTI and cirrhosis  Patient presented with confusion and lethargy after an unwitnessed fall resulting in a jewelry box hitting her in the head. CT head/cervical spine/maxillofacial bones negative for acute abnormalities. Patient noted to have elevated ammonia levels in setting of known cirrhosis 2/2 NASH. 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 Patient also was not taking rifaximin due to cost. Patient treated with lactulose and rifaximin during admission. She was also noted to have pyuria on urinalysis with urine culture positive for Klebsiella species and Strep viridans for which patient started on Rocephin. Patient noted to have significant improvement in her mental status. Patient discharged home with Keflex 50057m6h for 2 days to complete 5 day course of antibiotics.  She is also discuarged with lactulose 47m60md for goal of 2-3 bowel movements per day. Patient also recommended for home health PT  2. Bipolar II disorder Patient is on multiple centrally acting medications  for her bipolar disorder which were initially held in setting of acute encephalopathy. On discharge, Continue abilify, wellbutrin, prozac, neurontin  3. Hypertension Continue irbesartan-hctz, metoprolol and lasix    Discharge Vitals:   BP 138/73   Pulse 70   Temp 97.8 F (36.6 C) (Oral)   Resp 18   Ht 5' 5"  (1.651 m)   Wt 89 kg   SpO2 98%   BMI 32.65 kg/m   Pertinent Labs, Studies, and Procedures:  CBC Latest Ref Rng & Units 02/12/2020 02/11/2020 02/10/2020  WBC 4.0 - 10.5 K/uL 6.7 6.6 6.8  Hemoglobin 12.0 - 15.0 g/dL 10.6(L) 10.6(L) 10.5(L)  Hematocrit 36 - 46 % 33.0(L) 34.0(L) 33.1(L)  Platelets 150 - 400 K/uL 167 151 149(L)   CMP Latest Ref Rng & Units 02/12/2020 02/11/2020 02/10/2020  Glucose 70 - 99 mg/dL 92 105(H) 119(H)  BUN 8 - 23 mg/dL 16 19 15   Creatinine 0.44 - 1.00 mg/dL 0.86 1.07(H) 0.89  Sodium 135 - 145 mmol/L 141  140 143  Potassium 3.5 - 5.1 mmol/L 3.9 3.4(L) 3.4(L)  Chloride 98 - 111 mmol/L 109 110 111  CO2 22 - 32 mmol/L 22 20(L) 21(L)  Calcium 8.9 - 10.3 mg/dL 9.1 9.1 9.2  Total Protein 6.5 - 8.1 g/dL 6.2(L) 6.3(L) 6.6  Total Bilirubin 0.3 - 1.2 mg/dL 0.8 0.9 1.0  Alkaline Phos 38 - 126 U/L 95 93 100  AST 15 - 41 U/L 53(H) 36 35  ALT 0 - 44 U/L 24 22 23    Urinalysis    Component Value Date/Time   COLORURINE AMBER (A) 02/09/2020 0947   APPEARANCEUR HAZY (A) 02/09/2020 0947   LABSPEC 1.011 02/09/2020 0947   PHURINE 7.0 02/09/2020 [place] 02/09/2020 0947   HGBUR NEGATIVE 02/09/2020 0947   BILIRUBINUR NEGATIVE 02/09/2020 [place] 02/09/2020 0947   PROTEINUR 100 (A) 02/09/2020 0947   NITRITE NEGATIVE 02/09/2020 0947   LEUKOCYTESUR SMALL (A) 02/09/2020 0947   HIV Screen 4th Generation wRfx Non Reactive Non Reactive    CXR 02/09/2020: IMPRESSION: No acute finding when compared with a 2020 CT.  X-RAY PELVIS 02/09/2020: FINDINGS: There is no evidence of pelvic fracture or diastasis. No pelvic bone lesions are seen.  CT  HEAD/CERVICAL SPINE/MAXILLOFACIAL WO CONTRAST 02/09/2020: IMPRESSION: CT of the head: Chronic atrophic and ischemic changes stable from the prior exam. No acute intracranial abnormality noted.  CT of the maxillofacial bones: Mild soft tissue swelling over the right supraorbital ridge consistent with the given clinical history. No acute bony abnormality is noted.  CT of the cervical spine: Multilevel degenerative change without acute abnormality.  Discharge Instructions: Discharge Instructions    Call MD for:  difficulty breathing, headache or visual disturbances   Complete by: As directed    Call MD for:  extreme fatigue   Complete by: As directed    Call MD for:  hives   Complete by: As directed    Call MD for:  persistant dizziness or light-headedness   Complete by: As directed    Call MD for:  persistant nausea and vomiting   Complete by: As directed    Call MD for:  temperature >100.4   Complete by: As directed    Diet - low sodium heart healthy   Complete by: As directed    Discharge instructions   Complete by: As directed    [name],  You were admitted to the hospital after a fall with confusion and fatigue. You were treated with lactulose and rifaxamin for elevated ammonia levels contributing to your confusion. You were also noted to have a urinary tract infection for which you were treated with antibiotics.  On discharge, please take your lactulose twice daily with a goal of 2-3 bowel movements per day.  Please take Keflex every 6 hours for 2 days to complete a five day course of antibiotics.  Please follow up with your PCP on discharge.   Thank you!   Increase activity slowly   Complete by: As directed    No wound care   Complete by: As directed       Signed: Harvie Heck, MD 02/16/2020, 1:22 PM   Pager: [phone]

## 2020-02-12 NOTE — Progress Notes (Signed)
  Date: 02/12/2020  Patient name: Sheila [name]  Medical record number: [id number]  Date of birth: Mar 03, 1946        I have seen and evaluated this patient and I have discussed the plan of care with the house staff. Please see their note for complete details. I concur with their findings with the following additions/corrections: [name] was seen this morning on team rounds.  She is at her baseline but wonders whether she does have some mild cognitive dysfunction and she was encouraged to fully treat her UTI and hepatic encephalopathy and follow-up with her primary care physician.  I agree that her acute encephalopathy was likely due to hepatic encephalopathy triggered by her UTI.  However, there is no way to definitively know whether it was due to a UTI only.  Her prior to hepatic encephalopathy admissions responded to HE treatment only and she had no infection that triggered those episodes.  Therefore, despite her normal platelets, normal LFTs, and no stigmata of chronic liver disease, I support treatment with lactulose.  Rifaximin would be beneficial, due to her hesitancy to trigger diarrhea with the lactulose, but the cost of rifaximin is prohibitive.  She is stable to be discharged to home today.  Bartholomew Crews, MD 02/12/2020, 1:20 PM

## 2020-02-12 NOTE — Progress Notes (Signed)
DISCHARGE NOTE HOME Sheila [name] to be discharged Home per MD order. Discussed prescriptions and follow up appointments with the patient. Prescriptions given to patient; medication list explained in detail. Patient verbalized understanding.  Skin clean, dry and intact without evidence of skin break down, no evidence of skin tears noted. IV catheter discontinued intact. Site without signs and symptoms of complications. Dressing and pressure applied. Pt denies pain at the site currently. No complaints noted.  Patient free of lines, drains, and wounds.   An After Visit Summary (AVS) was printed and given to the patient. Patient escorted via wheelchair, and discharged home via private auto.  Dolores Hoose, RN

## 2020-02-12 NOTE — Progress Notes (Signed)
Physical Therapy Treatment Patient Details Name: Lavon Horn MRN: [id number] DOB: 1946-05-29 Today's Date: 02/12/2020    History of Present Illness Mrs Bowditch is a 74 yo F with Hx of Cirrhosis (2/2 NASH), Memory issues, HTN, DM, Chronic pericardial effusion, Bipolar 2. And Depression who presents following a fall with confusion and lethargy. Found to have encephalopathy possibly due to concussion vs hepatic encephalopathy per chart.    PT Comments    Pt in bed upon arrival of PT, eager to participate in session with focus on progression of ambulation and determination of best AD. The pt was able to demo good progression of mobility, completing multiple sit-stand transfers and significantly increased hallway ambulation distance with minA or minG assist. However, the pt continues to present with deficits in higher-level cognition and short term memory throughout session. The pt was unable to follow multi-step commands without frequent reminders or cues, with significantly increased difficulty when distracted by communication. The pt reports she feels significantly more clear than prior days here at hospital, but agrees supervision by daughter will be most beneficial for safe d/c. Despite deficits noted below and in note by OT, I feel that a familiar environment with assist from family for a few days will be best d/c plan for this patient to facilitate successful and safe return to independence. I do recommend continued skilled PT both acutely and through [place] provided at independent living facility to further improve functional strength, endurance, and cognition.    Follow Up Recommendations  Home health PT;Other (comment) [facility] for medical management assist)     Equipment Recommendations  Rolling walker with 5" wheels;3in1 (PT)    Recommendations for Other Services       Precautions / Restrictions Precautions Precautions: Fall Precaution Comments: Fall risk greatly reduced with  RW Restrictions Weight Bearing Restrictions: No    Mobility  Bed Mobility Overal bed mobility: Modified Independent Bed Mobility: Supine to Sit     Supine to sit: Modified independent (Device/Increase time);HOB elevated     General bed mobility comments: No physical assist needed; extended time secondary to stiffness in BLEs  Transfers Overall transfer level: Needs assistance Equipment used: Rolling walker (2 wheeled) Transfers: Sit to/from [facility] Sit to Stand: Supervision Stand pivot transfers: Supervision       General transfer comment: vc for hand placment  Ambulation/Gait Ambulation/Gait assistance: Min assist;Min guard Gait Distance (Feet): 150 Feet (x3) Assistive device: Rolling walker (2 wheeled);4-wheeled walker Gait Pattern/deviations: Step-through pattern;Decreased step length - right;Decreased step length - left;Decreased stride length     General Gait Details: Cues for posture and rW proximity; pt completed 150 ft with RW, then 150 ft with rollator, then 150 ft with RW. Progressively slower gait with each bout of walking, but no LOB with either device. Pt reports she prefers RW to Dispensing optician [place] (Stroke Patients Only)       Balance Overall balance assessment: Mild deficits observed, not formally tested Sitting-balance support: No upper extremity supported;Feet supported Sitting balance-Leahy Scale: Good     Standing balance support: Bilateral upper extremity supported;During functional activity Standing balance-Leahy Scale: Fair Standing balance comment: able to static stand without UE support, can ambulate without AD but sig improved stability with RW  Cognition Arousal/Alertness: Awake/alert Behavior During Therapy: WFL for tasks assessed/performed Overall Cognitive Status: Impaired/Different from baseline Area of Impairment:  Attention;Memory;Safety/judgement                   Current Attention Level: Selective Memory: Decreased short-term memory Following Commands: Follows one step commands consistently Safety/Judgement: Decreased awareness of deficits   Problem Solving: Slow processing;Decreased initiation General Comments: Able to recall 3/3 on immediate recall; 1/3 words on delayed recall - unable to use contextual cues to recall words; forgot to use RW when walking back to bed; had just been educated to not try to carry objects adn use RW at the same time and tried to pick up a cup of water and use RW      Exercises      General Comments General comments (skin integrity, edema, etc.): Daughter present through session, reports she will be with pt through friday      Pertinent Vitals/Pain Pain Assessment: 0-10 Pain Score: 3  Faces Pain Scale: Hurts a little bit Pain Location: BLEs (calf) Pain Descriptors / Indicators: Aching;Sore;Tightness;Cramping Pain Intervention(s): Limited activity within patient's tolerance    Home Living                      Prior Function            PT Goals (current goals can now be found in the care plan section) Acute Rehab PT Goals Patient Stated Goal: Hopes to be able to go home PT Goal Formulation: With patient Time For Goal Achievement: 02/24/20 Potential to Achieve Goals: Good Progress towards PT goals: Progressing toward goals    Frequency    Min 3X/week      PT Plan Current plan remains appropriate    Co-evaluation              AM-PAC PT "6 Clicks" Mobility   Outcome Measure  Help needed turning from your back to your side while in a flat bed without using bedrails?: None Help needed moving from lying on your back to sitting on the side of a flat bed without using bedrails?: None Help needed moving to and from a bed to a chair (including a wheelchair)?: A Little Help needed standing up from a chair using your arms (e.g.,  wheelchair or bedside chair)?: A Little Help needed to walk in hospital room?: A Little Help needed climbing 3-5 steps with a railing? : A Little 6 Click Score: 20    End of Session Equipment Utilized During Treatment: Gait belt Activity Tolerance: Patient tolerated treatment well Patient left: with call bell/phone within reach;with family/visitor present;in bed (sitting EOB) Nurse Communication: Mobility status PT Visit Diagnosis: Other abnormalities of gait and mobility (R26.89);History of falling (Z91.81);Other symptoms and signs involving the nervous system (K59.977)     Time: 4142-3953 PT Time Calculation (min) (ACUTE ONLY): 54 min  Charges:  $Gait Training: 23-37 mins $Therapeutic Activity: 8-22 mins $Self Care/Home Management: 8-22                     Karma Ganja, PT, DPT   Acute Rehabilitation Department Pager #: [phone]   Otho Bellows 02/12/2020, 1:59 PM

## 2020-02-12 NOTE — TOC Progression Note (Addendum)
Transition of Care [facility]) - Progression Note    Patient Details  Name: Sheila [name] MRN: [id number] Date of Birth: 1945-11-02  [facility] CM/SW Contact  Sharlet Salina Mila Homer, LCSW Phone Number: 02/12/2020, 2:12 PM  Clinical Narrative:  Rollene Fare with [facility] contacted regarding patient's probable discharge today. CSW advised that they have been providing PT services for patient and this will continue. CSW advised that for home care needs, the family can contact "[facility]" - [phone], and this service is private pay. Per Rollene Fare PT order needed and this service will continue.  CSW visited room and conversation initiated conversation with patient, who was alone in the room and shortly afterwards, daughter [name] and spouse returned from lunch. CSW and daughter continued conversation in another location as patient was sleepy. Monica lives in [place], [place] and arrived yesterday. She was advised that therapy will continue and was provided with contact information for [facility]. Daughter feels that her mom will be fine at home and will take her medication. Nurse case manager advised daughter that order in for walker and 3-in-1 will be ordered and also brought to the room. Nurse case manager talked with daughter regarding considering contacting [facility] for medication management services.                Expected Discharge Plan and Services  [name] will return home. DME Orders placed for walker and 3-in-1 and HH order placed for [facility] with [facility].         Expected Discharge Date: 02/12/20                                     Social Determinants of Health (SDOH) Interventions  No SDOH interventions requested or needed.  Readmission Risk Interventions Readmission Risk Prevention Plan 05/15/2019  Post Dischage Appt Complete  Medication Screening Complete  Transportation Screening Complete

## 2020-02-12 NOTE — Progress Notes (Addendum)
Occupational Therapy Treatment Patient Details Name: Sheila [name] MRN: [id number] DOB: [birth year] Today's Date: 02/12/2020    History of present illness Sheila [name] is a 74 yo F with Hx of Cirrhosis (2/2 NASH), Memory issues, HTN, DM, Chronic pericardial effusion, Bipolar 2. And Depression who presents following a fall with confusion and lethargy. Found to have encephalopathy possibly due to concussion vs hepatic encephalopathy per chart.   OT comments  Pt seen for further assessment. Pt has made significant progress, however pt continues to demonstrate deficits with mobility, IADL tasks and cognition. Able to recall 1/3 words after delay recall, increased time for processing and decreased selective attention noted throughout session. Pt does not remember event/fall and is possibly post-concussive. Recommend pt have S with ADL and IADL tasks at this time. Pt will need S with medication and financial management at this time. Per wife, pt's husband has Alzheimer; Dementia therefore can not supervise  medication management. Also recommend pt refrain from driving at this time. Pt lives in [place] at [facility] and recommend pt using their healthcare assistance program until she returns to PLOF. Recommend follow up with [facility] and further cognitive assessment by HHOT to establish compensatory strategies to maximize functional level of independence. Discussed with CM and nsg.  Attempted to call daughter [name]) - no answer.  Follow Up Recommendations  Home health OT;Supervision/Assistance - 24 hour;Other (comment)    Equipment Recommendations  3 in 1 bedside commode (RW)    Recommendations for Other Services      Precautions / Restrictions Precautions Precautions: Fall Precaution Comments: Fall risk greatly reduced with RW Restrictions Weight Bearing Restrictions: No       Mobility Bed Mobility Overal bed mobility: Modified Independent Bed Mobility: Supine to Sit     Supine to  sit: Modified independent (Device/Increase time);HOB elevated     General bed mobility comments: No physical assist needed; extended time secondary to stiffness in BLEs  Transfers Overall transfer level: Needs assistance Equipment used: Rolling walker (2 wheeled) Transfers: Sit to/from [facility] Sit to Stand: Supervision Stand pivot transfers: Supervision       General transfer comment: vc for hand placment    Balance Overall balance assessment: Mild deficits observed, not formally tested                                         ADL either performed or assessed with clinical judgement   ADL Overall ADL's : Needs assistance/impaired     Grooming: Supervision/safety;Standing   Upper Body Bathing: Supervision/ safety;Standing   Lower Body Bathing: Minimal assistance;Sit to/from stand   Upper Body Dressing : Set up;Sitting   Lower Body Dressing: Minimal assistance;Sit to/from stand Lower Body Dressing Details (indicate cue type and reason): difficulty with donning socks; states husband can help Toilet Transfer: Supervision/safety;RW;Ambulation (cuing for hand placement)   Toileting- Clothing Manipulation and Hygiene: Supervision/safety;Cueing for safety       Functional mobility during ADLs: Supervision/safety;Rolling walker;Cueing for sequencing General ADL Comments: Delay in processing noted     Vision   Vision Assessment?: Yes Eye Alignment: Within Functional Limits Ocular Range of Motion: Within Functional Limits Alignment/Gaze Preference: Within Defined Limits Tracking/Visual Pursuits: Able to track stimulus in all quads without difficulty Saccades: Within functional limits Convergence: Within functional limits Visual Fields: No apparent deficits Additional Comments: wears glasses   Perception     Praxis  Cognition Arousal/Alertness: Awake/alert Behavior During Therapy: WFL for tasks assessed/performed Overall  Cognitive Status: Impaired/Different from baseline Area of Impairment: Attention;Memory;Safety/judgement                   Current Attention Level: Selective Memory: Decreased short-term memory Following Commands: Follows one step commands consistently Safety/Judgement: Decreased awareness of deficits   Problem Solving: Slow processing;Decreased initiation General Comments: Able to recall 3/3 on immediate recall; 1/3 words on delayed recall - unable to use contextual cues to recall words; forgot to use RW when walking back to bed; had just been educated to not carry objects while  using RW; after education pt tried to pick up a cup of water and use RW Standing at sink to wash hands and brush teeth; long delay and pt cued to brush teeth after wiping down counter and mouth        Exercises     Shoulder Instructions       General Comments      Pertinent Vitals/ Pain       Pain Assessment: 0-10 Pain Score: 3  Faces Pain Scale: Hurts a little bit Pain Location: BLEs (calf) Pain Descriptors / Indicators: Aching;Sore;Tightness;Cramping Pain Intervention(s): Limited activity within patient's tolerance  Home Living    Independent Living [place]                                      Prior Functioning/Environment              Frequency  Min 2X/week        Progress Toward Goals  OT Goals(current goals can now be found in the care plan section)  Progress towards OT goals: Progressing toward goals  Acute Rehab OT Goals Patient Stated Goal: Hopes to be able to go home OT Goal Formulation: With patient Time For Goal Achievement: 02/23/20 Potential to Achieve Goals: Good ADL Goals Pt Will Perform Grooming: with set-up;with supervision;sitting Pt Will Perform Upper Body Bathing: with set-up;with supervision;sitting Pt Will Perform Lower Body Bathing: with min assist Pt Will Perform Upper Body Dressing: with set-up;with supervision;sitting Pt Will  Perform Lower Body Dressing: with min assist Pt Will Transfer to Toilet: with supervision;ambulating;bedside commode Pt Will Perform Toileting - Clothing Manipulation and hygiene: with supervision;sit to/from stand Additional ADL Goal #1: Pt will be oriented x4 (using external cues prn) Additional ADL Goal #2: Pt will be S in and OOB for basic ADLs  Plan Discharge plan needs to be updated    Co-evaluation                 AM-PAC OT "6 Clicks" Daily Activity     Outcome Measure   Help from another person eating meals?: None Help from another person taking care of personal grooming?: A Little Help from another person toileting, which includes using toliet, bedpan, or urinal?: A Little Help from another person bathing (including washing, rinsing, drying)?: A Little Help from another person to put on and taking off regular upper body clothing?: A Little Help from another person to put on and taking off regular lower body clothing?: A Little 6 Click Score: 19    End of Session Equipment Utilized During Treatment: Gait belt;Rolling walker  OT Visit Diagnosis: Unsteadiness on feet (R26.81);Other symptoms and signs involving cognitive function;Pain;History of falling (Z91.81) Pain - Right/Left:  (B claves) Pain - part of body: Leg   Activity Tolerance  Patient tolerated treatment well   Patient Left in bed;with call bell/phone within reach   Nurse Communication Mobility status;Other (comment) (DC recs)        Time: 0017-4944 OT Time Calculation (min): 32 min  Charges: OT General Charges $OT Visit: 1 Visit OT Treatments $Self Care/Home Management : 23-37 mins  Maurie Boettcher, OT/L   Acute OT Clinical Specialist [place] Pager [phone] Office [phone]    [facility] 02/12/2020, 1:09 PM

## 2020-02-16 ENCOUNTER — Telehealth: Payer: Self-pay | Admitting: Gastroenterology

## 2020-02-16 NOTE — Telephone Encounter (Signed)
Fonnie Jarvis, NP from [facility] requested to speak to a provider regarding this patient.  Pt stated that she prefers to speak to a provider.  DOD 02/16/20 is Dr. Hilarie Fredrickson.

## 2020-02-16 NOTE — Telephone Encounter (Signed)
I spoke with Jerene Pitch, nurse practitioner at [facility] She saw this patient today in hospital follow-up.  She was recently admitted with hepatic encephalopathy.  Rifaximin was stopped due to side effect She was lucid today without evidence of overt encephalopathy but she had not had a bowel movement since discharge despite twice daily lactulose Brooke was asking for recommendation regarding lactulose therapy  My recommendation was that she dose titrate 30 g 2-4 times a day as needed to maintain 2-3 soft but mostly formed stools For today she can take a dose of lactulose, 30 g, every 3 hours until she has her first bowel movement Thereafter, she can take 30 g 2, 3, or 4 times a day as needed to maintain the above bowel movements Brooke felt like the patient was savvy enough to dose titrate with these instructions  I will copy Dr. Fuller Plan, her primary [facility] gastroenterologist  Office follow-up seems appropriate given recent hospitalization.  Sheri would recommend OV

## 2020-02-17 NOTE — Telephone Encounter (Signed)
Patient was previously scheduled with Dr. Fuller Plan for 03/02/20.  She will keep that appt.

## 2020-03-02 ENCOUNTER — Encounter: Payer: Self-pay | Admitting: Gastroenterology

## 2020-03-02 ENCOUNTER — Ambulatory Visit (INDEPENDENT_AMBULATORY_CARE_PROVIDER_SITE_OTHER): Payer: Medicare Other | Admitting: Gastroenterology

## 2020-03-02 ENCOUNTER — Other Ambulatory Visit (INDEPENDENT_AMBULATORY_CARE_PROVIDER_SITE_OTHER): Payer: Medicare Other

## 2020-03-02 VITALS — BP 150/60 | HR 59 | Wt 184.4 lb

## 2020-03-02 DIAGNOSIS — K7460 Unspecified cirrhosis of liver: Secondary | ICD-10-CM

## 2020-03-02 LAB — PROTIME-INR
INR: 1.1 ratio — ABNORMAL HIGH (ref 0.8–1.0)
Prothrombin Time: 12.8 s (ref 9.6–13.1)

## 2020-03-02 LAB — AMMONIA: Ammonia: 74 umol/L — ABNORMAL HIGH (ref 11–35)

## 2020-03-02 NOTE — Patient Instructions (Signed)
Your provider has requested that you go to the basement level for lab work before leaving today. Press "B" on the elevator. The lab is located at the first door on the left as you exit the elevator.  You have been scheduled for an abdominal ultrasound at [facility] (1st floor of hospital) on 03/12/20 at 9:00am. Please arrive 15 minutes prior to your appointment for registration. Make certain not to have anything to eat or drink 6 hours prior to your appointment. Should you need to reschedule your appointment, please contact radiology at [phone]. This test typically takes about 30 minutes to perform.  You will be due for a recall EGD in 06/2021. We will send you a reminder in the mail when it gets closer to that time.  Thank you for choosing me and [facility].  Pricilla Riffle. Dagoberto Ligas., MD., [facility]

## 2020-03-02 NOTE — Progress Notes (Signed)
    History of Present Illness: This is a 74 year old female returning for follow-up of decompensated NASH cirrhosis.  She is accompanied by her husband.  See June 18, 2019 office note.  She has a history of hepatic encephalopathy, nonbleeding grade 1 esophageal varices, portal hypertensive gastropathy.  She was hospitalized on June 7 for a fall, confusion and lethargy.  She was discharged on June 10.  UTI and hepatic encephalopathy were diagnosed and treated.  She notes occasional mental fogginess since discharge and no other complaints.  She states she is having about 2 bowel movements per day since discharge.  She states her PCP has recently adjusted her blood pressure medications.  CBC, CMP on 02/12/2020. Hgb stable at 10.6, AST=53, alb=2.8.  She states she received hepatitis A and B vaccines in [place] in 1986.  Current Medications, Allergies, Past Medical History, Past Surgical History, Family History and Social History were reviewed in [employer] record.   Physical Exam: General: Well developed, well nourished, no acute distress Head: Normocephalic and atraumatic Eyes:  sclerae anicteric, EOMI Ears: Normal auditory acuity Mouth: Not examined, mask on during Covid-19 pandemic Lungs: Clear throughout to auscultation Heart: Regular rate and rhythm; no murmurs, rubs or bruits Abdomen: Soft, non tender and non distended. No masses, hepatosplenomegaly or hernias noted. Normal Bowel sounds Rectal:  Not done Musculoskeletal: Symmetrical with no gross deformities  Pulses:  Normal pulses noted Extremities: No clubbing, cyanosis, edema or deformities noted Neurological: Alert oriented x 4, grossly nonfocal Psychological:  Alert and cooperative. Normal mood and affect   Assessment and Recommendations:  1.  Decompensated NASH cirrhosis with HE, grade 1 nonbleeding esophageal varices, portal hypertensive gastropathy.  Recently hospitalized with a UTI and hepatic  encephalopathy.  [place] screening with RUQ [place], AFP. Check ammonia, PT/INR, HBsAb, HAV Ab.  Continue lactulose 45 mL twice daily. Continue nadolol 40 mg daily.  Plan for surveillance EGD in 06/2021. No alcohol. No more than 2g Tylenol daily. REV in 6 months.  2 .  Iron deficiency anemia.  Continue ferrous sulfate 325 mg p.o. twice daily and follow-up with PCP.  3. GERD.  Follow antireflux measures.  Continue omeprazole 40 mg daily.

## 2020-03-03 LAB — HEPATITIS A ANTIBODY, TOTAL: Hepatitis A AB,Total: NONREACTIVE

## 2020-03-03 LAB — HEPATITIS B SURFACE ANTIBODY,QUALITATIVE: Hep B S Ab: NONREACTIVE

## 2020-03-03 LAB — AFP TUMOR MARKER: AFP-Tumor Marker: 4.2 ng/mL

## 2020-03-12 ENCOUNTER — Ambulatory Visit (HOSPITAL_COMMUNITY)
Admission: RE | Admit: 2020-03-12 | Discharge: 2020-03-12 | Disposition: A | Discharge status: Home / Self Care | Payer: Medicare Other | Source: Ambulatory Visit | Attending: Gastroenterology | Admitting: Gastroenterology

## 2020-03-12 ENCOUNTER — Other Ambulatory Visit: Payer: Self-pay

## 2020-03-12 DIAGNOSIS — K7460 Unspecified cirrhosis of liver: Secondary | ICD-10-CM | POA: Insufficient documentation

## 2020-03-17 ENCOUNTER — Ambulatory Visit (INDEPENDENT_AMBULATORY_CARE_PROVIDER_SITE_OTHER): Payer: Medicare Other | Admitting: Gastroenterology

## 2020-03-17 DIAGNOSIS — Z23 Encounter for immunization: Secondary | ICD-10-CM | POA: Diagnosis not present

## 2020-04-19 ENCOUNTER — Ambulatory Visit (INDEPENDENT_AMBULATORY_CARE_PROVIDER_SITE_OTHER): Payer: Medicare Other | Admitting: Gastroenterology

## 2020-04-19 DIAGNOSIS — Z23 Encounter for immunization: Secondary | ICD-10-CM

## 2020-04-19 DIAGNOSIS — K7460 Unspecified cirrhosis of liver: Secondary | ICD-10-CM

## 2020-08-09 ENCOUNTER — Other Ambulatory Visit: Payer: Self-pay | Admitting: Family Medicine

## 2020-08-09 DIAGNOSIS — Z1231 Encounter for screening mammogram for malignant neoplasm of breast: Secondary | ICD-10-CM

## 2020-08-25 IMAGING — DX DG CHEST 1V PORT
1 series · 1 of 1 positions shown · non-contrast
Comparison: 05/13/2019

CLINICAL DATA: Mid chest pain right jaw and 1 day. Dizziness and
nausea. Some air.

EXAM:
PORTABLE CHEST 1 VIEW

[chest ap]
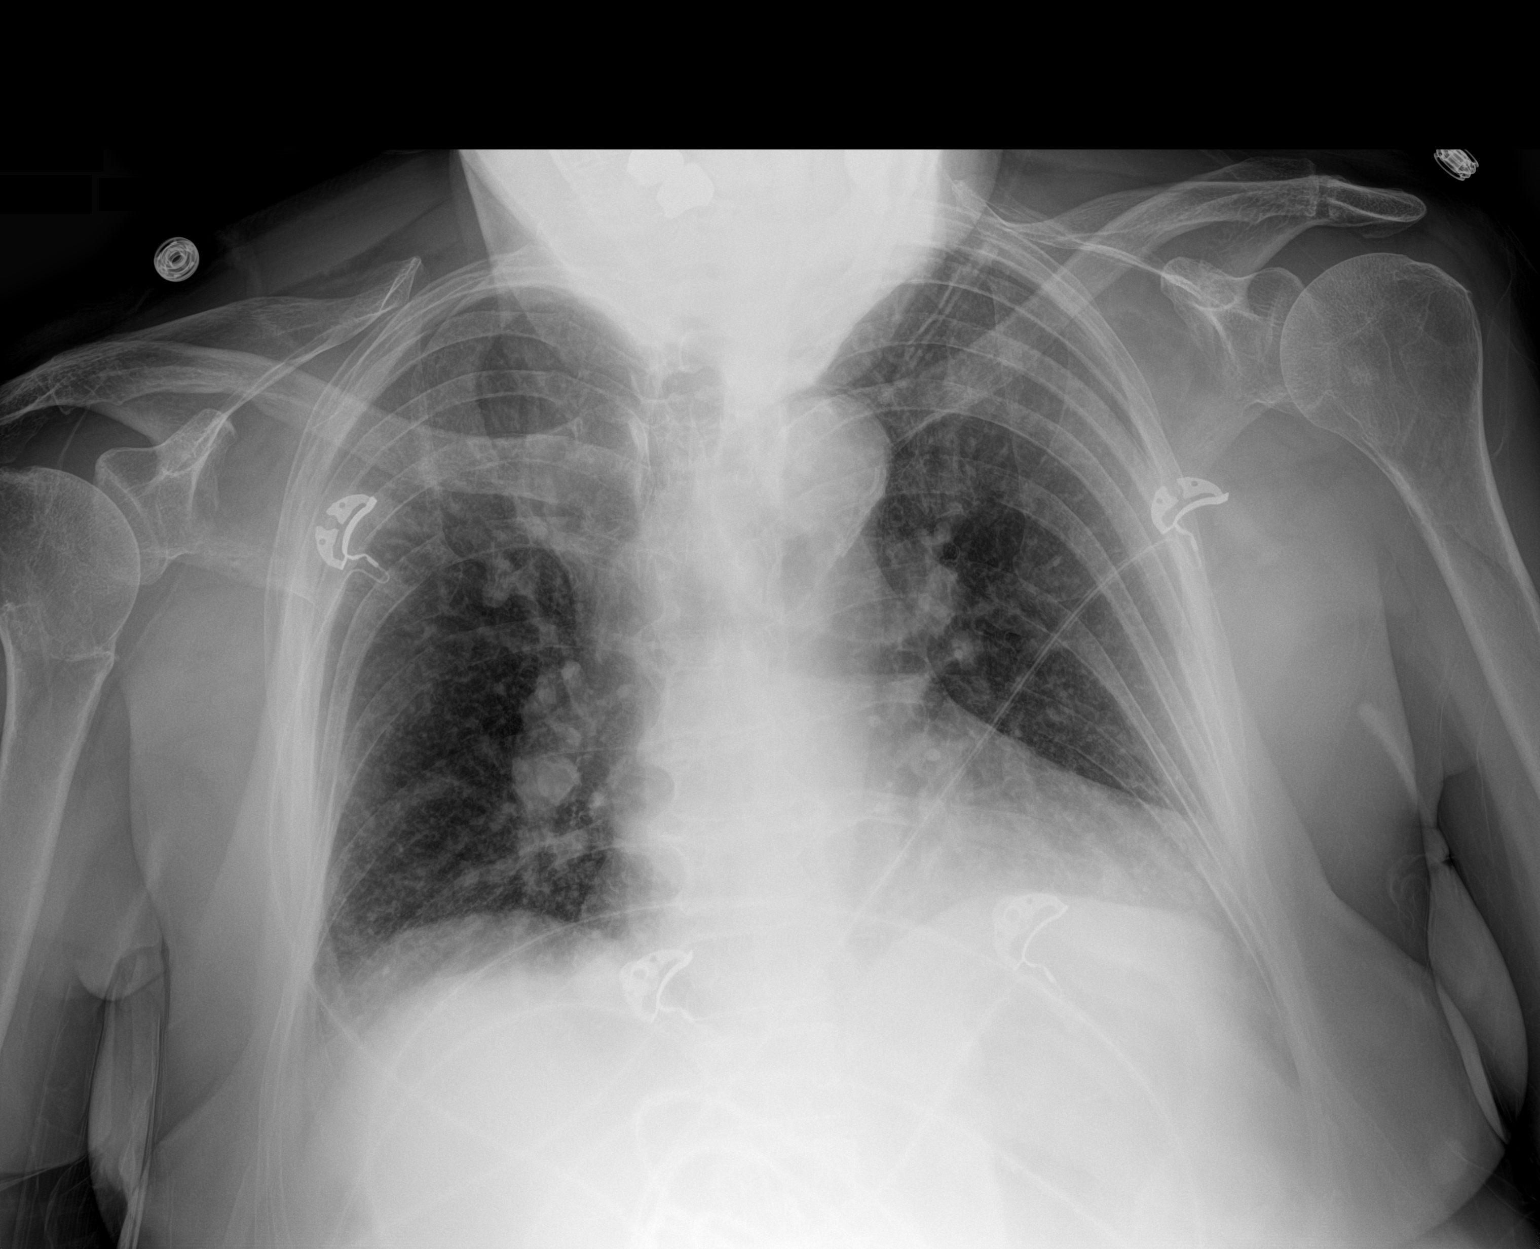

[1 of 1 positions shown; findings below may reference images not displayed]

FINDINGS: Cardiac silhouette is normal in size. No mediastinal or hilar masses
and no evidence of adenopathy. Is

Mild prominence of the bronchovascular markings bilaterally stable
from prior exam. Lungs otherwise clear. No convincing pleural
effusion or pneumothorax.

Skeletal structures are grossly intact.
IMPRESSION: No acute cardiopulmonary disease.

## 2020-08-26 IMAGING — CT CT ANGIO CHEST
2 of 6 series · 18 of 46 positions shown · IV contrast (omnipaque)
Comparison: None.

CLINICAL DATA: SOB

EXAM:
CT ANGIOGRAPHY CHEST WITH CONTRAST
TECHNIQUE: Multidetector CT imaging of the chest was performed using the
standard protocol during bolus administration of intravenous
contrast. Multiplanar CT image reconstructions and MIPs were
obtained to evaluate the vascular anatomy.
CONTRAST:  80mL OMNIPAQUE IOHEXOL 350 MG/ML SOLN

[Series 6: thins · axial · 0.79mm/px · z∈[+990,+1263]mm · 15 of 301 slices shown]
[im 14/301  lung]
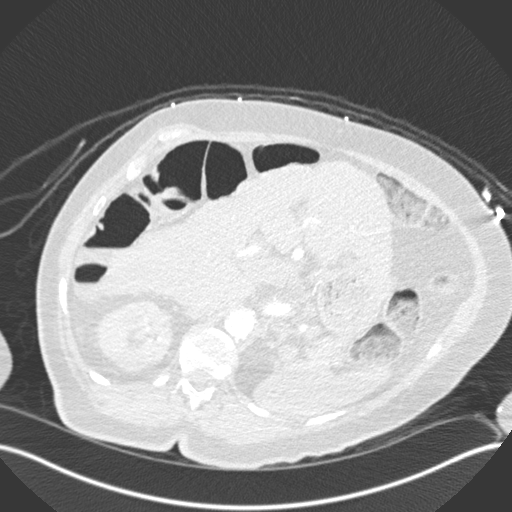
[im 40/301  soft-tissue]
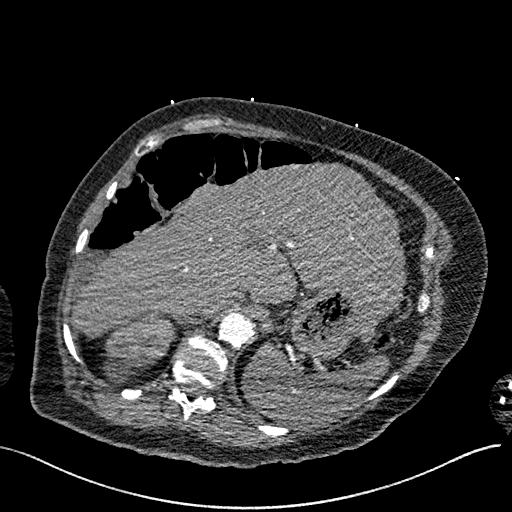
[im 53/301  lung]
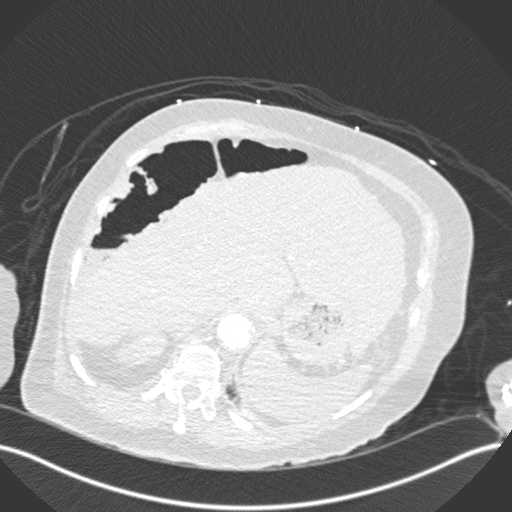
[im 79/301  soft-tissue]
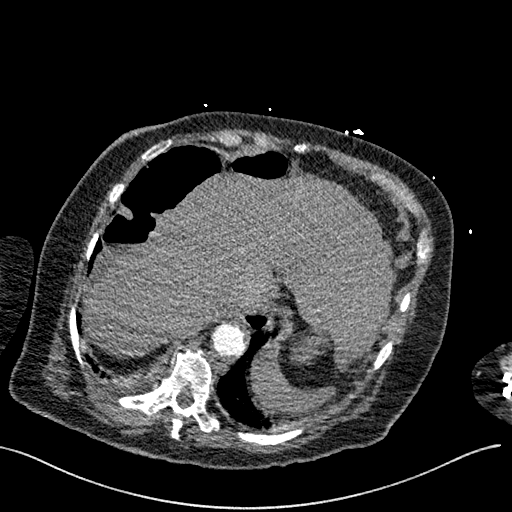
[im 92/301  lung]
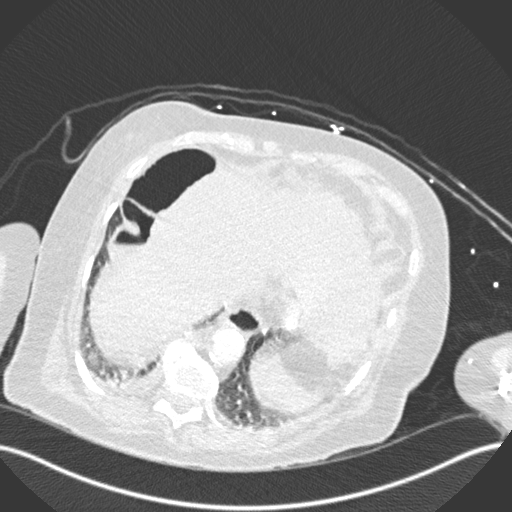
[im 118/301  soft-tissue]
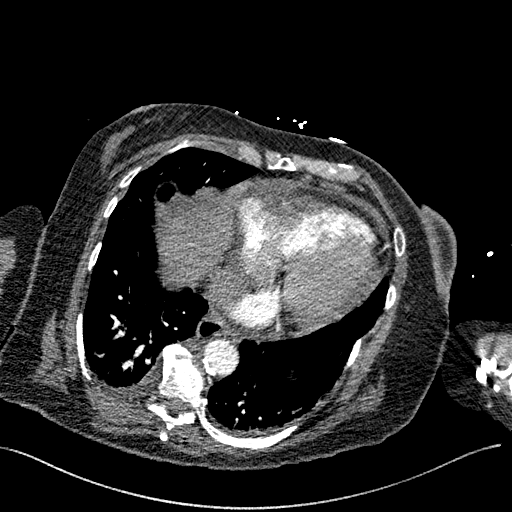
[im 131/301  lung]
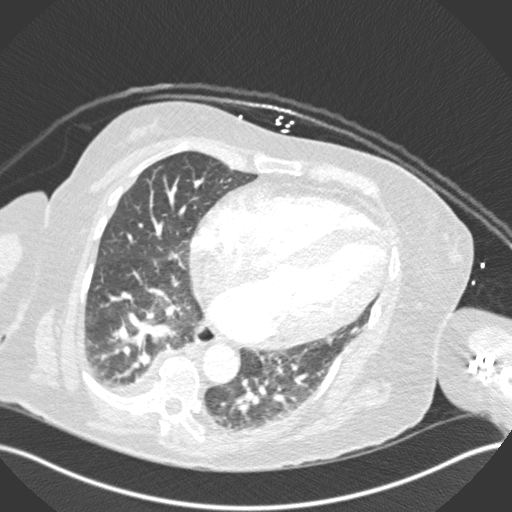
[im 157/301  soft-tissue]
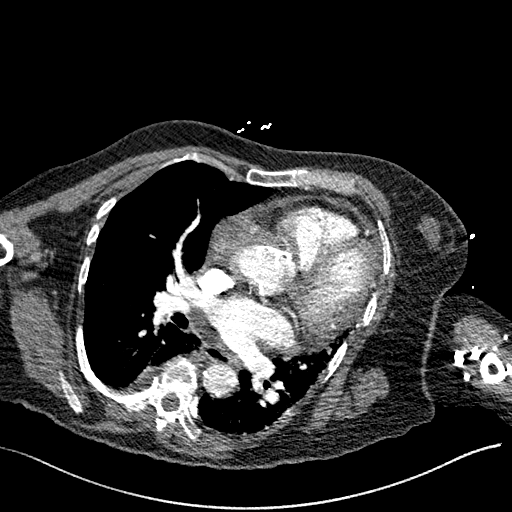
[im 170/301  lung]
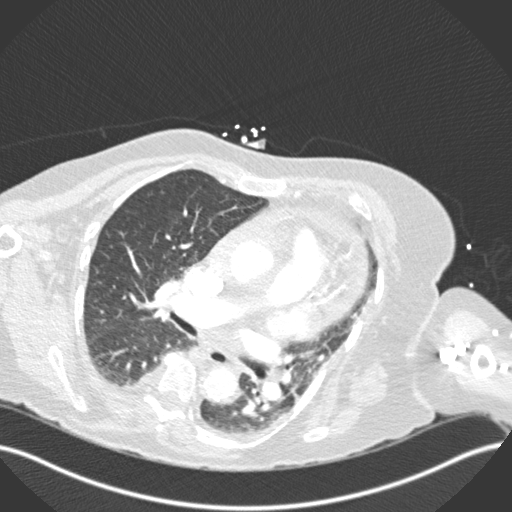
[im 183/301  soft-tissue]
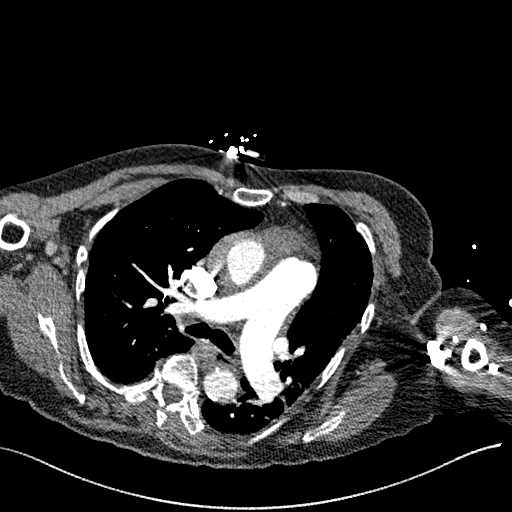
[im 209/301  lung]
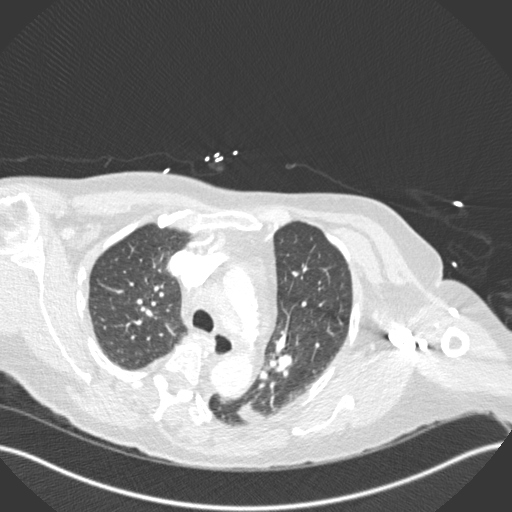
[im 222/301  soft-tissue]
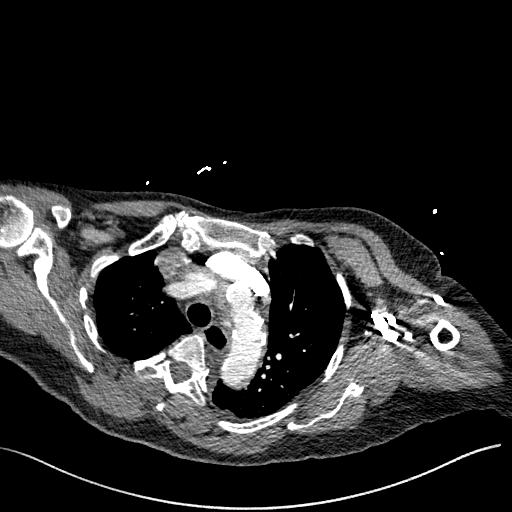
[im 248/301  lung]
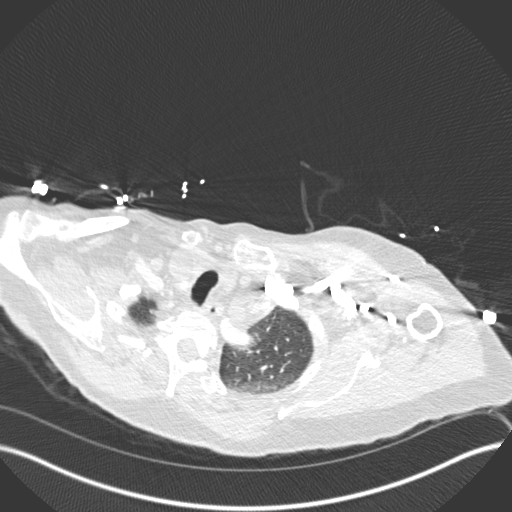
[im 261/301  soft-tissue]
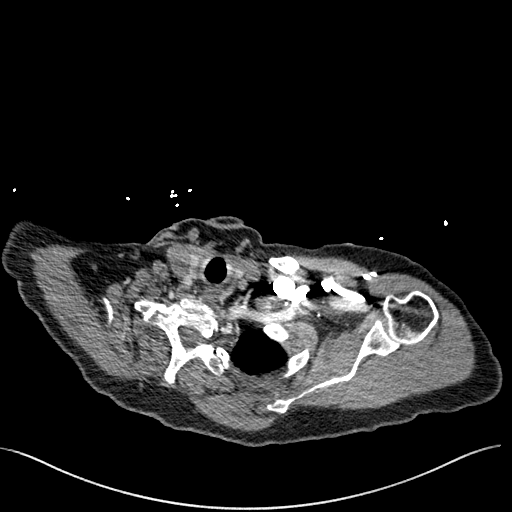
[im 287/301  lung]
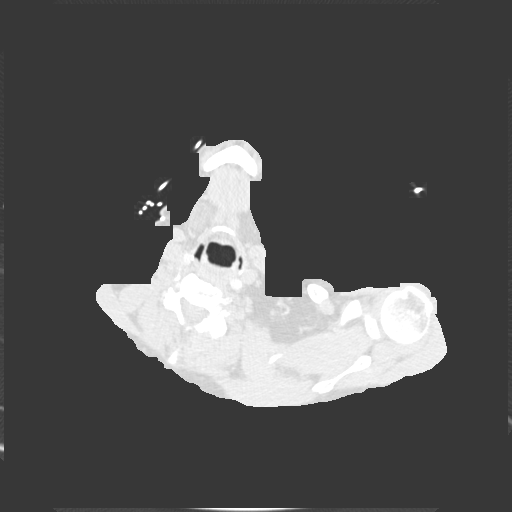

[Series 8: coronal mpr · coronal · 0.60mm/px · 3 of 151 slices shown]
[im 38/151  soft-tissue]
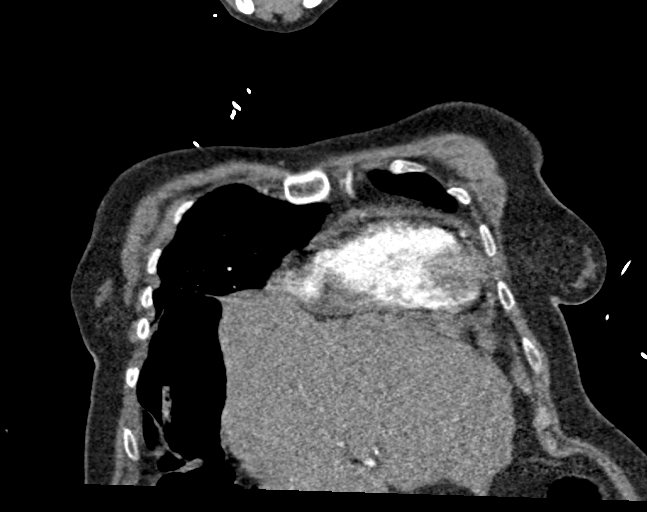
[im 76/151  soft-tissue]
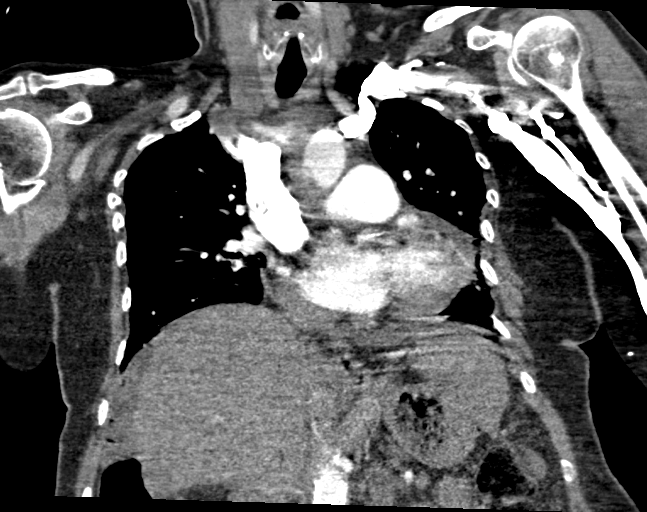
[im 113/151  soft-tissue]
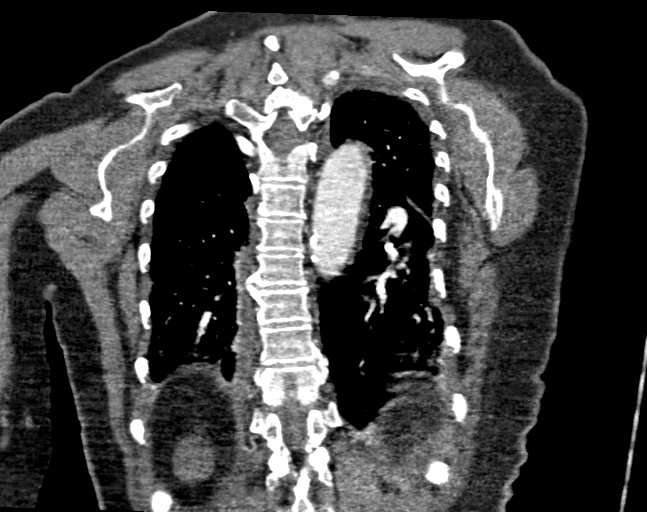

[18 of 46 positions shown; findings below may reference images not displayed]

FINDINGS: Cardiovascular: Satisfactory opacification of the pulmonary arteries
to the segmental level. No evidence of pulmonary embolism. Enlarged
heart size. Trace pericardial effusion.

Mediastinum/Nodes: No enlarged mediastinal, hilar, or axillary lymph
nodes. Thyroid gland, trachea, and esophagus demonstrate no
significant findings.

Lungs/Pleura: Lungs are clear. Trace bilateral pleural effusion. No
pneumothorax.

Upper Abdomen: No acute abnormality.

Musculoskeletal: No chest wall abnormality. No acute or significant
osseous findings.

Review of the MIP images confirms the above findings.
IMPRESSION: 1. No evidence of a pulmonary embolus.
2. Trace bilateral pleural effusion.

## 2020-09-22 ENCOUNTER — Other Ambulatory Visit: Payer: Self-pay

## 2020-09-22 ENCOUNTER — Ambulatory Visit
Admission: RE | Admit: 2020-09-22 | Discharge: 2020-09-22 | Disposition: A | Discharge status: Home / Self Care | Payer: Medicare Other | Source: Ambulatory Visit | Attending: Family Medicine | Admitting: Family Medicine

## 2020-09-22 ENCOUNTER — Telehealth: Payer: Self-pay | Admitting: Gastroenterology

## 2020-09-22 DIAGNOSIS — Z1231 Encounter for screening mammogram for malignant neoplasm of breast: Secondary | ICD-10-CM

## 2020-09-22 DIAGNOSIS — K7460 Unspecified cirrhosis of liver: Secondary | ICD-10-CM

## 2020-09-22 NOTE — Telephone Encounter (Signed)
Pt is requesting a call back from a nurse to schedule her [place].

## 2020-09-22 NOTE — Telephone Encounter (Signed)
Patient has been scheduled for 10/06/20 at [facility] .  She is to arrive at 8:45 for 9:00 and be NPO after midnight .  Patient notified

## 2020-10-06 ENCOUNTER — Ambulatory Visit (HOSPITAL_COMMUNITY)
Admission: RE | Admit: 2020-10-06 | Discharge: 2020-10-06 | Disposition: A | Discharge status: Home / Self Care | Payer: Medicare Other | Source: Ambulatory Visit | Attending: Gastroenterology | Admitting: Gastroenterology

## 2020-10-06 ENCOUNTER — Other Ambulatory Visit: Payer: Self-pay

## 2020-10-06 DIAGNOSIS — K7460 Unspecified cirrhosis of liver: Secondary | ICD-10-CM | POA: Insufficient documentation

## 2020-10-07 ENCOUNTER — Telehealth: Payer: Self-pay | Admitting: Gastroenterology

## 2020-10-07 NOTE — Telephone Encounter (Signed)
See results notes for details

## 2020-10-07 NOTE — Telephone Encounter (Signed)
Inbound call from patient returning your call in regards to her ultrasound results.

## 2020-10-11 ENCOUNTER — Other Ambulatory Visit: Payer: Self-pay

## 2020-10-11 ENCOUNTER — Ambulatory Visit: Payer: Medicare Other | Admitting: Cardiology

## 2020-10-11 ENCOUNTER — Encounter: Payer: Self-pay | Admitting: Cardiology

## 2020-10-11 VITALS — BP 167/73 | HR 59 | Temp 98.0°F | Resp 16 | Ht 65.0 in | Wt 191.0 lb

## 2020-10-11 DIAGNOSIS — F319 Bipolar disorder, unspecified: Secondary | ICD-10-CM

## 2020-10-11 DIAGNOSIS — I252 Old myocardial infarction: Secondary | ICD-10-CM

## 2020-10-11 DIAGNOSIS — I3139 Other pericardial effusion (noninflammatory): Secondary | ICD-10-CM

## 2020-10-11 DIAGNOSIS — I10 Essential (primary) hypertension: Secondary | ICD-10-CM

## 2020-10-11 DIAGNOSIS — I313 Pericardial effusion (noninflammatory): Secondary | ICD-10-CM

## 2020-10-11 DIAGNOSIS — K7460 Unspecified cirrhosis of liver: Secondary | ICD-10-CM

## 2020-10-11 DIAGNOSIS — Z87891 Personal history of nicotine dependence: Secondary | ICD-10-CM

## 2020-10-11 DIAGNOSIS — E1165 Type 2 diabetes mellitus with hyperglycemia: Secondary | ICD-10-CM

## 2020-10-11 DIAGNOSIS — E782 Mixed hyperlipidemia: Secondary | ICD-10-CM

## 2020-10-11 DIAGNOSIS — M7989 Other specified soft tissue disorders: Secondary | ICD-10-CM

## 2020-10-11 DIAGNOSIS — I493 Ventricular premature depolarization: Secondary | ICD-10-CM

## 2020-10-11 NOTE — Progress Notes (Signed)
Date:  10/11/2020   ID:  Sheila [name], DOB [birth year], MRN [id number]  PCP:  Chesley Noon, MD  Cardiologist:  Rex Kras, DO, [facility] (established care 10/11/2020) Former Cardiology Providers: Marina Goodell, MD; Dr. Meda Coffee (05/2019), Turner (05/2019).   REASON FOR CONSULT: Pericardial effusion  REQUESTING PHYSICIAN:  Chesley Noon, MD [address] [place],  [place] [address]  Chief Complaint  Patient presents with  . percardial effusion  . New Patient (Initial Visit)    HPI  Sheila [name] is a 75 y.o. female who presents to the office with a chief complaint of " history of pericardial effusion." Patient's past medical history and cardiovascular risk factors include: History of pericardial effusion, premature ventricular contractions, history of non-STEMI, hypertension, non-insulin-dependent diabetes mellitus type 2, hyperlipidemia, cirrhosis of the liver, bipolar depression, former smoker, advanced age, postmenopausal female.  She is referred to the office at the request of Chesley Noon, MD for evaluation of pericardial effusion.  Patient has been under the care of multiple cardiologists in the past for the management of her premature ventricular contractions and pericardial effusion.  Based on the most recent echocardiogram available in Care Everywhere patient is noted to have trivial pericardial effusion posteriorly as per the report.  Patient educated that trivial pericardial effusion is usually hemodynamically insignificant and most common location for pericardial effusion is posteriorly.  No prior history of tuberculosis, rheumatic fever, no recent viral illness, or autoimmune diseases.   Patient states that she is also seeing a former cardiologist for management of premature ventricular contractions.  She is currently on nadolol given her history of cirrhosis and low pressure.  Patient ventricular rate is well controlled.  An EKG does not show ventricular  ectopy.  Patient states that her blood pressures usually range between 140-160 mmHg.  Currently managed by primary team.  FUNCTIONAL STATUS: No structured exercise program or daily routine.   ALLERGIES: Allergies  Allergen Reactions  . Acetaminophen     Other reaction(s): Other Patient avoids Tylenol due to fatty liver disease  . Cefazolin Other (See Comments)    Other reaction(s): Other caused C-DIF caused C-DIF c dif caused C-DIF   . Ciprofloxacin Hcl Nausea Only  . Ibuprofen     Other reaction(s): Other Diabetic--Concerns about kidneys  . Sulfa Antibiotics Hives  . Naproxen Sodium     Causes pain in veins  . Amoxicillin Diarrhea    Did it involve swelling of the face/tongue/throat, SOB, or low BP? No Did it involve sudden or severe rash/hives, skin peeling, or any reaction on the inside of your mouth or nose? No Did you need to seek medical attention at a hospital or doctor's office? No When did it last happen?several years If all above answers are "NO", may proceed with cephalosporin use.     MEDICATION LIST PRIOR TO VISIT: Current Meds  Medication Sig  . ARIPiprazole (ABILIFY) 10 MG tablet Take 10 mg by mouth at bedtime.  Marland Kitchen buPROPion (WELLBUTRIN XL) 300 MG 24 hr tablet Take 300 mg by mouth daily.  . Calcium 500-125 MG-UNIT TABS Take 2 tablets by mouth daily.  . clobetasol cream (TEMOVATE) 2.91 % Apply 1 application topically daily as needed for rash.  . diclofenac sodium (VOLTAREN) 1 % GEL Apply 2 g topically 2 (two) times daily as needed for pain.  . diphenoxylate-atropine (LOMOTIL) 2.5-0.025 MG tablet Take 2 tablets by mouth 4 (four) times daily as needed.  . ezetimibe (ZETIA) 10 MG tablet Take 10 mg by  mouth every morning.  Marland Kitchen FLUoxetine (PROZAC) 40 MG capsule Take 40 mg by mouth daily.  Marland Kitchen lactulose (CHRONULAC) 10 GM/15ML solution Take 45 mLs (30 g total) by mouth 2 (two) times daily.  . metoprolol tartrate (LOPRESSOR) 25 MG tablet Take 25 mg by mouth 2  (two) times daily.  . mirabegron ER (MYRBETRIQ) 25 MG TB24 tablet Take by mouth.  . nadolol (CORGARD) 40 MG tablet Take 1 tablet (40 mg total) by mouth daily.  Marland Kitchen omeprazole (PRILOSEC) 40 MG capsule Take 40 mg by mouth daily.  Vladimir Faster Glycol-Propyl Glycol (SYSTANE) 0.4-0.3 % SOLN Place 1 drop into both eyes 2 (two) times daily.  . SitaGLIPtin-MetFORMIN HCl (JANUMET XR) [phone] MG TB24 Take 1 tablet by mouth every evening.  Marland Kitchen telmisartan-hydrochlorothiazide (MICARDIS HCT) 80-25 MG tablet Take 1 tablet by mouth daily.  . TRUE METRIX BLOOD GLUCOSE TEST test strip TEST BS BID     PAST MEDICAL HISTORY: Past Medical History:  Diagnosis Date  . Anemia   . Anxiety   . Cataract   . Depression   . Diabetes mellitus without complication ([place])   . Diverticulitis    Bled  . Esophageal varices ([place])   . Gallstones   . GERD (gastroesophageal reflux disease)   . Heart murmur   . Hx of migraines   . Hyperlipidemia   . Hypertension   . IC (interstitial cystitis)   . Irritable bowel syndrome   . NASH (nonalcoholic steatohepatitis)   . Obesity   . Pneumonia   . PVC (premature ventricular contraction)   . Vulva cancer ([place])     PAST SURGICAL HISTORY: Past Surgical History:  Procedure Laterality Date  . ABDOMINAL HYSTERECTOMY     total  . CHOLECYSTECTOMY    . COLONOSCOPY    . FOOT SURGERY Bilateral    Bunion  . HERNIA REPAIR      FAMILY HISTORY: The patient family history is not on file. She was adopted.  SOCIAL HISTORY:  The patient  reports that she quit smoking about 13 years ago. Her smoking use included cigarettes. She has a 25.00 pack-year smoking history. She has never used smokeless tobacco. She reports that she does not drink alcohol and does not use drugs.  REVIEW OF SYSTEMS: Review of Systems  Constitutional: Negative for chills and fever.  HENT: Negative for hoarse voice and nosebleeds.   Eyes: Negative for discharge, double vision and pain.  Cardiovascular:  Positive for leg swelling. Negative for chest pain, claudication, dyspnea on exertion, near-syncope, orthopnea, palpitations, paroxysmal nocturnal dyspnea and syncope.  Respiratory: Negative for hemoptysis and shortness of breath.   Musculoskeletal: Negative for muscle cramps and myalgias.  Gastrointestinal: Negative for abdominal pain, constipation, diarrhea, hematemesis, hematochezia, melena, nausea and vomiting.  Neurological: Negative for dizziness and light-headedness.    PHYSICAL EXAM: Vitals with BMI 10/11/2020 03/02/2020 02/12/2020  Height 5' 5"  - -  Weight 191 lbs 184 lbs 6 oz -  BMI 62.83 15.17 -  Systolic 616 073 710  Diastolic 73 60 73  Pulse 59 59 70    CONSTITUTIONAL: Well-developed and well-nourished. No acute distress.  SKIN: Skin is warm and dry. No rash noted. No cyanosis. No pallor. No jaundice HEAD: Normocephalic and atraumatic.  EYES: No scleral icterus MOUTH/THROAT: Moist oral membranes.  NECK: No JVD present. No thyromegaly noted. No carotid bruits  LYMPHATIC: No visible cervical adenopathy.  CHEST Normal respiratory effort. No intercostal retractions  LUNGS: Clear to auscultation bilaterally. No stridor. No wheezes. No rales.  CARDIOVASCULAR: Regular, bradycardia, positive E5-I7, soft holosystolic murmur at apex, no rubs or gallops appreciated. ABDOMINAL: Obese, soft, nontender, nondistended, positive bowel sounds all 4 quadrants. No apparent ascites.  EXTREMITIES: +2 LE swelling up to knee. Warm to touch. Difficult to  HEMATOLOGIC: No significant bruising NEUROLOGIC: Oriented to person, place, and time. Nonfocal. Normal muscle tone.  PSYCHIATRIC: Normal mood and affect. Normal behavior. Cooperative  CARDIAC DATABASE: EKG: 10/11/2020: Sinus bradycardia, 59 bpm, consider old inferior infarct without underlying injury pattern.  This  Echocardiogram: 02/03/2020: Left Ventricle: Left ventricle is normal in size. There is borderline hypertrophy. Systolic function is  normal. EF: 60-65%. No regional wall motion abnormalities noted. Doppler parameters indicate normal diastolic   function.  . Left Atrium: Left atrium is mildly dilated.  . Aortic Valve: The aortic valve is tricuspid. The leaflets exhibit probably normal excursion. There is moderate sclerosis. Mild regurgitation with centrally directed jet.  . Tricuspid Valve: Tricuspid valve structure is normal. There is mild regurgitation. The right ventricular systolic pressure is normal (<36 mmHg).  . Pericardium: There is a trivial pericardial effusion noted posterior to the heart.   Stress Testing: No results found for this or any previous visit from the past 1095 days.  Heart Catheterization: Greater than 10 years ago in [place].    LABORATORY DATA: CBC Latest Ref Rng & Units 02/12/2020 02/11/2020 02/10/2020  WBC 4.0 - 10.5 K/uL 6.7 6.6 6.8  Hemoglobin 12.0 - 15.0 g/dL 10.6(L) 10.6(L) 10.5(L)  Hematocrit 36.0 - 46.0 % 33.0(L) 34.0(L) 33.1(L)  Platelets 150 - 400 K/uL 167 151 149(L)    CMP Latest Ref Rng & Units 02/12/2020 02/11/2020 02/10/2020  Glucose 70 - 99 mg/dL 92 105(H) 119(H)  BUN 8 - 23 mg/dL 16 19 15   Creatinine 0.44 - 1.00 mg/dL 0.86 1.07(H) 0.89  Sodium 135 - 145 mmol/L 141 140 143  Potassium 3.5 - 5.1 mmol/L 3.9 3.4(L) 3.4(L)  Chloride 98 - 111 mmol/L 109 110 111  CO2 22 - 32 mmol/L 22 20(L) 21(L)  Calcium 8.9 - 10.3 mg/dL 9.1 9.1 9.2  Total Protein 6.5 - 8.1 g/dL 6.2(L) 6.3(L) 6.6  Total Bilirubin 0.3 - 1.2 mg/dL 0.8 0.9 1.0  Alkaline Phos 38 - 126 U/L 95 93 100  AST 15 - 41 U/L 53(H) 36 35  ALT 0 - 44 U/L 24 22 23     Lipid Panel     Component Value Date/Time   CHOL 111 05/24/2019 0538   TRIG 40 05/24/2019 0538   HDL 37 (L) 05/24/2019 0538   CHOLHDL 3.0 05/24/2019 0538   VLDL 8 05/24/2019 0538   LDLCALC 66 05/24/2019 0538    No components found for: NTPROBNP No results for input(s): PROBNP in the last 8760 hours. No results for input(s): TSH in the last 8760  hours.  BMP Recent Labs    02/10/20 0336 02/11/20 0447 02/12/20 0454  NA 143 140 141  K 3.4* 3.4* 3.9  CL 111 110 109  CO2 21* 20* 22  GLUCOSE 119* 105* 92  BUN 15 19 16   CREATININE 0.89 1.07* 0.86  CALCIUM 9.2 9.1 9.1  GFRNONAA >60 51* >60  GFRAA >60 59* >60    HEMOGLOBIN A1C Lab Results  Component Value Date   HGBA1C 6.5 (H) 02/09/2020   MPG 139.85 02/09/2020   External Labs: Collected: 09/27/2020 at [place]. Creatinine 0.88 mg/dL. eGFR: >60 mL/min per 1.73 m AST 29, ALT 17 Lipid profile: Total cholesterol 142, triglycerides 86, HDL 43, LDL 82, non-HDL 99  Hemoglobin A1c: 5.3  IMPRESSION:    ICD-10-CM   1. Pericardial effusion  I31.3 ECHOCARDIOGRAM COMPLETE  2. PVCs (premature ventricular contractions)  I49.3   3. Leg swelling  M79.89 VAS [place] LOWER EXTREMITY VENOUS (DVT)    CANCELED: PCV LOWER VENOUS US (BILATERAL)  4. Hx of non-ST elevation myocardial infarction (NSTEMI)  I25.2   5. Essential hypertension  I10 EKG 12-Lead  6. Type 2 diabetes mellitus with hyperglycemia, without long-term current use of insulin (HCC)  E11.65   7. Mixed hyperlipidemia  E78.2   8. Cirrhosis of liver without ascites, unspecified hepatic cirrhosis type ([place])  K74.60   9. Bipolar depression ([place])  F31.9   10. Former smoker  Z87.891      RECOMMENDATIONS: Belenda Alviar is a 75 y.o. female whose past medical history and cardiac risk factors include: History of pericardial effusion, premature ventricular contractions, history of non-STEMI, hypertension, non-insulin-dependent diabetes mellitus type 2, hyperlipidemia, cirrhosis of the liver, bipolar depression, former smoker, advanced age, postmenopausal female.  Pericardial effusion:  Patient is referred to the office for further evaluation of pericardial effusion.  The most recent echocardiogram that I could find in Care Everywhere notes that she has had a trivial pericardial effusion in the past predominantly located posteriorly.   If the patient's pericardial effusion remains trivial no additional work-up would be advised.  We will check an echo to evaluate for structural heart disease and her history of pericardial effusion.  Patient denies any history of tuberculosis, rheumatic fever, connective tissue disorder, medications reviewed.  Premature ventricular contractions:  Patient states that she has a history of PVCs and was started on Lopressor.  EKG at today's office visit notes overall normal sinus rhythm without ventricular ectopy.  Given her underlying cirrhosis of the liver she is also on nadolol.  We will hold off on additional testing at this time as she is asymptomatic.   Lower extremity swelling, etiology unknown:  Patient has at least 2+ bilateral lower extremity swelling which is most likely accounting for her 10 pound weight gain since December 2021.  Patient is currently on Lasix and is prescribed by her PCP.  Patient is hesitant to uptitrate diuretic therapy given her underlying cirrhosis of the liver.  Patient would like [place] to reach out to her provider Dr. Fuller Plan prior to uptitrating diuretic therapy.  If agreeable, would recommended transitioning to Torsemide 29m po qday and labs in one week to check kidney and electrolytes.   Echocardiogram will be ordered to evaluate for structural heart disease and left ventricular systolic function.  Plan lower extremity duplex to rule out DVT  Benign essential hypertension:  Office blood pressure is currently not well controlled.  She is attributing it to consuming a high salt diet. She is requesting information on low salt diet.   Home SBP ranges between 140-1622mg, patient is asked to follow up w/ PCP.   Educated on importance of a low-salt diet.  Currently managed by primary care provider.  NIDDM Type II: Currently on oral medications. Educated on the importance of glycemic control. Most recent A1c reviewed.   Educated her on the importance  of improving her modifiable cardiovascular risk factors.   FINAL MEDICATION LIST END OF ENCOUNTER: No orders of the defined types were placed in this encounter.   Medications Discontinued During This Encounter  Medication Reason  . Apoaequorin (PREVAGEN EXTRA STRENGTH) 20 MG CAPS Error  . ferrous sulfate 325 (65 FE) MG EC tablet Error  . gabapentin (NEURONTIN) 100 MG  capsule Error     Current Outpatient Medications:  .  ARIPiprazole (ABILIFY) 10 MG tablet, Take 10 mg by mouth at bedtime., Disp: , Rfl:  .  buPROPion (WELLBUTRIN XL) 300 MG 24 hr tablet, Take 300 mg by mouth daily., Disp: , Rfl:  .  Calcium 500-125 MG-UNIT TABS, Take 2 tablets by mouth daily., Disp: , Rfl:  .  clobetasol cream (TEMOVATE) 6.57 %, Apply 1 application topically daily as needed for rash., Disp: , Rfl:  .  diclofenac sodium (VOLTAREN) 1 % GEL, Apply 2 g topically 2 (two) times daily as needed for pain., Disp: , Rfl:  .  diphenoxylate-atropine (LOMOTIL) 2.5-0.025 MG tablet, Take 2 tablets by mouth 4 (four) times daily as needed., Disp: , Rfl:  .  ezetimibe (ZETIA) 10 MG tablet, Take 10 mg by mouth every morning., Disp: , Rfl:  .  FLUoxetine (PROZAC) 40 MG capsule, Take 40 mg by mouth daily., Disp: , Rfl:  .  lactulose (CHRONULAC) 10 GM/15ML solution, Take 45 mLs (30 g total) by mouth 2 (two) times daily., Disp: 236 mL, Rfl: 0 .  metoprolol tartrate (LOPRESSOR) 25 MG tablet, Take 25 mg by mouth 2 (two) times daily., Disp: , Rfl:  .  mirabegron ER (MYRBETRIQ) 25 MG TB24 tablet, Take by mouth., Disp: , Rfl:  .  nadolol (CORGARD) 40 MG tablet, Take 1 tablet (40 mg total) by mouth daily., Disp: 30 tablet, Rfl: 11 .  omeprazole (PRILOSEC) 40 MG capsule, Take 40 mg by mouth daily., Disp: , Rfl:  .  Polyethyl Glycol-Propyl Glycol (SYSTANE) 0.4-0.3 % SOLN, Place 1 drop into both eyes 2 (two) times daily., Disp: , Rfl:  .  SitaGLIPtin-MetFORMIN HCl (JANUMET XR) [phone] MG TB24, Take 1 tablet by mouth every evening., Disp: ,  Rfl:  .  telmisartan-hydrochlorothiazide (MICARDIS HCT) 80-25 MG tablet, Take 1 tablet by mouth daily., Disp: , Rfl:  .  TRUE METRIX BLOOD GLUCOSE TEST test strip, TEST BS BID, Disp: , Rfl:  .  furosemide (LASIX) 20 MG tablet, Take 20 mg by mouth daily., Disp: , Rfl:   Orders Placed This Encounter  Procedures  . EKG 12-Lead  . ECHOCARDIOGRAM COMPLETE  . VAS [place] LOWER EXTREMITY VENOUS (DVT)    Patient Instructions   Low-Sodium Eating Plan Sodium, which is an element that makes up salt, helps you maintain a healthy balance of fluids in your body. Too much sodium can increase your blood pressure and cause fluid and waste to be held in your body. Your health care provider or dietitian may recommend following this plan if you have high blood pressure (hypertension), kidney disease, liver disease, or heart failure. Eating less sodium can help lower your blood pressure, reduce swelling, and protect your heart, liver, and kidneys. What are tips for following this plan? Reading food labels  The Nutrition Facts label lists the amount of sodium in one serving of the food. If you eat more than one serving, you must multiply the listed amount of sodium by the number of servings.  Choose foods with less than 140 mg of sodium per serving.  Avoid foods with 300 mg of sodium or more per serving. Shopping  Look for lower-sodium products, often labeled as "low-sodium" or "no salt added."  Always check the sodium content, even if foods are labeled as "unsalted" or "no salt added."  Buy fresh foods. ? Avoid canned foods and pre-made or frozen meals. ? Avoid canned, cured, or processed meats.  Buy breads that have  less than 80 mg of sodium per slice.   Cooking  Eat more home-cooked food and less restaurant, buffet, and fast food.  Avoid adding salt when cooking. Use salt-free seasonings or herbs instead of table salt or sea salt. Check with your health care provider or pharmacist before using salt  substitutes.  Cook with plant-based oils, such as canola, sunflower, or olive oil.   Meal planning  When eating at a restaurant, ask that your food be prepared with less salt or no salt, if possible. Avoid dishes labeled as brined, pickled, cured, smoked, or made with soy sauce, miso, or teriyaki sauce.  Avoid foods that contain MSG (monosodium glutamate). MSG is sometimes added to [place] food, bouillon, and some canned foods.  Make meals that can be grilled, baked, poached, roasted, or steamed. These are generally made with less sodium. General information Most people on this plan should limit their sodium intake to 1,500-2,000 mg (milligrams) of sodium each day. What foods should I eat? Fruits Fresh, frozen, or canned fruit. Fruit juice. Vegetables Fresh or frozen vegetables. "No salt added" canned vegetables. "No salt added" tomato sauce and paste. Low-sodium or reduced-sodium tomato and vegetable juice. Grains Low-sodium cereals, including oats, puffed wheat and rice, and shredded wheat. Low-sodium crackers. Unsalted rice. Unsalted pasta. Low-sodium bread. Whole-grain breads and whole-grain pasta. Meats and other proteins Fresh or frozen (no salt added) meat, poultry, seafood, and fish. Low-sodium canned tuna and salmon. Unsalted nuts. Dried peas, beans, and lentils without added salt. Unsalted canned beans. Eggs. Unsalted nut butters. Dairy Milk. Soy milk. Cheese that is naturally low in sodium, such as ricotta cheese, fresh mozzarella, or Swiss cheese. Low-sodium or reduced-sodium cheese. Cream cheese. Yogurt. Seasonings and condiments Fresh and dried herbs and spices. Salt-free seasonings. Low-sodium mustard and ketchup. Sodium-free salad dressing. Sodium-free light mayonnaise. Fresh or refrigerated horseradish. Lemon juice. Vinegar. Other foods Homemade, reduced-sodium, or low-sodium soups. Unsalted popcorn and pretzels. Low-salt or salt-free chips. The items listed above may not  be a complete list of foods and beverages you can eat. Contact a dietitian for more information. What foods should I avoid? Vegetables Sauerkraut, pickled vegetables, and relishes. Olives. [place] fries. Onion rings. Regular canned vegetables (not low-sodium or reduced-sodium). Regular canned tomato sauce and paste (not low-sodium or reduced-sodium). Regular tomato and vegetable juice (not low-sodium or reduced-sodium). Frozen vegetables in sauces. Grains Instant hot cereals. Bread stuffing, pancake, and biscuit mixes. Croutons. Seasoned rice or pasta mixes. Noodle soup cups. Boxed or frozen macaroni and cheese. Regular salted crackers. Self-rising flour. Meats and other proteins Meat or fish that is salted, canned, smoked, spiced, or pickled. Precooked or cured meat, such as sausages or meat loaves. Bernie[place] Salines. Ham. Pepperoni. Hot dogs. Corned beef. Chipped beef. Salt pork. Jerky. Pickled herring. Anchovies and sardines. Regular canned tuna. Salted nuts. Dairy Processed cheese and cheese spreads. Hard cheeses. Cheese curds. Blue cheese. Feta cheese. String cheese. Regular cottage cheese. Buttermilk. Canned milk. Fats and oils Salted butter. Regular margarine. Ghee. Bacon fat. Seasonings and condiments Onion salt, garlic salt, seasoned salt, table salt, and sea salt. Canned and packaged gravies. Worcestershire sauce. Tartar sauce. Barbecue sauce. Teriyaki sauce. Soy sauce, including reduced-sodium. Steak sauce. Fish sauce. Oyster sauce. Cocktail sauce. Horseradish that you find on the shelf. Regular ketchup and mustard. Meat flavorings and tenderizers. Bouillon cubes. Hot sauce. Pre-made or packaged marinades. Pre-made or packaged taco seasonings. Relishes. Regular salad dressings. Salsa. Other foods Salted popcorn and pretzels. Corn chips and puffs. Potato and tortilla chips. Canned  or dried soups. Pizza. Frozen entrees and pot pies. The items listed above may not be a complete list of foods and beverages  you should avoid. Contact a dietitian for more information. Summary  Eating less sodium can help lower your blood pressure, reduce swelling, and protect your heart, liver, and kidneys.  Most people on this plan should limit their sodium intake to 1,500-2,000 mg (milligrams) of sodium each day.  Canned, boxed, and frozen foods are high in sodium. Restaurant foods, fast foods, and pizza are also very high in sodium. You also get sodium by adding salt to food.  Try to cook at home, eat more fresh fruits and vegetables, and eat less fast food and canned, processed, or prepared foods. This information is not intended to replace advice given to you by your health care provider. Make sure you discuss any questions you have with your health care provider. Document Revised: 09/26/2019 Document Reviewed: 07/23/2019 Elsevier Patient Education  2021 [place] cardiac medications as reconciled in final medication list. --Return in about 4 weeks (around 11/08/2020) for Follow up, Review test results, . Or sooner if needed. --Continue follow-up with your primary care physician regarding the management of your other chronic comorbid conditions.  Patient's questions and concerns were addressed to her satisfaction. She voices understanding of the instructions provided during this encounter.   This note was created using a voice recognition software as a result there may be grammatical errors inadvertently enclosed that do not reflect the nature of this encounter. Every attempt is made to correct such errors.  Rex Kras, [place], [facility]  Pager: [phone] Office: [phone]

## 2020-10-11 NOTE — Patient Instructions (Signed)
Low-Sodium Eating Plan Sodium, which is an element that makes up salt, helps you maintain a healthy balance of fluids in your body. Too much sodium can increase your blood pressure and cause fluid and waste to be held in your body. Your health care provider or dietitian may recommend following this plan if you have high blood pressure (hypertension), kidney disease, liver disease, or heart failure. Eating less sodium can help lower your blood pressure, reduce swelling, and protect your heart, liver, and kidneys. What are tips for following this plan? Reading food labels  The Nutrition Facts label lists the amount of sodium in one serving of the food. If you eat more than one serving, you must multiply the listed amount of sodium by the number of servings.  Choose foods with less than 140 mg of sodium per serving.  Avoid foods with 300 mg of sodium or more per serving. Shopping  Look for lower-sodium products, often labeled as "low-sodium" or "no salt added."  Always check the sodium content, even if foods are labeled as "unsalted" or "no salt added."  Buy fresh foods. ? Avoid canned foods and pre-made or frozen meals. ? Avoid canned, cured, or processed meats.  Buy breads that have less than 80 mg of sodium per slice.   Cooking  Eat more home-cooked food and less restaurant, buffet, and fast food.  Avoid adding salt when cooking. Use salt-free seasonings or herbs instead of table salt or sea salt. Check with your health care provider or pharmacist before using salt substitutes.  Cook with plant-based oils, such as canola, sunflower, or olive oil.   Meal planning  When eating at a restaurant, ask that your food be prepared with less salt or no salt, if possible. Avoid dishes labeled as brined, pickled, cured, smoked, or made with soy sauce, miso, or teriyaki sauce.  Avoid foods that contain MSG (monosodium glutamate). MSG is sometimes added to [place] food, bouillon, and some canned  foods.  Make meals that can be grilled, baked, poached, roasted, or steamed. These are generally made with less sodium. General information Most people on this plan should limit their sodium intake to 1,500-2,000 mg (milligrams) of sodium each day. What foods should I eat? Fruits Fresh, frozen, or canned fruit. Fruit juice. Vegetables Fresh or frozen vegetables. "No salt added" canned vegetables. "No salt added" tomato sauce and paste. Low-sodium or reduced-sodium tomato and vegetable juice. Grains Low-sodium cereals, including oats, puffed wheat and rice, and shredded wheat. Low-sodium crackers. Unsalted rice. Unsalted pasta. Low-sodium bread. Whole-grain breads and whole-grain pasta. Meats and other proteins Fresh or frozen (no salt added) meat, poultry, seafood, and fish. Low-sodium canned tuna and salmon. Unsalted nuts. Dried peas, beans, and lentils without added salt. Unsalted canned beans. Eggs. Unsalted nut butters. Dairy Milk. Soy milk. Cheese that is naturally low in sodium, such as ricotta cheese, fresh mozzarella, or Swiss cheese. Low-sodium or reduced-sodium cheese. Cream cheese. Yogurt. Seasonings and condiments Fresh and dried herbs and spices. Salt-free seasonings. Low-sodium mustard and ketchup. Sodium-free salad dressing. Sodium-free light mayonnaise. Fresh or refrigerated horseradish. Lemon juice. Vinegar. Other foods Homemade, reduced-sodium, or low-sodium soups. Unsalted popcorn and pretzels. Low-salt or salt-free chips. The items listed above may not be a complete list of foods and beverages you can eat. Contact a dietitian for more information. What foods should I avoid? Vegetables Sauerkraut, pickled vegetables, and relishes. Olives. [place] fries. Onion rings. Regular canned vegetables (not low-sodium or reduced-sodium). Regular canned tomato sauce and paste (not low-sodium  or reduced-sodium). Regular tomato and vegetable juice (not low-sodium or reduced-sodium). Frozen  vegetables in sauces. Grains Instant hot cereals. Bread stuffing, pancake, and biscuit mixes. Croutons. Seasoned rice or pasta mixes. Noodle soup cups. Boxed or frozen macaroni and cheese. Regular salted crackers. Self-rising flour. Meats and other proteins Meat or fish that is salted, canned, smoked, spiced, or pickled. Precooked or cured meat, such as sausages or meat loaves. Berniece Salines. Ham. Pepperoni. Hot dogs. Corned beef. Chipped beef. Salt pork. Jerky. Pickled herring. Anchovies and sardines. Regular canned tuna. Salted nuts. Dairy Processed cheese and cheese spreads. Hard cheeses. Cheese curds. Blue cheese. Feta cheese. String cheese. Regular cottage cheese. Buttermilk. Canned milk. Fats and oils Salted butter. Regular margarine. Ghee. Bacon fat. Seasonings and condiments Onion salt, garlic salt, seasoned salt, table salt, and sea salt. Canned and packaged gravies. Worcestershire sauce. Tartar sauce. Barbecue sauce. Teriyaki sauce. Soy sauce, including reduced-sodium. Steak sauce. Fish sauce. Oyster sauce. Cocktail sauce. Horseradish that you find on the shelf. Regular ketchup and mustard. Meat flavorings and tenderizers. Bouillon cubes. Hot sauce. Pre-made or packaged marinades. Pre-made or packaged taco seasonings. Relishes. Regular salad dressings. Salsa. Other foods Salted popcorn and pretzels. Corn chips and puffs. Potato and tortilla chips. Canned or dried soups. Pizza. Frozen entrees and pot pies. The items listed above may not be a complete list of foods and beverages you should avoid. Contact a dietitian for more information. Summary  Eating less sodium can help lower your blood pressure, reduce swelling, and protect your heart, liver, and kidneys.  Most people on this plan should limit their sodium intake to 1,500-2,000 mg (milligrams) of sodium each day.  Canned, boxed, and frozen foods are high in sodium. Restaurant foods, fast foods, and pizza are also very high in sodium. You  also get sodium by adding salt to food.  Try to cook at home, eat more fresh fruits and vegetables, and eat less fast food and canned, processed, or prepared foods. This information is not intended to replace advice given to you by your health care provider. Make sure you discuss any questions you have with your health care provider. Document Revised: 09/26/2019 Document Reviewed: 07/23/2019 Elsevier Patient Education  2021 [employer].

## 2020-10-19 ENCOUNTER — Ambulatory Visit (HOSPITAL_BASED_OUTPATIENT_CLINIC_OR_DEPARTMENT_OTHER)
Admission: RE | Admit: 2020-10-19 | Discharge: 2020-10-19 | Disposition: A | Discharge status: Home / Self Care | Payer: Medicare Other | Source: Ambulatory Visit | Attending: Cardiology | Admitting: Cardiology

## 2020-10-19 ENCOUNTER — Other Ambulatory Visit: Payer: Self-pay

## 2020-10-19 DIAGNOSIS — I313 Pericardial effusion (noninflammatory): Secondary | ICD-10-CM | POA: Insufficient documentation

## 2020-10-19 DIAGNOSIS — M7989 Other specified soft tissue disorders: Secondary | ICD-10-CM | POA: Insufficient documentation

## 2020-10-19 DIAGNOSIS — I351 Nonrheumatic aortic (valve) insufficiency: Secondary | ICD-10-CM | POA: Insufficient documentation

## 2020-10-19 DIAGNOSIS — K7290 Hepatic failure, unspecified without coma: Secondary | ICD-10-CM | POA: Diagnosis not present

## 2020-10-19 DIAGNOSIS — I3139 Other pericardial effusion (noninflammatory): Secondary | ICD-10-CM

## 2020-10-19 NOTE — Progress Notes (Signed)
  Echocardiogram 2D Echocardiogram has been performed.  Randa Lynn Asja Frommer 10/19/2020, 3:13 PM

## 2020-10-20 ENCOUNTER — Ambulatory Visit (INDEPENDENT_AMBULATORY_CARE_PROVIDER_SITE_OTHER): Payer: Medicare Other | Admitting: Gastroenterology

## 2020-10-20 ENCOUNTER — Other Ambulatory Visit (INDEPENDENT_AMBULATORY_CARE_PROVIDER_SITE_OTHER): Payer: Medicare Other

## 2020-10-20 DIAGNOSIS — Z23 Encounter for immunization: Secondary | ICD-10-CM

## 2020-10-20 DIAGNOSIS — K7460 Unspecified cirrhosis of liver: Secondary | ICD-10-CM

## 2020-10-20 LAB — CBC WITH DIFFERENTIAL/PLATELET
Basophils Absolute: 0.1 10*3/uL (ref 0.0–0.1)
Basophils Relative: 1.9 % (ref 0.0–3.0)
Eosinophils Absolute: 0.1 10*3/uL (ref 0.0–0.7)
Eosinophils Relative: 2.2 % (ref 0.0–5.0)
HCT: 29.6 % — ABNORMAL LOW (ref 36.0–46.0)
Hemoglobin: 9.8 g/dL — ABNORMAL LOW (ref 12.0–15.0)
Lymphocytes Relative: 13.1 % (ref 12.0–46.0)
Lymphs Abs: 0.5 10*3/uL — ABNORMAL LOW (ref 0.7–4.0)
MCHC: 33 g/dL (ref 30.0–36.0)
MCV: 82.7 fl (ref 78.0–100.0)
Monocytes Absolute: 0.7 10*3/uL (ref 0.1–1.0)
Monocytes Relative: 17.7 % — ABNORMAL HIGH (ref 3.0–12.0)
Neutro Abs: 2.7 10*3/uL (ref 1.4–7.7)
Neutrophils Relative %: 65.1 % (ref 43.0–77.0)
Platelets: 192 10*3/uL (ref 150.0–400.0)
RBC: 3.57 Mil/uL — ABNORMAL LOW (ref 3.87–5.11)
RDW: 18.9 % — ABNORMAL HIGH (ref 11.5–15.5)
WBC: 4.1 10*3/uL (ref 4.0–10.5)

## 2020-10-20 LAB — COMPREHENSIVE METABOLIC PANEL
ALT: 16 U/L (ref 0–35)
AST: 26 U/L (ref 0–37)
Albumin: 3.2 g/dL — ABNORMAL LOW (ref 3.5–5.2)
Alkaline Phosphatase: 122 U/L — ABNORMAL HIGH (ref 39–117)
BUN: 17 mg/dL (ref 6–23)
CO2: 26 mEq/L (ref 19–32)
Calcium: 9.3 mg/dL (ref 8.4–10.5)
Chloride: 101 mEq/L (ref 96–112)
Creatinine, Ser: 1.05 mg/dL (ref 0.40–1.20)
GFR: 51.66 mL/min — ABNORMAL LOW (ref >60.00)
Glucose, Bld: 149 mg/dL — ABNORMAL HIGH (ref 70–99)
Potassium: 3.2 mEq/L — ABNORMAL LOW (ref 3.5–5.1)
Sodium: 136 mEq/L (ref 135–145)
Total Bilirubin: 1 mg/dL (ref 0.2–1.2)
Total Protein: 7.2 g/dL (ref 6.0–8.3)

## 2020-10-20 LAB — ECHOCARDIOGRAM COMPLETE
Area-P 1/2: 3.12 cm2
P 1/2 time: 657 msec
S' Lateral: 3.5 cm

## 2020-10-20 LAB — PROTIME-INR
INR: 1.1 ratio — ABNORMAL HIGH (ref 0.8–1.0)
Prothrombin Time: 12.8 s (ref 9.6–13.1)

## 2020-10-21 ENCOUNTER — Other Ambulatory Visit: Payer: Self-pay

## 2020-10-21 DIAGNOSIS — K7460 Unspecified cirrhosis of liver: Secondary | ICD-10-CM

## 2020-10-21 LAB — AFP TUMOR MARKER: AFP-Tumor Marker: 4.2 ng/mL

## 2020-10-22 ENCOUNTER — Emergency Department (HOSPITAL_COMMUNITY): Payer: Medicare Other

## 2020-10-22 ENCOUNTER — Inpatient Hospital Stay (HOSPITAL_COMMUNITY)
Admission: EM | Admit: 2020-10-22 | Discharge: 2020-11-02 | DRG: 441 | Disposition: A | Discharge status: Skilled Nursing Facility | Payer: Medicare Other | Source: Skilled Nursing Facility | Attending: Student in an Organized Health Care Education/Training Program | Admitting: Student in an Organized Health Care Education/Training Program

## 2020-10-22 ENCOUNTER — Encounter (HOSPITAL_COMMUNITY): Payer: Self-pay | Admitting: Emergency Medicine

## 2020-10-22 ENCOUNTER — Other Ambulatory Visit: Payer: Self-pay

## 2020-10-22 DIAGNOSIS — E785 Hyperlipidemia, unspecified: Secondary | ICD-10-CM | POA: Diagnosis not present

## 2020-10-22 DIAGNOSIS — I5032 Chronic diastolic (congestive) heart failure: Secondary | ICD-10-CM | POA: Diagnosis present

## 2020-10-22 DIAGNOSIS — K7290 Hepatic failure, unspecified without coma: Principal | ICD-10-CM | POA: Diagnosis present

## 2020-10-22 DIAGNOSIS — E119 Type 2 diabetes mellitus without complications: Secondary | ICD-10-CM | POA: Diagnosis not present

## 2020-10-22 DIAGNOSIS — Z79899 Other long term (current) drug therapy: Secondary | ICD-10-CM | POA: Diagnosis not present

## 2020-10-22 DIAGNOSIS — Z7984 Long term (current) use of oral hypoglycemic drugs: Secondary | ICD-10-CM | POA: Diagnosis not present

## 2020-10-22 DIAGNOSIS — F3189 Other bipolar disorder: Secondary | ICD-10-CM | POA: Diagnosis not present

## 2020-10-22 DIAGNOSIS — U071 COVID-19: Secondary | ICD-10-CM | POA: Diagnosis present

## 2020-10-22 DIAGNOSIS — I5030 Unspecified diastolic (congestive) heart failure: Secondary | ICD-10-CM | POA: Diagnosis not present

## 2020-10-22 DIAGNOSIS — D519 Vitamin B12 deficiency anemia, unspecified: Secondary | ICD-10-CM | POA: Diagnosis not present

## 2020-10-22 DIAGNOSIS — N1831 Chronic kidney disease, stage 3a: Secondary | ICD-10-CM | POA: Diagnosis present

## 2020-10-22 DIAGNOSIS — N3281 Overactive bladder: Secondary | ICD-10-CM | POA: Diagnosis present

## 2020-10-22 DIAGNOSIS — E876 Hypokalemia: Secondary | ICD-10-CM | POA: Diagnosis present

## 2020-10-22 DIAGNOSIS — E1122 Type 2 diabetes mellitus with diabetic chronic kidney disease: Secondary | ICD-10-CM | POA: Diagnosis present

## 2020-10-22 DIAGNOSIS — Z9049 Acquired absence of other specified parts of digestive tract: Secondary | ICD-10-CM

## 2020-10-22 DIAGNOSIS — I10 Essential (primary) hypertension: Secondary | ICD-10-CM | POA: Diagnosis not present

## 2020-10-22 DIAGNOSIS — K7682 Hepatic encephalopathy: Secondary | ICD-10-CM | POA: Diagnosis present

## 2020-10-22 DIAGNOSIS — D509 Iron deficiency anemia, unspecified: Secondary | ICD-10-CM | POA: Diagnosis present

## 2020-10-22 DIAGNOSIS — Z8544 Personal history of malignant neoplasm of other female genital organs: Secondary | ICD-10-CM | POA: Diagnosis not present

## 2020-10-22 DIAGNOSIS — R0602 Shortness of breath: Secondary | ICD-10-CM

## 2020-10-22 DIAGNOSIS — F319 Bipolar disorder, unspecified: Secondary | ICD-10-CM | POA: Diagnosis present

## 2020-10-22 DIAGNOSIS — K219 Gastro-esophageal reflux disease without esophagitis: Secondary | ICD-10-CM | POA: Diagnosis present

## 2020-10-22 DIAGNOSIS — R001 Bradycardia, unspecified: Secondary | ICD-10-CM | POA: Diagnosis not present

## 2020-10-22 DIAGNOSIS — K7460 Unspecified cirrhosis of liver: Secondary | ICD-10-CM | POA: Diagnosis present

## 2020-10-22 DIAGNOSIS — E11649 Type 2 diabetes mellitus with hypoglycemia without coma: Secondary | ICD-10-CM | POA: Diagnosis present

## 2020-10-22 DIAGNOSIS — K7581 Nonalcoholic steatohepatitis (NASH): Secondary | ICD-10-CM | POA: Diagnosis not present

## 2020-10-22 DIAGNOSIS — E538 Deficiency of other specified B group vitamins: Secondary | ICD-10-CM | POA: Diagnosis present

## 2020-10-22 DIAGNOSIS — K5909 Other constipation: Secondary | ICD-10-CM | POA: Diagnosis present

## 2020-10-22 DIAGNOSIS — I2510 Atherosclerotic heart disease of native coronary artery without angina pectoris: Secondary | ICD-10-CM | POA: Diagnosis present

## 2020-10-22 DIAGNOSIS — I110 Hypertensive heart disease with heart failure: Secondary | ICD-10-CM | POA: Diagnosis not present

## 2020-10-22 DIAGNOSIS — G9340 Encephalopathy, unspecified: Secondary | ICD-10-CM | POA: Diagnosis present

## 2020-10-22 DIAGNOSIS — K7469 Other cirrhosis of liver: Secondary | ICD-10-CM | POA: Diagnosis not present

## 2020-10-22 DIAGNOSIS — I130 Hypertensive heart and chronic kidney disease with heart failure and stage 1 through stage 4 chronic kidney disease, or unspecified chronic kidney disease: Secondary | ICD-10-CM | POA: Diagnosis present

## 2020-10-22 DIAGNOSIS — Z87891 Personal history of nicotine dependence: Secondary | ICD-10-CM

## 2020-10-22 DIAGNOSIS — Z90710 Acquired absence of both cervix and uterus: Secondary | ICD-10-CM

## 2020-10-22 DIAGNOSIS — I252 Old myocardial infarction: Secondary | ICD-10-CM

## 2020-10-22 DIAGNOSIS — R600 Localized edema: Secondary | ICD-10-CM | POA: Diagnosis not present

## 2020-10-22 DIAGNOSIS — R339 Retention of urine, unspecified: Secondary | ICD-10-CM | POA: Diagnosis not present

## 2020-10-22 DIAGNOSIS — R338 Other retention of urine: Secondary | ICD-10-CM | POA: Diagnosis present

## 2020-10-22 DIAGNOSIS — K7200 Acute and subacute hepatic failure without coma: Secondary | ICD-10-CM | POA: Diagnosis present

## 2020-10-22 DIAGNOSIS — I509 Heart failure, unspecified: Secondary | ICD-10-CM | POA: Diagnosis not present

## 2020-10-22 LAB — CBC
HCT: 29.9 % — ABNORMAL LOW (ref 36.0–46.0)
Hemoglobin: 9.3 g/dL — ABNORMAL LOW (ref 12.0–15.0)
MCH: 26.6 pg (ref 26.0–34.0)
MCHC: 31.1 g/dL (ref 30.0–36.0)
MCV: 85.7 fL (ref 80.0–100.0)
Platelets: 188 10*3/uL (ref 150–400)
RBC: 3.49 MIL/uL — ABNORMAL LOW (ref 3.87–5.11)
RDW: 18.9 % — ABNORMAL HIGH (ref 11.5–15.5)
WBC: 4.5 10*3/uL (ref 4.0–10.5)
nRBC: 0 % (ref 0.0–0.2)

## 2020-10-22 LAB — LACTIC ACID, PLASMA: Lactic Acid, Venous: 1.7 mmol/L (ref 0.5–1.9)

## 2020-10-22 LAB — COMPREHENSIVE METABOLIC PANEL
ALT: 20 U/L (ref 0–44)
AST: 33 U/L (ref 15–41)
Albumin: 2.9 g/dL — ABNORMAL LOW (ref 3.5–5.0)
Alkaline Phosphatase: 112 U/L (ref 38–126)
Anion gap: 11 (ref 5–15)
BUN: 17 mg/dL (ref 8–23)
CO2: 23 mmol/L (ref 22–32)
Calcium: 9.3 mg/dL (ref 8.9–10.3)
Chloride: 102 mmol/L (ref 98–111)
Creatinine, Ser: 1.17 mg/dL — ABNORMAL HIGH (ref 0.44–1.00)
GFR, Estimated: 48 mL/min — ABNORMAL LOW (ref >60)
Glucose, Bld: 90 mg/dL (ref 70–99)
Potassium: 3 mmol/L — ABNORMAL LOW (ref 3.5–5.1)
Sodium: 136 mmol/L (ref 135–145)
Total Bilirubin: 0.9 mg/dL (ref 0.3–1.2)
Total Protein: 6.8 g/dL (ref 6.5–8.1)

## 2020-10-22 LAB — RESP PANEL BY RT-PCR (FLU A&B, COVID) ARPGX2
Influenza A by PCR: NEGATIVE
Influenza B by PCR: NEGATIVE
SARS Coronavirus 2 by RT PCR: POSITIVE — AB

## 2020-10-22 LAB — RAPID URINE DRUG SCREEN, HOSP PERFORMED
Amphetamines: NOT DETECTED
Barbiturates: NOT DETECTED
Benzodiazepines: NOT DETECTED
Cocaine: NOT DETECTED
Opiates: NOT DETECTED
Tetrahydrocannabinol: NOT DETECTED

## 2020-10-22 LAB — URINALYSIS, MICROSCOPIC (REFLEX)

## 2020-10-22 LAB — URINALYSIS, ROUTINE W REFLEX MICROSCOPIC
Bilirubin Urine: NEGATIVE
Glucose, UA: 100 mg/dL — AB
Hgb urine dipstick: NEGATIVE
Ketones, ur: NEGATIVE mg/dL
Nitrite: NEGATIVE
Protein, ur: NEGATIVE mg/dL
Specific Gravity, Urine: 1.01 (ref 1.005–1.030)
pH: 7.5 (ref 5.0–8.0)

## 2020-10-22 LAB — AMMONIA: Ammonia: 80 umol/L — ABNORMAL HIGH (ref 9–35)

## 2020-10-22 LAB — PROTIME-INR
INR: 1.2 (ref 0.8–1.2)
Prothrombin Time: 14.6 seconds (ref 11.4–15.2)

## 2020-10-22 LAB — ACETAMINOPHEN LEVEL: Acetaminophen (Tylenol), Serum: <10 ug/mL — ABNORMAL LOW (ref 10–30)

## 2020-10-22 LAB — CBG MONITORING, ED: Glucose-Capillary: 89 mg/dL (ref 70–99)

## 2020-10-22 LAB — POC SARS CORONAVIRUS 2 AG -  ED: SARS Coronavirus 2 Ag: NEGATIVE

## 2020-10-22 LAB — LIPASE, BLOOD: Lipase: 70 U/L — ABNORMAL HIGH (ref 11–51)

## 2020-10-22 LAB — ETHANOL: Alcohol, Ethyl (B): <10 mg/dL (ref <10)

## 2020-10-22 LAB — SALICYLATE LEVEL: Salicylate Lvl: <7 mg/dL — ABNORMAL LOW (ref 7.0–30.0)

## 2020-10-22 MED ORDER — GUAIFENESIN ER 600 MG PO TB12
600.0000 mg | ORAL_TABLET | Freq: Two times a day (BID) | ORAL | Status: DC
  Administered 2020-10-23 – 2020-10-27 (×10): 600 mg via ORAL
  Filled 2020-10-22 (×11): qty 1

## 2020-10-22 MED ORDER — LACTULOSE 10 GM/15ML PO SOLN
30.0000 g | Freq: Two times a day (BID) | ORAL | Status: DC
  Administered 2020-10-23 (×3): 30 g via ORAL
  Filled 2020-10-22 (×4): qty 45

## 2020-10-22 MED ORDER — HYDROCHLOROTHIAZIDE 25 MG PO TABS
25.0000 mg | ORAL_TABLET | Freq: Every day | ORAL | Status: DC
  Administered 2020-10-23: 25 mg via ORAL
  Filled 2020-10-22: qty 1

## 2020-10-22 MED ORDER — INSULIN ASPART 100 UNIT/ML ~~LOC~~ SOLN
0.0000 [IU] | Freq: Three times a day (TID) | SUBCUTANEOUS | Status: DC
  Administered 2020-10-23 – 2020-10-27 (×5): 2 [IU] via SUBCUTANEOUS

## 2020-10-22 MED ORDER — LACTULOSE 10 GM/15ML PO SOLN
30.0000 g | Freq: Once | ORAL | Status: AC
  Administered 2020-10-22: 30 g via ORAL
  Filled 2020-10-22: qty 45

## 2020-10-22 MED ORDER — INSULIN GLARGINE 100 UNIT/ML ~~LOC~~ SOLN
14.0000 [IU] | Freq: Every day | SUBCUTANEOUS | Status: DC
  Filled 2020-10-22 (×2): qty 0.14

## 2020-10-22 MED ORDER — ENOXAPARIN SODIUM 40 MG/0.4ML ~~LOC~~ SOLN
40.0000 mg | SUBCUTANEOUS | Status: DC
  Administered 2020-10-23 – 2020-11-02 (×11): 40 mg via SUBCUTANEOUS
  Filled 2020-10-22 (×11): qty 0.4

## 2020-10-22 MED ORDER — POLYETHYLENE GLYCOL 3350 17 G PO PACK
17.0000 g | PACK | Freq: Every day | ORAL | Status: DC | PRN
  Administered 2020-11-01: 17 g via ORAL
  Filled 2020-10-22: qty 1

## 2020-10-22 MED ORDER — POTASSIUM CHLORIDE CRYS ER 20 MEQ PO TBCR
40.0000 meq | EXTENDED_RELEASE_TABLET | Freq: Two times a day (BID) | ORAL | Status: AC
  Administered 2020-10-23 (×2): 40 meq via ORAL
  Filled 2020-10-22 (×2): qty 2

## 2020-10-22 MED ORDER — PANTOPRAZOLE SODIUM 40 MG PO TBEC
40.0000 mg | DELAYED_RELEASE_TABLET | Freq: Every day | ORAL | Status: DC
  Administered 2020-10-23 – 2020-11-02 (×11): 40 mg via ORAL
  Filled 2020-10-22 (×11): qty 1

## 2020-10-22 MED ORDER — TELMISARTAN-HCTZ 80-25 MG PO TABS: 1.0000 | ORAL_TABLET | Freq: Every day | ORAL | Status: DC

## 2020-10-22 MED ORDER — FUROSEMIDE 20 MG PO TABS
20.0000 mg | ORAL_TABLET | Freq: Every day | ORAL | Status: DC
  Administered 2020-10-23 – 2020-10-24 (×2): 20 mg via ORAL
  Filled 2020-10-22 (×2): qty 1

## 2020-10-22 MED ORDER — DICLOFENAC SODIUM 1 % TD GEL
2.0000 g | Freq: Two times a day (BID) | TRANSDERMAL | Status: DC | PRN
  Administered 2020-10-28 – 2020-11-02 (×6): 2 g via TOPICAL
  Filled 2020-10-22 (×2): qty 100

## 2020-10-22 MED ORDER — IRBESARTAN 300 MG PO TABS
300.0000 mg | ORAL_TABLET | Freq: Every day | ORAL | Status: DC
  Administered 2020-10-23: 300 mg via ORAL
  Filled 2020-10-22: qty 1

## 2020-10-22 NOTE — H&P (Signed)
Date: 10/22/2020               Patient Name:  Sheila [name] MRN: [id number]  DOB: 1946/02/22 Age / Sex: 75 y.o., female   PCP: Chesley Noon, MD         Medical Service: Internal Medicine Teaching Service         Attending Physician: Dr. Joni Reining    First Contact: Dr. Gaylan Gerold Pager: [phone]  Second Contact: Dr. Harvie Heck Pager: [phone]       After Hours (After 5p/  First Contact Pager: [phone]  weekends / holidays): Second Contact Pager: [phone]   Chief Complaint: altered mental status  History of Present Illness:   [name] is a 75 year old woman with past medical history of NASH cirrhosis with hepatic encephalopathy, NSTEMI, premature ventricular contractions, hypertension, type 2 diabetes mellitus, CKD stage 3a, overactive bladder, bipolar disorder, vitamin B12 deficiency who presents to the [facility] ED via EMS from [place] with a 1-day history of confusion.  Patient states that she is currently confused and will "try [her] best" to relay her full story. Reports she was in her usual state of health until this afternoon when she became concerned that she was not able to work her cell phone. She notified her PCP because she was worried that her ammonia level was elevated 2/2 to her NASH, as this has happened in the past, and was instructed to go to the ED. Reports she has been out of her furosemide and lactulose for at least a week but otherwise has been given all of her medication from the nurses at [facility]. In addition to feeling confused, notes that she developed a cough and rhinorrhea the day prior to admission. States her husband was diagnosed with COVID two days prior to admission. Vaccinated x 2 and boosted. States she feels somewhat dizzy chronically and feels "a headache coming on." Also endorses dysuria in addition to her chronic overactive bladder, states she thinks she has a UTI. Denies diarrhea, abdominal pain, chest pain, shortness of breath,  numbness or tingling, weakness. Uses a walker at home due to balance issues for the last 2 years.   ED Course: Afebrile (Tmax 36.8), RR 12-18, P 60s, BP 160s-200/70s-80s, SpO2 >95% on room air. CBC with WBC 4.5, H&H 9.3/29.9 with MCV 85.7 (baseline Hgb 10-11), platelets 188k. PT/INR 14.6/1.2. Lipase 70. Lactic acid 1.7. Ammonia 80. POC SARS Ag negative, however COVID PCR positive. Urinalysis with clear yellow urine, SG 1.010, glucose 100, small leukocytes, 11-20 WBC, negative rare bacteria otherwise unremarkable. Urine culture pending. Salicylate level <4.2, acetaminophen <10, ethanol <10, CBG 89. CMP with Na 136, K 3.0, Bicarb 23, glucose 90, BUN/Cr 17/1.17 with eGFR 48 (baseline sCr ~1), calcium 9.3, albumin 2.9, AST/ALT/ALP 33/20/112. CXR with cardiomegaly with findings of vascular congestion and trace small bilateral pleural effusions, relatively widened upper mediastinum not substantially changed from prior study. CT head without acute intracranial abnormalities but with atrophy and chornic small vessel ischemic changes of the white matter.   Medications:  According to chart review (from Care Everywhere/last PCP visit on 09/27/20):  - ALPRAZOLAM (XANAX) 0.5 MG TABLET Take one tablet (0.5 mg dose) by mouth 3 (three) times a day as needed for Sleep or Anxiety.  - APOAEQUORIN (PREVAGEN EXTRA STRENGTH) 20 MG CAPS Take 1 capsule by mouth daily. Prevagen  - ARIPIPRAZOLE (ABILIFY) 10 MG TABLET Take 10 mg by mouth at bedtime.  - BUPROPION HCL (WELLBUTRIN XL) 300 MG 24  HR TABLET TAKE 1 TABLET(300 MG) BY MOUTH DAILY  - CALCIUM-VITAMIN D-VITAMIN K (VIACTIV PO) Take 1 tablet by mouth every morning.  - CLOBETASOL PROPIONATE (TEMOVATE) 0.05% CREAM APPLY TO THE AFFECTED AREA TWICE DAILY  - CYANOCOBALAMIN ([employer] VITAMIN B12) 1000 MCG TABLET Take one tablet (1,000 mcg dose) by mouth daily.  - DIPHENOXYLATE-ATROPINE (LOMOTIL) 2.5-0.025 MG PER TABLET Take two tablets by mouth 4 (four) times daily.  - EZETIMIBE (ZETIA)  10 MG TABLET Take one tablet (10 mg dose) by mouth daily.  - FLUOXETINE (PROZAC) 40 MG CAPSULE Take one capsule (40 mg dose) by mouth daily.  - FUROSEMIDE (LASIX) 20 MG TABLET TAKE 1 TABLET(20 MG) BY MOUTH DAILY AS NEEDED  - GABAPENTIN (NEURONTIN) 100 MG CAPSULE Take three capsules (300 mg dose) by mouth daily.  - HYDRALAZINE HCL (APRESOLINE) 25 MG TABLET Take one tablet (25 mg dose) by mouth 4 (four) times daily.  - JANUMET XR [phone] MG TABLET TAKE 1 TABLET BY MOUTH DAILY WITH DINNER  - LACTULOSE (GENERLAC) 10 G/15 ML SOLUTION TITRATE DOSE UNTIL GOAL OF 2-3 SOFT BOWEL MOVEMENT PER DAY MET, STARTING WITH TAKING 45ML THREE TIMES DAILY AS DIRECTED  - METOPROLOL TARTRATE (LOPRESSOR) 50 MG TABLET TAKE WHOLE TABLET IN AM AND HALF TABLET IN PM  - MYRBETRIQ 25 MG TB24 24 HR TABLET TAKE 1 TABLET(25 MG) BY MOUTH DAILY  - NADOLOL (CORGARD) 40 MG TABLET Take 40 mg by mouth daily.  - OMEPRAZOLE (PRILOSEC) 40 MG CAPSULE Take one capsule (40 mg dose) by mouth daily.  - POLYETHYL GLYCOL-PROPYL GLYCOL (SYSTANE) 0.4-0.3 % OPHTHALMIC SOLUTION Apply to eye.  - TELMISARTAN-HYDROCHLOROTHIAZIDE (MICARDIS HCT) 80-25 MG PER TABLET Take 1 tablet by mouth daily.  - TRAMADOL (ULTRAM) 50 MG TABLET Take 50 mg by mouth 3 (three) times a day as needed.  - TRAZODONE (DESYREL) 50 MG TABLET Take one tablet (50 mg dose) by mouth at bedtime for 30 days.   Social History: Lives at [facility] with husband who has Alzheimer's. 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 Denies tobacco, alcohol, or other drug use.   Family History:  Family History  Adopted: Yes    Allergies: Allergies as of 10/22/2020 - Review Complete 10/22/2020  Allergen Reaction Noted  . Acetaminophen  09/23/2013  . Cefazolin Other (See Comments) 01/14/2015  . Ciprofloxacin hcl Nausea Only 09/28/2010  . Ibuprofen  09/23/2013  . Sulfa antibiotics Hives 01/06/2014   . Naproxen sodium  12/28/2010  . Amoxicillin Diarrhea 10/12/2014   Past Medical History:  Diagnosis Date  . Anemia   . Anxiety   . Cataract   . Depression   . Diabetes mellitus without complication ([place])   . Diverticulitis    Bled  . Esophageal varices ([place])   . Gallstones   . GERD (gastroesophageal reflux disease)   . Heart murmur   . Hx of migraines   . Hyperlipidemia   . Hypertension   . IC (interstitial cystitis)   . Irritable bowel syndrome   . NASH (nonalcoholic steatohepatitis)   . Obesity   . Pneumonia   . PVC (premature ventricular contraction)   . Vulva cancer ([place])     Review of Systems: A complete ROS was negative except as per HPI.   Physical Exam: Blood pressure (!) 194/79, pulse 60, temperature 98.3 F (36.8 C), temperature source Oral, resp. rate 12, height 5' 5"  (1.651 m), weight 86.6 kg, SpO2  96 %. Constitutional: well-appearing woman lying in ED stretcher, in no acute distress HENT: normocephalic atraumatic, mucous membranes moist Eyes: conjunctiva non-erythematous Neck: supple Cardiovascular: regular rate and rhythm, holosystolic murmur, no rubs or gallops; 1-2+ lower extremity pitting edema to the mid shin Pulmonary/Chest: normal work of breathing on room air, lungs clear to auscultation bilaterally Abdominal: soft, non-distended, mild RUQ tenderness to palpation, no suprapubic tenderness, no rebound or guarding MSK: normal bulk and tone Neurological: alert & oriented to person, place, time, and situation; moves all extremities equally; sensation grossly intact; mild asterixis Skin: warm and dry; telangiectasias over cheeks, chest, and upper back; palmar erythema Psych: normal mood and affect; speech is slow, and patient frequently loses her train of thought mid-sentence  Labs: CBC    Component Value Date/Time   WBC 4.5 10/22/2020 1708   RBC 3.49 (L) 10/22/2020 1708   HGB 9.3 (L) 10/22/2020 1708   HCT 29.9 (L) 10/22/2020 1708   PLT 188  10/22/2020 1708   MCV 85.7 10/22/2020 1708   MCH 26.6 10/22/2020 1708   MCHC 31.1 10/22/2020 1708   RDW 18.9 (H) 10/22/2020 1708   LYMPHSABS 0.5 (L) 10/20/2020 1008   MONOABS 0.7 10/20/2020 1008   EOSABS 0.1 10/20/2020 1008   BASOSABS 0.1 10/20/2020 1008     CMP     Component Value Date/Time   NA 136 10/22/2020 1708   K 3.0 (L) 10/22/2020 1708   CL 102 10/22/2020 1708   CO2 23 10/22/2020 1708   GLUCOSE 90 10/22/2020 1708   BUN 17 10/22/2020 1708   CREATININE 1.17 (H) 10/22/2020 1708   CALCIUM 9.3 10/22/2020 1708   PROT 6.8 10/22/2020 1708   ALBUMIN 2.9 (L) 10/22/2020 1708   AST 33 10/22/2020 1708   ALT 20 10/22/2020 1708   ALKPHOS 112 10/22/2020 1708   BILITOT 0.9 10/22/2020 1708   GFRNONAA 48 (L) 10/22/2020 1708   GFRAA >60 02/12/2020 0454    Imaging: CT Head Wo Contrast  Result Date: 10/22/2020 CLINICAL DATA:  Altered mental status EXAM: CT HEAD WITHOUT CONTRAST TECHNIQUE: Contiguous axial images were obtained from the base of the skull through the vertex without intravenous contrast. COMPARISON:  CT brain 02/09/2020 FINDINGS: Brain: No acute territorial infarction, hemorrhage, or intracranial mass. Moderate atrophy. Moderate hypodensity in the white matter consistent with chronic small vessel ischemic change. Stable ventricle size. Vascular: No hyperdense vessels. Vertebral and carotid vascular calcification Skull: No fracture. Stable thinning of the bilateral parietal bones. Sinuses/Orbits: No acute finding. Other: None IMPRESSION: 1. No CT evidence for acute intracranial abnormality. 2. Atrophy and chronic small vessel ischemic changes of the white matter. Electronically Signed   By: Donavan Foil M.D.   On: 10/22/2020 18:14   DG Chest Port 1 View  Result Date: 10/22/2020 CLINICAL DATA:  Shortness of breath EXAM: PORTABLE CHEST 1 VIEW COMPARISON:  February 09, 2020 FINDINGS: There is persistent cardiomegaly and vascular calcifications of the thoracic aorta. There is no  pneumothorax. There is vascular congestion. There are trace to small bilateral pleural effusions. There is an unchanged appearance of the upper mediastinum. IMPRESSION: 1. Cardiomegaly with findings of vascular congestion glued in small bilateral pleural effusions. 2. Relatively widened upper mediastinum, not substantially changed from prior study. These results were called by telephone at the time of interpretation on 10/22/2020 at 7:12 pm to provider CLAUDIA GIBBONS , who verbally acknowledged these results. Electronically Signed   By: Constance Holster M.D.   On: 10/22/2020 19:12    EKG: ordered  Assessment & Plan by Problem: Active Problems:   Encephalopathy   Leane Loring is a 75 year old woman with past medical history of NASH cirrhosis with hepatic encephalopathy, NSTEMI, premature ventricular contractions, hypertension, type 2 diabetes mellitus, CKD stage 3a, overactive bladder, bipolar disorder, vitamin B12 deficiency who presented to the [facility] ED via EMS from [place] with a 1-day history of confusion and admitted due to concern for hepatic encephalopathy.   Encephalopathy secondary to suspected hepatic encephalopathy Cirrhosis secondary to non-alcoholic steatohepatitis  Patient presents with confusion and elevated ammonia (80) after not taking lactulose for the last week. Mild asterixis. Last BM the day of admission. Follows with [facility]. On lactulose 10 g/15 mL solution self-titrated for goal 2-3 BM/day as well as nadolol 40 mg daily for grade I varices. She endorses dysuria and states she feels like she has a UTI, however no suprapubic tenderness, urinalysis clear, negative nitrite, small leukocytes (0-5 WBC), rare bacteria. Urine culture pending. Will hold off on antibiotics at this time and follow-up urine culture. CT head without acute intracranial abnormalities. No suspicion for toxic etiologies. BP significantly elevated which could contribute to encephalopathy. COVID-19  infection could be contributing to AMS as well. Highest suspicion for encephalopathy in setting of missed lactulose. She was previously on rifamixin however stopped due to cost.  - s/p 30 g lactulose in the ED - lactulose 30 g twice daily, titrate to 3-4 BMs per day - Holding nadolol in setting of bradycardia - Continue Lasix 20 mg PO daily as below - AM CMP - strict I/O, daily weights - Eventual PT/OT consult  Heart failure with preserved ejection fraction History of NSTEMI History of pericardial effusion History of PVCs Evaluated by cardiology for frequent PVCs and small pericardial effusions. Last visit on 10/11/20. 10/19/20 echo obtained to evaluate pericardial effusions demonstrated LVEF of 50 to 55% with mild concentric left ventricular hypertrophy with grade II diastolic dysfunction, severely dilated left atrium, mild mitral tricuspid and aortic regurgitation, no evidence of pericardial effusion. Reported 10 lb weight gain since December 2021.  - Continue home furosemide 20 mg daily, titrate as necessary - strict I/O, daily weights - Replete electrolytes to maintain K >4 and Mg >2 - Holding home metoprolol in setting of bradycardia  Lower extremity edema Patient with 1-2+ pitting edema to the mid shins bilaterally. Has been encouraged by PCP to wear compression stockings but has not due to discomfort. Prescribed furosemide 20 mg daily but ran out 1 week ago. Lower extremity doppler from 10/19/20 without evidence of DVT. Like multifactorial given recent echo showing HFpEF vs NASH cirrhosis vs venous insufficiency.  - Continue home furosemide 20 mg daily, titrate as necessary - Monitor electrolytes  COVID-19 infection Tested positive on admission. Symptoms of mild cough and rhinorrhea began 1 day prior to admission (2/17). No oxygen requirement. Patient is vaccinated and boosted. Husband tested positive two days prior to admission. No indication for remdesivir or steroids at this time.  -  guaifenesin 600 mg twice daily  Dysuria Overactive bladder On Myrbetriq 25 mg daily. As above, will follow-up urine culture. - f/u urine culture - Continue home Myrbetriq 25 mg daily  Widened mediastinum on CXR CXR with relatively widened upper mediastinum not substantially changed from prior study. Per radiology, could pursue additional imaging for further characterization and to rule out aortic dissection. Patient without chest pain and/or shortness of breath at this time. - Consider further imaging  Hypertension BP 160s-200/70s-80s on admission. Patient endorsing mild headache, no headache.  On home regimen of Micardis (telmisartan-HCTZ) 80-25 mg daily, hydralazine 25 mg four times daily, Lopressor (metoprolol) 50 mg qAM and 25 mg qPM, nadolol 40 mg daily. - irbesartan 300 mg and HCTZ 25 mg daily (formulary equivalent of home telmisartan-HCTZ) - Continue home hydralazine 25 mg q6h  Hypokalemia K 3.0 on admission.  - potassium chloride 40 mEq twice daily for 2 doses  Hyperlipidemia On ezetimibe (Zetia) 10 mg daily. Last lipid panel on 09/27/20 with total cholesterol 142, TG 86, LDL 82. - Continue home ezetimibe 10 mg daily  Type 2 diabetes mellitus On Janumet (sitagliptin metformin) XR [phone] mg daily. Last A1c 5.3 on 09/27/20 per Care Everywhere. A1c may be artificially low due to liver and kidney disease. - Lantus 14 units QHS - SSI moderate - Monitor CBGs  Bipolar disorder Has been referred to psychiatrist by her PCP. On aripiprazole 10 mg QHS, bupropion 300 mg daily, fluoxetine (Prozac) 40 mg daily, and gabapentin 300 mg daily. - Continue home aripiprazole 10 mg QHS - Continue home fluoxetine 40 mg daily - Continue bupropion 300 mg daily - Holding gabapentin in setting of confusion  Normocytic anemia Vitamin B12 deficiency On admission, H&H 9.3/29.9 with MCV 85.7. No signs or symptoms of bleeding. Per chart review, baseline hemoglobin appears to be 10-11 with most recent  hemoglobin 11.0 with MCV 84 and RDW 15.9 on 08/04/20. Last B12 338 on 09/27/20. Last ferritin 33 on 10/06/19. Suspect anemia multifactorial due to CKD and B12 deficiency.  - Consider iron studies during admission - AM CBC   Diet: Carb-Modified VTE: Enoxaparin IVF: None,None Code: Full  Prior to Admission Living Arrangement: Living at [facility] Anticipated Discharge Location: TBD Barriers to Discharge: further evaluation and treatment of encephalopathy  Dispo: Admit patient to Inpatient with expected length of stay greater than 2 midnights.  Signed: Alexandria Lodge, MD PGY-1 Internal Medicine Teaching Service Pager: [phone] 10/22/2020

## 2020-10-22 NOTE — ED Provider Notes (Signed)
[facility] EMERGENCY DEPARTMENT Provider Note   CSN: [id number] Arrival date & time: 10/22/20  1619     History Chief Complaint  Patient presents with  . Altered Mental Status    Sheila [name] is a 75 y.o. female with past medical history of non-STEMI, hypertension, diabetes on oral agents, Nash cirrhosis with hepatic encephalopathy, depression presents to the ED by [facility] from [facility] for evaluation of confusion.  History obtained from patient directly.  She is oriented to self, month, year and place.  Reports being "confused".  Describes it as being tired, having a hard time thinking of the words that she wants to use and forgetful.  This began sometime this morning but does not remember exactly.  Reports history of the same due to her "ammonia" levels in the past.  Reports that she ran out of lactulose and her fluid medicine about a week ago.  Has had less frequent bowel movements as well as increased bilateral leg swelling.  History of leg swelling but today legs are more swollen than usual.  No calf pain.  Remembers her cardiology recently did an echocardiogram and leg ultrasounds. Also reports feeling like she has a urinary tract infection because she has burning with urination and lower abdominal pain.  Reports history of frequent urinary tract infections, last one a couple of months ago.  She lives with her husband who was diagnosed with Covid 2 days ago.  Last night she had a runny nose and a dry cough.  Also reports being "fall risk" and having recent falls but has a hard time remembering the details about these falls.  Yesterday she almost fell but her husband grabbed her and she did not land on the ground or hit her head.  Denies fevers, sore throat, chest pain, shortness of breath, nausea, vomiting, abdominal pain, diarrhea.  No interventions.  No modifying factors.  She is vaccinated with a booster. Denies alcohol or recreational drug use.   HPI     Past  Medical History:  Diagnosis Date  . Anemia   . Anxiety   . Cataract   . Depression   . Diabetes mellitus without complication ([place])   . Diverticulitis    Bled  . Esophageal varices ([place])   . Gallstones   . GERD (gastroesophageal reflux disease)   . Heart murmur   . Hx of migraines   . Hyperlipidemia   . Hypertension   . IC (interstitial cystitis)   . Irritable bowel syndrome   . NASH (nonalcoholic steatohepatitis)   . Obesity   . Pneumonia   . PVC (premature ventricular contraction)   . Vulva cancer [facility])     Patient Active Problem List   Diagnosis Date Noted  . Pressure injury of skin 02/11/2020  . Encephalopathy 02/09/2020  . Esophageal varices determined by endoscopy ([place]) 06/30/2019  . Portal hypertensive gastropathy ([place]) 06/30/2019  . Elevated troponin   . Demand ischemia ([place])   . Essential hypertension   . NSTEMI (non-ST elevated myocardial infarction) ([place]) 05/23/2019  . Chest pain 05/22/2019  . Hepatic encephalopathy ([place])   . Liver cirrhosis ([place])   . NASH (nonalcoholic steatohepatitis)   . Weakness 05/13/2019  . AKI (acute kidney injury) ([place]) 05/13/2019    Past Surgical History:  Procedure Laterality Date  . ABDOMINAL HYSTERECTOMY     total  . CHOLECYSTECTOMY    . COLONOSCOPY    . FOOT SURGERY Bilateral    Bunion  . HERNIA REPAIR  OB History   No obstetric history on file.     Family History  Adopted: Yes    Social History   Tobacco Use  . Smoking status: Former Smoker    Packs/day: 1.00    Years: 25.00    Pack years: 25.00    Types: Cigarettes    Quit date: 2009    Years since quitting: 13.1  . Smokeless tobacco: Never Used  Vaping Use  . Vaping Use: Never used  Substance Use Topics  . Alcohol use: Never    Comment: rarely  . Drug use: Never    Home Medications Prior to Admission medications   Medication Sig Start Date End Date Taking? Authorizing Provider  ARIPiprazole (ABILIFY) 10 MG tablet Take 10 mg by mouth  at bedtime.    [provider]  buPROPion (WELLBUTRIN XL) 300 MG 24 hr tablet Take 300 mg by mouth daily. 04/15/19   [provider]  Calcium 500-125 MG-UNIT TABS Take 2 tablets by mouth daily.    [provider]  clobetasol cream (TEMOVATE) 9.16 % Apply 1 application topically daily as needed for rash. 11/01/16   [provider]  diclofenac sodium (VOLTAREN) 1 % GEL Apply 2 g topically 2 (two) times daily as needed for pain. 09/12/17   [provider]  diphenoxylate-atropine (LOMOTIL) 2.5-0.025 MG tablet Take 2 tablets by mouth 4 (four) times daily as needed. 11/19/17   [provider]  ezetimibe (ZETIA) 10 MG tablet Take 10 mg by mouth every morning. 08/05/18   [provider]  FLUoxetine (PROZAC) 40 MG capsule Take 40 mg by mouth daily.    [provider]  furosemide (LASIX) 20 MG tablet Take 20 mg by mouth daily. 10/04/20   [provider]  lactulose (CHRONULAC) 10 GM/15ML solution Take 45 mLs (30 g total) by mouth 2 (two) times daily. 02/12/20   Harvie Heck, MD  metoprolol tartrate (LOPRESSOR) 25 MG tablet Take 25 mg by mouth 2 (two) times daily. 08/01/17   [provider]  mirabegron ER (MYRBETRIQ) 25 MG TB24 tablet Take by mouth. 06/23/19   [provider]  nadolol (CORGARD) 40 MG tablet Take 1 tablet (40 mg total) by mouth daily. 06/30/19   Ladene Artist, MD  omeprazole (PRILOSEC) 40 MG capsule Take 40 mg by mouth daily. 12/22/14   [provider]  Polyethyl Glycol-Propyl Glycol (SYSTANE) 0.4-0.3 % SOLN Place 1 drop into both eyes 2 (two) times daily.    [provider]  SitaGLIPtin-MetFORMIN HCl (JANUMET XR) [phone] MG TB24 Take 1 tablet by mouth every evening. 11/05/17   [provider]  telmisartan-hydrochlorothiazide (MICARDIS HCT) 80-25 MG tablet Take 1 tablet by mouth daily. 10/01/17   [provider]  TRUE METRIX BLOOD GLUCOSE TEST test strip TEST BS BID  04/30/19   [provider]    Allergies    Acetaminophen, Cefazolin, Ciprofloxacin hcl, Ibuprofen, Sulfa antibiotics, Naproxen sodium, and Amoxicillin  Review of Systems   Review of Systems  HENT: Positive for rhinorrhea.   Respiratory: Positive for cough.   Cardiovascular: Positive for leg swelling.  Gastrointestinal: Positive for constipation.  Psychiatric/Behavioral: Positive for confusion.  All other systems reviewed and are negative.   Physical Exam Updated Vital Signs BP (!) 194/79   Pulse 60   Temp 98.3 F (36.8 C) (Oral)   Resp 12   Ht 5\' 5"  (1.651 m)   Wt 86.6 kg   SpO2 96%   BMI 31.77 kg/m  Physical Exam Vitals and nursing note reviewed.  Constitutional:      Appearance: She is well-developed.     Comments: Non toxic in NAD  HENT:     Head: Normocephalic and atraumatic.     Nose: Nose normal.     Mouth/Throat:     Mouth: Mucous membranes are dry.     Comments: Dry lips, MM tacky  Eyes:     Conjunctiva/sclera: Conjunctivae normal.     Comments: No scleral icterus   Cardiovascular:     Rate and Rhythm: Normal rate and regular rhythm.     Comments: 2+ symmetric pretibial edema. No calf tenderness. Skin normal over legs.  Pulmonary:     Effort: Pulmonary effort is normal.     Breath sounds: Normal breath sounds.  Abdominal:     General: Bowel sounds are normal.     Palpations: Abdomen is soft.     Tenderness: There is abdominal tenderness (suprapubic, distractable).     Comments: No G/R/R. No CVA tenderness. Negative Murphy's and McBurney's. Active BS to lower quadrants.   Musculoskeletal:        General: Normal range of motion.     Cervical back: Normal range of motion.  Skin:    General: Skin is warm and dry.     Capillary Refill: Capillary refill takes less than 2 seconds.  Neurological:     Mental Status: She is alert.     Comments:   Mental Status: Patient is awake, alert, oriented to person, place, year, and situation. Patient is  able to give appropriate history mostly - forgets specific details like dates and timing of events. Speech is slowed, slightly slurred.  Questionable aphasia. No signs of neglect.  Cranial Nerves: I not tested II temporal visual fields full bilaterally. PERRL.   III, IV, VI EOMs intact without ptosis V sensation to light touch intact in all 3 divisions of trigeminal nerve bilaterally  VII facial movements symmetric bilaterally VIII hearing intact to voice/conversation  IX, X no uvula deviation, symmetric rise of soft palate/uvula XI 5/5 SCM and trapezius strength bilaterally  XII tongue protrusion midline, symmetric L/R movements  Motor: Strength 5/5 in upper/lower extremities.  Sensation to light touch intact in face, upper/lower extremities. No pronator drift. No leg drop.  Cerebellar: No ataxia with finger to nose.   Psychiatric:        Behavior: Behavior normal.     ED Results / Procedures / Treatments   Labs (all labs ordered are listed, but only abnormal results are displayed) Labs Reviewed  RESP PANEL BY RT-PCR (FLU A&B, COVID) ARPGX2 - Abnormal; Notable for the following components:      Result Value   SARS Coronavirus 2 by RT PCR POSITIVE (*)    All other components within normal limits  COMPREHENSIVE METABOLIC PANEL - Abnormal; Notable for the following components:   Potassium 3.0 (*)    Creatinine, Ser 1.17 (*)    Albumin 2.9 (*)    GFR, Estimated 48 (*)    All other components within normal limits  CBC - Abnormal; Notable for the following components:   RBC 3.49 (*)    Hemoglobin 9.3 (*)    HCT 29.9 (*)    RDW 18.9 (*)    All other components within normal limits  AMMONIA - Abnormal; Notable for the following components:   Ammonia 80 (*)    All other components within normal limits  LIPASE, BLOOD - Abnormal; Notable for the following components:  Lipase 70 (*)    All other components within normal limits  URINALYSIS, ROUTINE W REFLEX MICROSCOPIC -  Abnormal; Notable for the following components:   Glucose, UA 100 (*)    Leukocytes,Ua SMALL (*)    All other components within normal limits  SALICYLATE LEVEL - Abnormal; Notable for the following components:   Salicylate Lvl <9.3 (*)    All other components within normal limits  ACETAMINOPHEN LEVEL - Abnormal; Notable for the following components:   Acetaminophen (Tylenol), Serum <10 (*)    All other components within normal limits  URINALYSIS, MICROSCOPIC (REFLEX) - Abnormal; Notable for the following components:   Bacteria, UA RARE (*)    All other components within normal limits  URINE CULTURE  PROTIME-INR  LACTIC ACID, PLASMA  RAPID URINE DRUG SCREEN, HOSP PERFORMED  ETHANOL  CBG MONITORING, ED  POC SARS CORONAVIRUS 2 AG -  ED    EKG None  Radiology CT Head Wo Contrast  Result Date: 10/22/2020 CLINICAL DATA:  Altered mental status EXAM: CT HEAD WITHOUT CONTRAST TECHNIQUE: Contiguous axial images were obtained from the base of the skull through the vertex without intravenous contrast. COMPARISON:  CT brain 02/09/2020 FINDINGS: Brain: No acute territorial infarction, hemorrhage, or intracranial mass. Moderate atrophy. Moderate hypodensity in the white matter consistent with chronic small vessel ischemic change. Stable ventricle size. Vascular: No hyperdense vessels. Vertebral and carotid vascular calcification Skull: No fracture. Stable thinning of the bilateral parietal bones. Sinuses/Orbits: No acute finding. Other: None IMPRESSION: 1. No CT evidence for acute intracranial abnormality. 2. Atrophy and chronic small vessel ischemic changes of the white matter. Electronically Signed   By: Donavan Foil M.D.   On: 10/22/2020 18:14   DG Chest Port 1 View  Result Date: 10/22/2020 CLINICAL DATA:  Shortness of breath EXAM: PORTABLE CHEST 1 VIEW COMPARISON:  February 09, 2020 FINDINGS: There is persistent cardiomegaly and vascular calcifications of the thoracic aorta. There is no  pneumothorax. There is vascular congestion. There are trace to small bilateral pleural effusions. There is an unchanged appearance of the upper mediastinum. IMPRESSION: 1. Cardiomegaly with findings of vascular congestion glued in small bilateral pleural effusions. 2. Relatively widened upper mediastinum, not substantially changed from prior study. These results were called by telephone at the time of interpretation on 10/22/2020 at 7:12 pm to provider Nefi Musich , who verbally acknowledged these results. Electronically Signed   By: Constance Holster M.D.   On: 10/22/2020 19:12    Procedures Procedures   Medications Ordered in ED Medications  lactulose (CHRONULAC) 10 GM/15ML solution 30 g (30 g Oral Given 10/22/20 2041)    ED Course  I have reviewed the triage vital signs and the nursing notes.  Pertinent labs & imaging results that were available during my care of the patient were reviewed by me and considered in my medical decision making (see chart for details).  Clinical Course as of 10/22/20 2053  Fri Oct 22, 2020  1628 Echo Feb 2022 1. Left ventricular ejection fraction, by estimation, is 50 to 55%. The left ventricle has low normal function. The left ventricle has no regional wall motion abnormalities. There is mild concentric left ventricular hypertrophy. Left ventricular diastolic parameters are consistent with Grade II diastolic dysfunction  (pseudonormalization). Elevated left ventricular end-diastolic pressure.  2. Right ventricular systolic function is normal. The right ventricular size is normal. There is normal pulmonary artery systolic pressure.  3. Left atrial size was severely dilated.  4. The mitral valve is normal  in structure. Trivial mitral valve regurgitation. No evidence of mitral stenosis.  5. The aortic valve is tricuspid. Aortic valve regurgitation is mild. No aortic stenosis is present.  6. The inferior vena cava is normal in size with greater than 50%  respiratory variability, suggesting right atrial pressure of 3 mmHg.  [CG]  1836 Creatinine(!): 1.17 [CG]  1836 Potassium(!): 3.0 [CG]  1836 Albumin(!): 2.9 [CG]  1836 GFR, Estimated(!): 48 [CG]  1836 Hemoglobin(!): 9.3 [CG]  1836 Ammonia(!): 80 [CG]  1836 AST: 33 [CG]  1836 ALT: 20 [CG]  1836 Alkaline Phosphatase: 112 [CG]  1915 SARS Coronavirus 2 by RT PCR(!): POSITIVE [CG]  1921 Received phone call from radiologist - concern for possible thoracic dissection on CTA done Sept 2020 coronal series 8 img 84 of 151 - did not appear to extend into AP on following CTAP. Recommends CTA dissection of chest at least. Will discuss with EDP [CG]  1941 Squamous Epithelial / LPF: 0-5 [CG]  1941 Bacteria, UA(!): RARE [CG]  1941 Chalmers Guest): SMALL [CG]  2051 DG Chest Port 1 View IMPRESSION: 1. Cardiomegaly with findings of vascular congestion glued in small bilateral pleural effusions. 2. Relatively widened upper mediastinum, not substantially changed from prior study. [CG]    Clinical Course User Index [CG] Arlean Hopping   MDM Rules/Calculators/A&P                           75 y.o. yo with chief complaint of confusion. Reports similar in the past when ammonia levels were elevated. Ran out of lactulose and diuretic 1 week ago. COVID exposure and has rhinorrhea, cough.   Previous medical records available, triage and nursing notes reviewed to obtain more history and assist with MDM  Additional information obtained from chart review   Chief complain involves an extensive number of treatment options and is a complaint that carries with it a high risk of complications and morbidity and mortality.    Differential diagnosis: hyperammonemia vs occult infection. Considered acute CVA TIA unlikely, she has normal neuro exam.  Denies ETOH and recreational drug use. COVID possible given exposure although appears stable with normal Spo2 and mild symptoms.   ER lab work and imaging ordered  by triage RN and me, as above  I have personally visualized and interpreted ER diagnostic work up including labs and imaging.    Labs reveal - K 3.0. Creatinine 1.17, GFR 48. Albumin 2.9, normal LFTs. Lipase 70 no abdominal tenderness, nausea, vomiting.  Ammonia 80 . Normal PT/INR. Stable anemia hemoglobin 9.3, HCT 29.9. PLT normal. Normal lactic acid. Undetectable ASA, APAP, ETOH and negative UDS.  +COVID. UA without infection.   Imaging reveals - EKG not crossing over, no acute ischemic changes. CT head unremarkable. CXR shows cardiomegaly with mild vascular congestion, small bilateral pleural effusions and widened upper mediastinum unchanged from previous. Of note, radiologist called me and discussed possible thoracic dissection on CTA PE 2021 vs atherosclerotic plaque.  Consider CTA dissection study.   Medications ordered - lactulose   Ordered continuous cardiac and pulse ox monitoring.  Will plan for serial re-examinations. Close monitoring.   Re-evaluated the patient.   No clinical decline. Reports increased coughing. Discussed questionable abnormal CTA PE 2021. States she is unaware of any issues with dissection or her aorta. Denies recent or current CP, back pain, abdominal pain.    Overall, clinical presentation and ER work up most suggestive of hepatic encephalopathy, COVID illness that appears  mild at this time.   Consulted IM teaching service who will admit patient. Discussed with EDP.  Final Clinical Impression(s) / ED Diagnoses Final diagnoses:  Hepatic encephalopathy [facility])    Rx / DC Orders ED Discharge Orders    None       Arlean Hopping 10/22/20 2053    Quintella Reichert, MD 10/23/20 1229

## 2020-10-22 NOTE — ED Notes (Signed)
Admitting provider at the bedside.

## 2020-10-22 NOTE — H&P (Incomplete)
Date: 10/22/2020               Patient Name:  Sheila [name] MRN: [id number]  DOB: Aug 19, 1946 Age / Sex: 75 y.o., female   PCP: Chesley Noon, MD         Medical Service: Internal Medicine Teaching Service         Attending Physician: Dr. Joni Reining    First Contact: Dr. Gaylan Gerold Pager: [phone]  Second Contact: Dr. Harvie Heck Pager: [phone]       After Hours (After 5p/  First Contact Pager: [phone]  weekends / holidays): Second Contact Pager: [phone]   Chief Complaint: altered mental status  History of Present Illness:   Sheila [name] is a 75 year old woman with past medical history of NASH cirrhosis with hepatic encephalopathy, NSTEMI, premature ventricular contractions, hypertension, type 2 diabetes mellitus, CKD stage 3a, bipolar disorder who presents to the [facility] ED via EMS from [place] with a 1-day history of confusion.  Patient states that she is currently confused and not able to relay her full story. She was in her usual state of health this afternoon when she was not able to work her cellular device. She decided to come because she was worried that her ammonia level was elevated 2/2 to her NASH, as this has happened in the past. She spoke with her primary care office, Dr. Melford Aase, and was instructed to go to the ED.   Has run out her furosemide and lactulose for at least a week.    Takes her medication from nurses at [employer].  Endorses feeling confused, cough, rhinorrhea, dizzy (chronic),  Denies: Diarrhea, abd pain, headache, chest pain, SHOB, numbness or tingling.    ED Course: Afebrile (Tmax 36.8), RR 12-18, P 60s, BP 160s-200/70s-80s, SpO2 >95% on room air. CBC with WBC 4.5, H&H 9.3/29.9 with MCV 85.7 (baseline Hgb 10-11), platelets 188k. PT/INR 14.6/1.2. Lipase 70. Lactic acid 1.7. Ammonia 80. POC SARS Ag negative, however COVID PCR positive. Urinalysis with clear yellow urine, SG 1.010, glucose 100, small leukocytes, 11-20 WBC, negative  rare bacteria otherwise unremarkable. Salicylate level <8.3, acetaminophen <10, ethanol <10, CBG 89. CMP with Na 136, K 3.0, Bicarb 23, glucose 90, BUN/Cr 17/1.17 with eGFR 48 (baseline sCr ~1), calcium 9.3, albumin 2.9, AST/ALT/ALP 33/20/112. CXR with cardiomegalcy with findings of vascular congestion and trace small bilateral pleural effusions, relatively widened upper mediastinum not substantially changed from prior study. CT head without acute intracranial abnormalities but with atrophy and chornic small vessel ischemic changes of the white matter.     Lab Orders     Urine culture     Resp Panel by RT-PCR (Flu A&B, Covid) Nasopharyngeal Swab     Comprehensive metabolic panel     CBC     Protime-INR - (order if patient is taking Coumadin / Warfarin)     Ammonia     Lipase, blood     Urinalysis, Routine w reflex microscopic Urine, Catheterized     Rapid urine drug screen (hospital performed)     Salicylate level     Acetaminophen level     Ethanol     Urinalysis, Microscopic (reflex)     CBG monitoring, ED     POC SARS Coronavirus 2 Ag-ED - Nasal Swab (BD Veritor Kit)   Meds:  No outpatient medications have been marked as taking for the 10/22/20 encounter [facility] Encounter).    Social History:  Lives with husband at [employer]. Has 6 kids.  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 Acetaminophen  09/23/2013  . Cefazolin Other (See Comments) 01/14/2015  . Ciprofloxacin hcl Nausea Only 09/28/2010  . Ibuprofen  09/23/2013  . Sulfa antibiotics Hives 01/06/2014  . Naproxen sodium  12/28/2010  . Amoxicillin Diarrhea 10/12/2014   Past Medical History:  Diagnosis Date  . Anemia   . Anxiety   . Cataract   . Depression   . Diabetes mellitus without complication ([place])   .  Diverticulitis    Bled  . Esophageal varices ([place])   . Gallstones   . GERD (gastroesophageal reflux disease)   . Heart murmur   . Hx of migraines   . Hyperlipidemia   . Hypertension   . IC (interstitial cystitis)   . Irritable bowel syndrome   . NASH (nonalcoholic steatohepatitis)   . Obesity   . Pneumonia   . PVC (premature ventricular contraction)   . Vulva cancer ([place])      Review of Systems: A complete ROS was negative except as per HPI.   Physical Exam: Blood pressure (!) 194/79, pulse 60, temperature 98.3 F (36.8 C), temperature source Oral, resp. rate 12, height _0  (1.651 m), weight 86.6 kg, SpO2 96 %. Constitutional: well-appearing *** lying in bed, in no acute distress HENT: normocephalic atraumatic, mucous membranes moist Eyes: conjunctiva non-erythematous Neck: supple Cardiovascular: regular rate and rhythm, no m/r/g Pulmonary/Chest: normal work of breathing on room air, lungs clear to auscultation bilaterally Abdominal: soft, non-tender, non-distended MSK: normal bulk and tone Neurological: alert & oriented x 3, 5/5 strength in bilateral upper and lower extremities, normal gait Skin: *** Psych: ***   Labs: CBC    Component Value Date/Time   WBC 4.5 10/22/2020 1708   RBC 3.49 (L) 10/22/2020 1708   HGB 9.3 (L) 10/22/2020 1708   HCT 29.9 (L) 10/22/2020 1708   PLT 188 10/22/2020 1708   MCV 85.7 10/22/2020 1708   MCH 26.6 10/22/2020 1708   MCHC 31.1 10/22/2020 1708   RDW 18.9 (H) 10/22/2020 1708   LYMPHSABS 0.5 (L) 10/20/2020 1008   MONOABS 0.7 10/20/2020 1008   EOSABS 0.1 10/20/2020 1008   BASOSABS 0.1 10/20/2020 1008     CMP     Component Value Date/Time   NA 136 10/22/2020 1708   K 3.0 (L) 10/22/2020 1708   CL 102 10/22/2020 1708   CO2 23 10/22/2020 1708   GLUCOSE 90 10/22/2020 1708   BUN 17 10/22/2020 1708   CREATININE 1.17 (H) 10/22/2020 1708   CALCIUM 9.3 10/22/2020 1708   PROT 6.8 10/22/2020 1708   ALBUMIN 2.9 (L) 10/22/2020 1708    AST 33 10/22/2020 1708   ALT 20 10/22/2020 1708   ALKPHOS 112 10/22/2020 1708   BILITOT 0.9 10/22/2020 1708   GFRNONAA 48 (L) 10/22/2020 1708   GFRAA >60 02/12/2020 0454    Imaging: CT Head Wo Contrast  Result Date: 10/22/2020 CLINICAL DATA:  Altered mental status EXAM: CT HEAD WITHOUT CONTRAST TECHNIQUE: Contiguous axial images were obtained from the base of the skull through the vertex without intravenous contrast. COMPARISON:  CT brain 02/09/2020 FINDINGS: Brain: No acute territorial infarction, hemorrhage, or intracranial mass. Moderate atrophy. Moderate hypodensity in the white matter consistent with chronic small vessel ischemic change. Stable ventricle size. Vascular: No hyperdense vessels. Vertebral and  carotid vascular calcification Skull: No fracture. Stable thinning of the bilateral parietal bones. Sinuses/Orbits: No acute finding. Other: None IMPRESSION: 1. No CT evidence for acute intracranial abnormality. 2. Atrophy and chronic small vessel ischemic changes of the white matter. Electronically Signed   By: Donavan Foil M.D.   On: 10/22/2020 18:14   DG Chest Port 1 View  Result Date: 10/22/2020 CLINICAL DATA:  Shortness of breath EXAM: PORTABLE CHEST 1 VIEW COMPARISON:  February 09, 2020 FINDINGS: There is persistent cardiomegaly and vascular calcifications of the thoracic aorta. There is no pneumothorax. There is vascular congestion. There are trace to small bilateral pleural effusions. There is an unchanged appearance of the upper mediastinum. IMPRESSION: 1. Cardiomegaly with findings of vascular congestion glued in small bilateral pleural effusions. 2. Relatively widened upper mediastinum, not substantially changed from prior study. These results were called by telephone at the time of interpretation on 10/22/2020 at 7:12 pm to provider CLAUDIA GIBBONS , who verbally acknowledged these results. Electronically Signed   By: Constance Holster M.D.   On: 10/22/2020 19:12    EKG:  personally reviewed my interpretation is***  Assessment & Plan by Problem: Active Problems:   Encephalopathy   Via Rosado is a 75 y.o. *** with pertinent PMH of *** who presented with *** and admit for *** on hospital day 0  *** ***   Non-alcoholic steatohepatitis  ***Follows with GI. On lactulose 10 g/15 mL solution self-titrated.  *** ***  Lower extremity edema ***Has been encouraged by PCP to wear compression stockings. Prescribed furosemide 20 mg daily.  Hypertension ***On home regimen of Lopressor (metoprolol) 50 mg qAM and 25 mg qPM, nadolol 40 mg daily, Micardis (telmisartan-HCTZ) 80-25 mg daily, hydralazine 25 mg four times daily.  Dysuria Overactive bladder On Myrbetriq 25 mg daily.   History of NSTEMI History of pericardial effusion PVCs Evaluated by cardiology for frequent PVCs and small pericardial effusions.  Hyperlipidemia On ezetimibe (Zetia) 10 mg daily. Last lipid panel on 09/27/20 with total cholesterol 142, TG 86, LDL 82.  Type 2 diabetes mellitus ***On Janumet (sitagliptin metformin) XR [phone] mg daily. Last A1c 5.3 on 09/27/20 per Care Everywhere.   Bipolar disorder ***Has been referred to psychiatrist by her PCP. On aripiprazole 10 mg QHS, bupropion 300 mg daily, and fluoxetine (Prozac) 40 mg daily.  Normocytic anemia Vitamin B12 deficiency On admission, H&H 9.3/29.9 with MCV 85.7. Per chart review, baseline hemoglobin appears to be 10-11 with most recent hemoglobin 11.0 with MCV 84 and RDW 15.9 on 08/04/20. - Continue home B12 1000 mcg daily.   Diet: {NAMES:3044014::"Normal","Heart Healthy","Carb-Modified","Renal","Carb/Renal","NPO","TPN","Tube Feeds"} VTE: {NAMES:3044014::"Heparin","Enoxaparin","SCDs","NOAC","None"} IVF: {NAMES:3044014::"None","NS","1/2 NS","LR","D5","D10"},{NAMES:3044014::"None","10cc/hr","25cc/hr","50cc/hr","75cc/hr","100cc/hr","110cc/hr","125cc/hr","Bolus"} Code: {NAMES:3044014::"Full","DNR","DNI","DNR/DNI","Comfort  Care","Unknown"}  Prior to Admission Living Arrangement: {NAMES:3044014::"Home, living ***","SNF, ***","Homeless","***"} Anticipated Discharge Location: {NAMES:3044014::"Home","SNF","CIR","***"} Barriers to Discharge: ***  Dispo: Admit patient to Inpatient with expected length of stay greater than 2 midnights.  Signed: Alexandria Lodge, MD PGY-1 Internal Medicine Teaching Service Pager: [phone] 10/22/2020

## 2020-10-22 NOTE — ED Triage Notes (Signed)
Pt brought to ED by [facility] from [place] for c/o increase confusion since she woke up today, pt having bilateral leg swollen that started today, lives with husband that is Covid +. BP 178/84, HR 62, SPO2 97% RA, CBG 128.

## 2020-10-22 NOTE — ED Notes (Signed)
Date and time results received: 10/22/20   Test: Covid Critical Value: Positive Name of Provider Notified: Ralene Bathe.

## 2020-10-22 NOTE — ED Notes (Signed)
IM   Kinnie Feil, PA-C 10/22/20 2015

## 2020-10-23 DIAGNOSIS — I10 Essential (primary) hypertension: Secondary | ICD-10-CM

## 2020-10-23 DIAGNOSIS — K7290 Hepatic failure, unspecified without coma: Principal | ICD-10-CM

## 2020-10-23 DIAGNOSIS — E785 Hyperlipidemia, unspecified: Secondary | ICD-10-CM

## 2020-10-23 DIAGNOSIS — I509 Heart failure, unspecified: Secondary | ICD-10-CM

## 2020-10-23 DIAGNOSIS — U071 COVID-19: Secondary | ICD-10-CM | POA: Diagnosis present

## 2020-10-23 DIAGNOSIS — D519 Vitamin B12 deficiency anemia, unspecified: Secondary | ICD-10-CM

## 2020-10-23 DIAGNOSIS — F3189 Other bipolar disorder: Secondary | ICD-10-CM

## 2020-10-23 DIAGNOSIS — E876 Hypokalemia: Secondary | ICD-10-CM | POA: Diagnosis present

## 2020-10-23 DIAGNOSIS — E119 Type 2 diabetes mellitus without complications: Secondary | ICD-10-CM

## 2020-10-23 LAB — CBC
HCT: 29.5 % — ABNORMAL LOW (ref 36.0–46.0)
Hemoglobin: 9.8 g/dL — ABNORMAL LOW (ref 12.0–15.0)
MCH: 27.6 pg (ref 26.0–34.0)
MCHC: 33.2 g/dL (ref 30.0–36.0)
MCV: 83.1 fL (ref 80.0–100.0)
Platelets: 191 10*3/uL (ref 150–400)
RBC: 3.55 MIL/uL — ABNORMAL LOW (ref 3.87–5.11)
RDW: 18.6 % — ABNORMAL HIGH (ref 11.5–15.5)
WBC: 4.4 10*3/uL (ref 4.0–10.5)
nRBC: 0 % (ref 0.0–0.2)

## 2020-10-23 LAB — COMPREHENSIVE METABOLIC PANEL
ALT: 16 U/L (ref 0–44)
AST: 38 U/L (ref 15–41)
Albumin: 2.5 g/dL — ABNORMAL LOW (ref 3.5–5.0)
Alkaline Phosphatase: 99 U/L (ref 38–126)
Anion gap: 9 (ref 5–15)
BUN: 13 mg/dL (ref 8–23)
CO2: 25 mmol/L (ref 22–32)
Calcium: 9.2 mg/dL (ref 8.9–10.3)
Chloride: 104 mmol/L (ref 98–111)
Creatinine, Ser: 1.02 mg/dL — ABNORMAL HIGH (ref 0.44–1.00)
GFR, Estimated: 57 mL/min — ABNORMAL LOW (ref >60)
Glucose, Bld: 137 mg/dL — ABNORMAL HIGH (ref 70–99)
Potassium: 3.2 mmol/L — ABNORMAL LOW (ref 3.5–5.1)
Sodium: 138 mmol/L (ref 135–145)
Total Bilirubin: 1.2 mg/dL (ref 0.3–1.2)
Total Protein: 6.5 g/dL (ref 6.5–8.1)

## 2020-10-23 LAB — GLUCOSE, CAPILLARY
Glucose-Capillary: 104 mg/dL — ABNORMAL HIGH (ref 70–99)
Glucose-Capillary: 144 mg/dL — ABNORMAL HIGH (ref 70–99)
Glucose-Capillary: 69 mg/dL — ABNORMAL LOW (ref 70–99)
Glucose-Capillary: 83 mg/dL (ref 70–99)
Glucose-Capillary: 85 mg/dL (ref 70–99)
Glucose-Capillary: 93 mg/dL (ref 70–99)
Glucose-Capillary: 94 mg/dL (ref 70–99)

## 2020-10-23 LAB — MAGNESIUM: Magnesium: 1.8 mg/dL (ref 1.7–2.4)

## 2020-10-23 LAB — HEMOGLOBIN A1C
Hgb A1c MFr Bld: 5.4 % (ref 4.8–5.6)
Mean Plasma Glucose: 108.28 mg/dL

## 2020-10-23 MED ORDER — MAGNESIUM SULFATE 50 % IJ SOLN: 1.0000 g | Freq: Once | INTRAMUSCULAR | Status: DC

## 2020-10-23 MED ORDER — EZETIMIBE 10 MG PO TABS
10.0000 mg | ORAL_TABLET | Freq: Every morning | ORAL | Status: DC
  Administered 2020-10-23 – 2020-11-02 (×11): 10 mg via ORAL
  Filled 2020-10-23 (×7): qty 1

## 2020-10-23 MED ORDER — HYDRALAZINE HCL 25 MG PO TABS
25.0000 mg | ORAL_TABLET | Freq: Four times a day (QID) | ORAL | Status: DC
  Administered 2020-10-23 – 2020-11-02 (×41): 25 mg via ORAL
  Filled 2020-10-23 (×42): qty 1

## 2020-10-23 MED ORDER — FLUOXETINE HCL 20 MG PO CAPS
40.0000 mg | ORAL_CAPSULE | Freq: Every day | ORAL | Status: DC
  Administered 2020-10-23 – 2020-11-02 (×11): 40 mg via ORAL
  Filled 2020-10-23 (×11): qty 2

## 2020-10-23 MED ORDER — SPIRONOLACTONE 25 MG PO TABS
50.0000 mg | ORAL_TABLET | Freq: Every day | ORAL | Status: DC
  Administered 2020-10-23 – 2020-10-24 (×2): 50 mg via ORAL
  Filled 2020-10-23 (×2): qty 2

## 2020-10-23 MED ORDER — ARIPIPRAZOLE 10 MG PO TABS
10.0000 mg | ORAL_TABLET | Freq: Every day | ORAL | Status: DC
  Administered 2020-10-23 – 2020-11-01 (×11): 10 mg via ORAL
  Filled 2020-10-23 (×12): qty 1

## 2020-10-23 MED ORDER — BUPROPION HCL ER (XL) 150 MG PO TB24
300.0000 mg | ORAL_TABLET | Freq: Every day | ORAL | Status: DC
  Administered 2020-10-23 – 2020-11-02 (×11): 300 mg via ORAL
  Filled 2020-10-23 (×11): qty 2

## 2020-10-23 MED ORDER — MAGNESIUM SULFATE IN D5W 1-5 GM/100ML-% IV SOLN
1.0000 g | Freq: Once | INTRAVENOUS | Status: AC
  Administered 2020-10-23: 1 g via INTRAVENOUS
  Filled 2020-10-23: qty 100

## 2020-10-23 MED ORDER — LIP MEDEX EX OINT
TOPICAL_OINTMENT | CUTANEOUS | Status: DC | PRN
  Administered 2020-10-27: 1 via TOPICAL
  Filled 2020-10-23: qty 7

## 2020-10-23 MED ORDER — MIRABEGRON ER 25 MG PO TB24
25.0000 mg | ORAL_TABLET | Freq: Every day | ORAL | Status: DC
  Administered 2020-10-23 – 2020-10-26 (×4): 25 mg via ORAL
  Filled 2020-10-23 (×4): qty 1

## 2020-10-23 NOTE — Progress Notes (Signed)
HD#1 Subjective:   Patient is seen at bedside.  She appears pleasant and in no acute distress.  She states that she is confused because she was missing her lactulose for 1 week.  Patient is alert and oriented to person, place and situation.  She states that she was living at [facility] with her husband.  States that she had an aide who come by to help with her ADLs and medication.  Patient denies chest pain or shortness of breath.  She reports an episode of lower back pain that started yesterday but has improved significantly now.  States that she has lower extremity edema which was evaluated by her GI doctor and was prescribed Lasix.  Patient has not had a bowel movement since admission.    Objective:  Vital signs in last 24 hours: Vitals:   10/23/20 0005 10/23/20 0150 10/23/20 0400 10/23/20 0500  BP: (!) 200/78 (!) 184/75 (!) 136/56   Pulse: 63  60   Resp: 19  20   Temp: 98.1 F (36.7 C)  98.2 F (36.8 C)   TempSrc: Oral  Oral   SpO2: 95%  94%   Weight:    81.8 kg  Height:       Supplemental O2: Room Air SpO2: 94 %   Physical Exam:  Physical Exam Constitutional:      General: She is not in acute distress. HENT:     Head: Normocephalic.  Eyes:     General:        Right eye: No discharge.        Left eye: No discharge.  Cardiovascular:     Rate and Rhythm: Normal rate and regular rhythm.  Pulmonary:     Effort: Pulmonary effort is normal. No respiratory distress.  Abdominal:     General: Bowel sounds are normal.     Tenderness: There is no abdominal tenderness.  Musculoskeletal:     Cervical back: Normal range of motion.     Comments: Lower back nontender to palpation  Skin:    General: Skin is warm.  Neurological:     Mental Status: She is alert.  Psychiatric:        Mood and Affect: Mood normal.     Filed Weights   10/22/20 1623 10/23/20 0500  Weight: 86.6 kg 81.8 kg     Intake/Output Summary (Last 24 hours) at 10/23/2020 0731 Last data filed at  10/23/2020 9381 Gross per 24 hour  Intake 340.67 ml  Output --  Net 340.67 ml   Net IO Since Admission: 340.67 mL [10/23/20 0731]  Pertinent Labs: CBC Latest Ref Rng & Units 10/23/2020 10/22/2020 10/20/2020  WBC 4.0 - 10.5 K/uL 4.4 4.5 4.1  Hemoglobin 12.0 - 15.0 g/dL 9.8(L) 9.3(L) 9.8(L)  Hematocrit 36.0 - 46.0 % 29.5(L) 29.9(L) 29.6(L)  Platelets 150 - 400 K/uL 191 188 192.0    CMP Latest Ref Rng & Units 10/23/2020 10/22/2020 10/20/2020  Glucose 70 - 99 mg/dL 137(H) 90 149(H)  BUN 8 - 23 mg/dL 13 17 17   Creatinine 0.44 - 1.00 mg/dL 1.02(H) 1.17(H) 1.05  Sodium 135 - 145 mmol/L 138 136 136  Potassium 3.5 - 5.1 mmol/L 3.2(L) 3.0(L) 3.2(L)  Chloride 98 - 111 mmol/L 104 102 101  CO2 22 - 32 mmol/L 25 23 26   Calcium 8.9 - 10.3 mg/dL 9.2 9.3 9.3  Total Protein 6.5 - 8.1 g/dL 6.5 6.8 7.2  Total Bilirubin 0.3 - 1.2 mg/dL 1.2 0.9 1.0  Alkaline Phos 38 -  126 U/L 99 112 122(H)  AST 15 - 41 U/L 38 33 26  ALT 0 - 44 U/L 16 20 16     Imaging: CT Head Wo Contrast  Result Date: 10/22/2020 CLINICAL DATA:  Altered mental status EXAM: CT HEAD WITHOUT CONTRAST TECHNIQUE: Contiguous axial images were obtained from the base of the skull through the vertex without intravenous contrast. COMPARISON:  CT brain 02/09/2020 FINDINGS: Brain: No acute territorial infarction, hemorrhage, or intracranial mass. Moderate atrophy. Moderate hypodensity in the white matter consistent with chronic small vessel ischemic change. Stable ventricle size. Vascular: No hyperdense vessels. Vertebral and carotid vascular calcification Skull: No fracture. Stable thinning of the bilateral parietal bones. Sinuses/Orbits: No acute finding. Other: None IMPRESSION: 1. No CT evidence for acute intracranial abnormality. 2. Atrophy and chronic small vessel ischemic changes of the white matter. Electronically Signed   By: Donavan Foil M.D.   On: 10/22/2020 18:14   DG Chest Port 1 View  Addendum Date: 10/22/2020   ADDENDUM REPORT:  10/22/2020 21:56 ADDENDUM: For impression labeled #1 it should state that there are findings of vascular congestion with small bilateral pleural effusions. The word "glued" is an error. Electronically Signed   By: Constance Holster M.D.   On: 10/22/2020 21:56   Result Date: 10/22/2020 CLINICAL DATA:  Shortness of breath EXAM: PORTABLE CHEST 1 VIEW COMPARISON:  February 09, 2020 FINDINGS: There is persistent cardiomegaly and vascular calcifications of the thoracic aorta. There is no pneumothorax. There is vascular congestion. There are trace to small bilateral pleural effusions. There is an unchanged appearance of the upper mediastinum. IMPRESSION: 1. Cardiomegaly with findings of vascular congestion glued in small bilateral pleural effusions. 2. Relatively widened upper mediastinum, not substantially changed from prior study. These results were called by telephone at the time of interpretation on 10/22/2020 at 7:12 pm to provider CLAUDIA GIBBONS , who verbally acknowledged these results. Electronically Signed: By: Constance Holster M.D. On: 10/22/2020 19:12    Assessment/Plan:   Active Problems:   Encephalopathy   Patient Summary:  Sheila [name] is a 75 year old woman with past medical history of NASH cirrhosis with hepatic encephalopathy, NSTEMI, PVC, hypertension, type 2 diabetes mellitus, CKD stage 3a, overactive bladder, bipolar disorder, vitamin B12 deficiency who presented to the [facility] ED via EMS from [place] with a 1-day history of confusion and admitted due to concern for hepatic encephalopathy.   Encephalopathy secondary to suspected hepatic encephalopathy Cirrhosis secondary to non-alcoholic steatohepatitis  Her altered mental status is likely due to hepatic encephalopathy from missing Lactulose for 1 week. CT head was negative. Patient is alert and oriented but still appreciate slight confusion this morning. The IM team evaluated her a few months ago and her mentation at that time  was sharper. Will restart lactulose and monitor her bowel movements. - Restart lactulose 30 g BID. Titrate to 3-4 BMs per day - strict I/O, daily weights - PT/OT consult   Heart failure with preserved ejection fraction History of NSTEMI History of PVCs LE edema noted on exam. O2 sat normal on room air.  - Continue home furosemide 20 mg daily, titrate as necessary - strict I/O, daily weights - Replete electrolytes to maintain K >4 and Mg >2 - Holding home metoprolol in setting of bradycardia   COVID-19 infection Tested positive on admission. Symptoms of mild cough and rhinorrhea began 1 day prior to admission (2/17). No oxygen requirement. Patient is vaccinated and boosted. Husband tested positive two days prior to admission. No indication for remdesivir  or steroids at this time.  - guaifenesin 600 mg twice daily   Dysuria Overactive bladder On Myrbetriq 25 mg daily.  Patient report burning sensation with urination. Her UA was not very convincing for UTI. No treatment at this time. We will follow-up urine culture. - f/u urine culture - Continue home Myrbetriq 25 mg daily   Widened mediastinum on CXR CXR with relatively widened upper mediastinum not substantially changed from prior study. Denies chest pain or shortness of breath. Likely due to rotation position on X-ray. Bilateral UE blood pressure check were similar. No concern for aortic dissection at this time.    Hypertension - Continue irbesartan 300 mg and HCTZ 25 mg daily  - Continue home hydralazine 25 mg q6h   Hyperlipidemia - Continue home ezetimibe 10 mg daily   Type 2 diabetes mellitus Last A1c 5.3 on 09/27/20 which can be falsely low. Patient had a episode of hypoglycemia. Will hold lantus.   - SSI moderate - Monitor CBGs   Bipolar disorder - Continue home aripiprazole 10 mg QHS - Continue home fluoxetine 40 mg daily - Continue bupropion 300 mg daily - Holding gabapentin in setting of  confusion   Normocytic anemia Vitamin B12 deficiency Last B12 338 on 09/27/20. Last ferritin 33 on 10/06/19. Suspect anemia multifactorial due to CKD and B12 deficiency.  - Check iron study and ferritin - AM CBC   Diet: Carb-Modified VTE: Enoxaparin IVF: None Code: Full  Prior to Admission Living Arrangement: Living at [facility] Anticipated Discharge Location: TBD Barriers to Discharge: further evaluation and treatment of encephalopathy    Gaylan Gerold, DO 10/23/2020, 7:31 AM Pager: [phone]  Please contact the on call pager after 5 pm and on weekends at [phone].

## 2020-10-23 NOTE — Progress Notes (Signed)
Hypoglycemic Event  CBG: 69  Treatment: order placed, graham crackers/peanut butter given, also apple juice  Symptoms: none  Follow-up CBG: Time: 0031 CBG Result:85  Possible Reasons for Event: no dinner, was in ER  Comments/MD notified:Dr. Shon Baton, orders to hold Lantus.    Pola Corn

## 2020-10-24 LAB — AMMONIA: Ammonia: 60 umol/L — ABNORMAL HIGH (ref 9–35)

## 2020-10-24 LAB — COMPREHENSIVE METABOLIC PANEL
ALT: 19 U/L (ref 0–44)
AST: 33 U/L (ref 15–41)
Albumin: 2.5 g/dL — ABNORMAL LOW (ref 3.5–5.0)
Alkaline Phosphatase: 82 U/L (ref 38–126)
Anion gap: 9 (ref 5–15)
BUN: 12 mg/dL (ref 8–23)
CO2: 23 mmol/L (ref 22–32)
Calcium: 9.1 mg/dL (ref 8.9–10.3)
Chloride: 107 mmol/L (ref 98–111)
Creatinine, Ser: 1.08 mg/dL — ABNORMAL HIGH (ref 0.44–1.00)
GFR, Estimated: 53 mL/min — ABNORMAL LOW (ref >60)
Glucose, Bld: 86 mg/dL (ref 70–99)
Potassium: 3.5 mmol/L (ref 3.5–5.1)
Sodium: 139 mmol/L (ref 135–145)
Total Bilirubin: 1.2 mg/dL (ref 0.3–1.2)
Total Protein: 6.3 g/dL — ABNORMAL LOW (ref 6.5–8.1)

## 2020-10-24 LAB — IRON AND TIBC
Iron: 40 ug/dL (ref 28–170)
Saturation Ratios: 9 % — ABNORMAL LOW (ref 10.4–31.8)
TIBC: 427 ug/dL (ref 250–450)
UIBC: 387 ug/dL

## 2020-10-24 LAB — CBC
HCT: 29 % — ABNORMAL LOW (ref 36.0–46.0)
Hemoglobin: 9.1 g/dL — ABNORMAL LOW (ref 12.0–15.0)
MCH: 26.7 pg (ref 26.0–34.0)
MCHC: 31.4 g/dL (ref 30.0–36.0)
MCV: 85 fL (ref 80.0–100.0)
Platelets: 188 10*3/uL (ref 150–400)
RBC: 3.41 MIL/uL — ABNORMAL LOW (ref 3.87–5.11)
RDW: 18.9 % — ABNORMAL HIGH (ref 11.5–15.5)
WBC: 5.7 10*3/uL (ref 4.0–10.5)
nRBC: 0 % (ref 0.0–0.2)

## 2020-10-24 LAB — GLUCOSE, CAPILLARY
Glucose-Capillary: 98 mg/dL (ref 70–99)
Glucose-Capillary: 125 mg/dL — ABNORMAL HIGH (ref 70–99)
Glucose-Capillary: 98 mg/dL (ref 70–99)
Glucose-Capillary: 124 mg/dL — ABNORMAL HIGH (ref 70–99)

## 2020-10-24 LAB — FERRITIN: Ferritin: 12 ng/mL (ref 11–307)

## 2020-10-24 MED ORDER — LACTULOSE 10 GM/15ML PO SOLN
30.0000 g | Freq: Three times a day (TID) | ORAL | Status: DC
  Administered 2020-10-24 – 2020-10-25 (×4): 30 g via ORAL
  Filled 2020-10-24 (×4): qty 45

## 2020-10-24 MED ORDER — NADOLOL 40 MG PO TABS
40.0000 mg | ORAL_TABLET | Freq: Every day | ORAL | Status: DC
Start: 2020-10-24 — End: 2020-10-26
  Administered 2020-10-24 – 2020-10-26 (×3): 40 mg via ORAL
  Filled 2020-10-24 (×3): qty 1

## 2020-10-24 MED ORDER — FUROSEMIDE 40 MG PO TABS
40.0000 mg | ORAL_TABLET | Freq: Every day | ORAL | Status: DC
  Administered 2020-10-25 – 2020-11-02 (×9): 40 mg via ORAL
  Filled 2020-10-24 (×9): qty 1

## 2020-10-24 MED ORDER — SPIRONOLACTONE 25 MG PO TABS
100.0000 mg | ORAL_TABLET | Freq: Every day | ORAL | Status: DC
  Administered 2020-10-25 – 2020-11-02 (×9): 100 mg via ORAL
  Filled 2020-10-24 (×9): qty 4

## 2020-10-24 MED ORDER — SODIUM CHLORIDE 0.9 % IV SOLN
510.0000 mg | Freq: Once | INTRAVENOUS | Status: AC
  Administered 2020-10-24: 510 mg via INTRAVENOUS
  Filled 2020-10-24: qty 17

## 2020-10-24 MED ORDER — ACETAMINOPHEN 325 MG PO TABS
650.0000 mg | ORAL_TABLET | Freq: Four times a day (QID) | ORAL | Status: AC | PRN
  Administered 2020-10-24: 650 mg via ORAL
  Filled 2020-10-24: qty 2

## 2020-10-24 MED ORDER — LIDOCAINE 5 % EX PTCH
1.0000 | MEDICATED_PATCH | CUTANEOUS | Status: DC
  Administered 2020-10-24 – 2020-11-02 (×10): 1 via TRANSDERMAL
  Filled 2020-10-24 (×11): qty 1

## 2020-10-24 NOTE — Progress Notes (Signed)
Dr. Johnney Ou notified regarding increased confusion and lethargy. Pt admitted with encephalopathy, alert and oriented x 4 with appropriate but delayed responses on admission. Pt now currently only alert to self. Pt has to be cued/prompted when questions are asked. Pt unable to find words to respond resulting in her falling back to sleep. Orders received to recheck Ammonia level this am. Pt still without bowel movement after several doses of Lactulose, see MAR. Neuro checks done overnight. Safety precautions in place, bed alarm on. Will continue to monitor pt.

## 2020-10-24 NOTE — Plan of Care (Signed)
  Problem: Education: Goal: Knowledge of General Education information will improve Description: Including pain rating scale, medication(s)/side effects and non-pharmacologic comfort measures Outcome: Progressing   Problem: Activity: Goal: Risk for activity intolerance will decrease Outcome: Progressing   Problem: Safety: Goal: Ability to remain free from injury will improve Outcome: Progressing   Problem: Skin Integrity: Goal: Risk for impaired skin integrity will decrease Outcome: Progressing    Vital signs stable overnight. Pt was more lethargic and having a lot of difficulty finding her words overnight. Was only able to answer questions with guidance for the answers, MD made aware, no new orders at this time. Will continue to monitor.

## 2020-10-24 NOTE — Evaluation (Signed)
Physical Therapy Evaluation Patient Details Name: Sheila [name] MRN: [id number] DOB: [birth year] Today's Date: 10/24/2020   History of Present Illness  Pt is a 75 y.o. female who presents from [facility] with AMS and a cough. She has been out of her furosemide and lactulose for at least a week. Pt tested positive for COVID on admission. Admitted 2/2 concern for hepatic encephalopathy from missing Lactulose meds. PMH: [name] cirrhosis and hepatic encephalopathy, CAD, HTN, DM2, CKD stage IIIa, bipolar disorder, vulva cancer, PVC, obese, and heart murmur.  Clinical Impression  Pt presents with condition above and deficits mentioned below, see PT Problem List. Pt is poor historian as she has some long and short term memory deficits this date, thus home set-up and equipment may be inaccurate. Pt very anxious in regards to her current condition and forgetting she is sick one moment to the next. Currently, she requires extensive assistance of maxA to come to stand and modA to maintain her static standing balance and take several small steps with a RW due to decreased leg strength, coordination, and balance. She displays a posterior lean and inadequate weight shifting and feet clearance with gait, placing her at high risk for falls. Will continue to follow acutely. Recommending SNF for therapy upon d/c to maximize her safety and independence with all functional mobility to allow her to return to her independence living facility apartment with her husband.    Follow Up Recommendations SNF;Supervision/Assistance - 24 hour    Equipment Recommendations  Rolling walker with 5" wheels;3in1 (PT) (unclear of current equipment at home)    Recommendations for Other Services       Precautions / Restrictions Precautions Precautions: Fall Restrictions Weight Bearing Restrictions: No      Mobility  Bed Mobility Overal bed mobility: Needs Assistance Bed Mobility: Supine to Sit;Sit to Supine     Supine to sit:  Min guard;HOB elevated Sit to supine: Min guard   General bed mobility comments: Extra time and repeated cues to remain on task to come sit EOB. Extra effort for all utilizing bed rails and control, min guard for safety.    Transfers Overall transfer level: Needs assistance Equipment used: Rolling walker (2 wheeled) Transfers: Sit to/from Stand Sit to Stand: Max assist         General transfer comment: Pt attempting to pull up on RW, cued her to push up from bed with improved buttocks clearance but still needing maxA and extra time and effort to power up to stand and transition hands to RW.  Ambulation/Gait Ambulation/Gait assistance: Mod assist Gait Distance (Feet): 4 Feet Assistive device: Rolling walker (2 wheeled) Gait Pattern/deviations: Step-through pattern;Decreased step length - right;Decreased step length - left;Decreased dorsiflexion - right;Decreased dorsiflexion - left;Decreased weight shift to right;Decreased weight shift to left;Decreased stride length;Shuffle;Trunk flexed Gait velocity: reduced Gait velocity interpretation: <1.31 ft/sec, indicative of household ambulator General Gait Details: Pt needing cues to initiate and sequence steps, displaying poor weight shifting and leg advancement with bil knee instability and excessive ankle reatcional strategies. ModA for stability and to manage RW taking a few steps anteriorly and posteriorly.  Stairs            Wheelchair Mobility    Modified Rankin (Stroke Patients Only)       Balance Overall balance assessment: Needs assistance Sitting-balance support: Bilateral upper extremity supported;Feet supported Sitting balance-Leahy Scale: Poor Sitting balance - Comments: UE support for static sitting EOB, min guard for safety. Postural control: Posterior lean Standing balance support: Bilateral  upper extremity supported Standing balance-Leahy Scale: Poor Standing balance comment: ModA and bil UEs on RW for static  standing due to posterior lean.                             Pertinent Vitals/Pain Pain Assessment: No/denies pain    Home Living Family/patient expects to be discharged to:: Assisted living (independent living facility; [facility])               Home Equipment: Gilford Rile - 4 wheels;Shower seat;Hospital bed;Wheelchair - manual Additional Comments: Pt with poor memory of equipment and set-up. Pt reports having a walk-in shower and lives in an apartment with her husband. They have an aide do the cleaning. They do not cook.    Prior Function Level of Independence: Independent with assistive device(s)         Comments: Pt walks with RW, but poor historian as poor memory this date.     Hand Dominance   Dominant Hand: Right    Extremity/Trunk Assessment   Upper Extremity Assessment Upper Extremity Assessment: Defer to OT evaluation    Lower Extremity Assessment Lower Extremity Assessment: Generalized weakness;Difficult to assess due to impaired cognition    Cervical / Trunk Assessment Cervical / Trunk Assessment: Kyphotic  Communication   Communication: No difficulties  Cognition Arousal/Alertness: Awake/alert Behavior During Therapy: Anxious Overall Cognitive Status: No family/caregiver present to determine baseline cognitive functioning                                 General Comments: Pt oriented to self, location, and needing list of options to choose current date. Poor long and short term memory as unsure of home set-up and PLOF and repeatedly asking why she is weak and forgetting that she was told she was sick one moment to the next. Poor insight into deficits and safety.      General Comments General comments (skin integrity, edema, etc.): HR 60-80s, SpO2 >/= 93%, RR up to 20s    Exercises     Assessment/Plan    PT Assessment Patient needs continued PT services  PT Problem List Decreased strength;Decreased activity  tolerance;Decreased balance;Decreased mobility;Decreased coordination;Decreased cognition;Decreased knowledge of use of DME;Decreased safety awareness;Decreased knowledge of precautions;Cardiopulmonary status limiting activity       PT Treatment Interventions DME instruction;Gait training;Functional mobility training;Therapeutic activities;Therapeutic exercise;Balance training;Neuromuscular re-education;Cognitive remediation;Patient/family education    PT Goals (Current goals can be found in the Care Plan section)  Acute Rehab PT Goals Patient Stated Goal: to improve her cognition PT Goal Formulation: With patient Time For Goal Achievement: 11/07/20 Potential to Achieve Goals: Fair    Frequency Min 3X/week   Barriers to discharge        Co-evaluation               AM-PAC PT "6 Clicks" Mobility  Outcome Measure Help needed turning from your back to your side while in a flat bed without using bedrails?: A Little Help needed moving from lying on your back to sitting on the side of a flat bed without using bedrails?: A Little Help needed moving to and from a bed to a chair (including a wheelchair)?: A Lot Help needed standing up from a chair using your arms (e.g., wheelchair or bedside chair)?: A Lot Help needed to walk in hospital room?: A Lot Help needed climbing 3-5 steps with a railing? :  Total 6 Click Score: 13    End of Session   Activity Tolerance: Patient limited by fatigue Patient left: in bed;with call bell/phone within reach;with bed alarm set Nurse Communication: Mobility status PT Visit Diagnosis: Unsteadiness on feet (R26.81);Other abnormalities of gait and mobility (R26.89);Muscle weakness (generalized) (M62.81);Difficulty in walking, not elsewhere classified (R26.2)    Time: 1430-1455 PT Time Calculation (min) (ACUTE ONLY): 25 min   Charges:   PT Evaluation $PT Eval Moderate Complexity: 1 Mod PT Treatments $Therapeutic Activity: 8-22 mins         Moishe Spice, PT, DPT Acute Rehabilitation Services  Pager: [phone] Office: [phone]   Orvan Falconer 10/24/2020, 3:10 PM

## 2020-10-24 NOTE — Progress Notes (Addendum)
HD#2 Subjective:  Overnight Events: Episode of confusion noted by RN.   [name] was evaluated at bedside. She reports feeling well this morning. No acute concerns at this time. However, only oriented to self, unable to tell location or year.  Still has not had a BM.   Objective:  Vital signs in last 24 hours: Vitals:   10/23/20 2320 10/23/20 2322 10/24/20 0352 10/24/20 0500  BP: (!) 147/65 (!) 147/65 (!) 160/64   Pulse:  63 62   Resp:  20 16   Temp:  98.9 F (37.2 C) 98.4 F (36.9 C)   TempSrc:  Axillary Oral   SpO2:  94% 97%   Weight:    80.7 kg  Height:       Supplemental O2: Room Air SpO2: 97 %   Physical Exam:  Physical Exam Constitutional:      General: She is not in acute distress.    Comments: Only oriented to person. Could not remember her birthday  HENT:     Head: Normocephalic.  Eyes:     General:        Right eye: No discharge.        Left eye: No discharge.  Cardiovascular:     Rate and Rhythm: Normal rate and regular rhythm.  Pulmonary:     Effort: Pulmonary effort is normal. No respiratory distress.  Abdominal:     General: Bowel sounds are normal.  Musculoskeletal:     Right lower leg: Edema (+1) present.     Left lower leg: Edema (+1) present.  Skin:    General: Skin is warm.  Neurological:     Mental Status: She is alert.     Filed Weights   10/22/20 1623 10/23/20 0500 10/24/20 0500  Weight: 86.6 kg 81.8 kg 80.7 kg     Intake/Output Summary (Last 24 hours) at 10/24/2020 0700 Last data filed at 10/24/2020 0357 Gross per 24 hour  Intake --  Output 650 ml  Net -650 ml   Net IO Since Admission: -309.33 mL [10/24/20 0700]  Pertinent Labs: CBC Latest Ref Rng & Units 10/24/2020 10/23/2020 10/22/2020  WBC 4.0 - 10.5 K/uL 5.7 4.4 4.5  Hemoglobin 12.0 - 15.0 g/dL 9.1(L) 9.8(L) 9.3(L)  Hematocrit 36.0 - 46.0 % 29.0(L) 29.5(L) 29.9(L)  Platelets 150 - 400 K/uL 188 191 188    CMP Latest Ref Rng & Units 10/24/2020 10/23/2020  10/22/2020  Glucose 70 - 99 mg/dL 86 137(H) 90  BUN 8 - 23 mg/dL 12 13 17   Creatinine 0.44 - 1.00 mg/dL 1.08(H) 1.02(H) 1.17(H)  Sodium 135 - 145 mmol/L 139 138 136  Potassium 3.5 - 5.1 mmol/L 3.5 3.2(L) 3.0(L)  Chloride 98 - 111 mmol/L 107 104 102  CO2 22 - 32 mmol/L 23 25 23   Calcium 8.9 - 10.3 mg/dL 9.1 9.2 9.3  Total Protein 6.5 - 8.1 g/dL 6.3(L) 6.5 6.8  Total Bilirubin 0.3 - 1.2 mg/dL 1.2 1.2 0.9  Alkaline Phos 38 - 126 U/L 82 99 112  AST 15 - 41 U/L 33 38 33  ALT 0 - 44 U/L 19 16 20     Imaging: No results found.  Assessment/Plan:   Principal Problem:   Encephalopathy, hepatic (HCC) Active Problems:   Liver cirrhosis (HCC)   Essential hypertension   Hypokalemia   COVID-19 virus infection   Type 2 diabetes mellitus ([place])   Patient Summary: Sheila [name] a 75 year old woman with past medical history of NASH cirrhosis with hepatic encephalopathy,NSTEMI,PVC,hypertension,  type 2 diabetes mellitus, CKD stage 3a,overactive bladder, bipolar disorder, vitamin B12 deficiencywho presentedto [facility] via EMS from [place] with a 1-day history of confusion and admitted due to concern for hepatic encephalopathy.   Encephalopathy secondary to suspected hepatic encephalopathy Cirrhosis secondary to non-alcoholic steatohepatitis Her mental status appears to be worsened today.  Patient still not had a bowel movement.  Will increase lactulose to 30 g 3 times a day.  Patient was on rifaximin mean in the past with good results, however discontinued due to cost.  If her mental status does not improve with lactulose alone, will add rifaximin. EGD 2020 revealed grade I esophageal varices. Will resume Nadolol.  - Increase lactulose 30 g TID. Titrate to 3-4 BMs per day - Resume Nadolol - PT/OT consult   Normocytic anemia Vitamin B12 deficiency Iron study and ferritin confirms IDA. She had a colonoscopy in 2018 which showed diverticular bleed. Her hgb stable during this  admission. No sign of active bleeding. Will give 1 does of Feraheme  - AM CBC   Hypertension - Hold HCTZ and Irbesartan due to history of cirrhosis - Continue home hydralazine 25 mg q6h - Increase Spironolactone to 100 mg daily for elevated BP   Heart failure with preserved ejection fraction History of NSTEMI History ofPVCs on Metoprolol O2 sat normal on room air. Did not make much urine yesterday, will increase Lasix to 40 mg. - PO Lasix 40 mg daily  - strict I/O, daily weights - Holding home metoprolol    COVID-19 infection No indication for remdesivir or steroids at this time.    Dysuria Overactive bladder On Myrbetriq 25 mg daily. Patient report burning sensation with urination. Her UA was not very convincing for UTI. No treatment at this time. We will follow-up urine culture. - f/u urine culture - Continue home Myrbetriq 25 mg daily   Widened mediastinum on CXR CXR with relatively widened upper mediastinum not substantially changed from prior study. Denies chest pain or shortness of breath. Likely due to rotation position on X-ray. Bilateral UE blood pressure check were similar. No concern for aortic dissection at this time.    Hyperlipidemia - Continue home ezetimibe 10 mg daily   Type 2 diabetes mellitus Last A1c 5.3 on 09/27/20 which can be falsely low. Patient had a episode of hypoglycemia. Will hold lantus.   - SSI moderate - Monitor CBGs - Consider hold metformin after discharge in the setting of cirrhosis.    Bipolar disorder - Continue home aripiprazole 10 mg QHS - Continue home fluoxetine 40 mg daily - Continue bupropion 300 mg daily - Holdinggabapentinin setting of confusion   Diet:Carb-Modified NKN:[id number] XTK:WIOX Code:Full  Prior to Admission Living [place] at [facility] Anticipated Discharge Location:TBD Barriers to Discharge:further evaluation and treatment of encephalopathy    Sheila Gerold,  DO 10/24/2020, 7:00 AM Pager: [phone]  Please contact the on call pager after 5 pm and on weekends at [phone].

## 2020-10-24 NOTE — Plan of Care (Signed)
  Pt alert to self and able to state own birthday tonight. Pt still with delayed responses. NAD noted. Bed alarm in place. Will continue to monitor.   Problem: Activity: Goal: Risk for activity intolerance will decrease Outcome: Progressing   Problem: Safety: Goal: Ability to remain free from injury will improve Outcome: Progressing   Problem: Skin Integrity: Goal: Risk for impaired skin integrity will decrease Outcome: Progressing

## 2020-10-24 NOTE — TOC Initial Note (Signed)
Transition of Care [facility]) - Initial/Assessment Note    Patient Details  Name: Sheila [name] MRN: [id number] Date of Birth: 1946/06/20  Transition of Care [facility]) CM/SW Contact:    Trula Ore, [place] Phone Number: 10/24/2020, 3:41 PM  Clinical Narrative:                   CSW received consult for possible SNF placement at time of discharge. CSW spoke with patients spouse Broadus John regarding PT recommendation of SNF placement at time of discharge. Patients spouse was not sure if patient is from [facility] or Independent Living at [place]. CSW tried to call [facility] to try and confirm but was unable to reach anyone. TOC team will follow up in the am to confirm.Patients spouse would like for patient to received PT/OT from [facility] if they offer it there.  Patient spouse unsure if patient has received the COVID vaccines. CSW let patients spouse know we will follow up with him about patients plan once we have spoke with [facility]. No further questions reported at this time. CSW to continue to follow and assist with discharge planning needs.    Barriers to Discharge: Continued Medical Work up   Patient Goals and CMS Choice   CMS Medicare.gov Compare Post Acute Care list provided to:: Patient Represenative (must comment) (spouse Broadus John) Choice offered to / list presented to : Spouse  Expected Discharge Plan and Services                                                Prior Living Arrangements/Services   Lives with:: Self,Spouse Patient language and need for interpreter reviewed:: Yes        Need for Family Participation in Patient Care: Yes (Comment) Care giver support system in place?: Yes (comment)   Criminal Activity/Legal Involvement Pertinent to Current Situation/Hospitalization: No - Comment as needed  Activities of Daily Living Home Assistive Devices/Equipment: Walker (specify type) ADL Screening (condition at time of admission) Patient's cognitive  ability adequate to safely complete daily activities?: Yes Is the patient deaf or have difficulty hearing?: Yes Does the patient have difficulty seeing, even when wearing glasses/contacts?: No Does the patient have difficulty concentrating, remembering, or making decisions?: Yes Patient able to express need for assistance with ADLs?: Yes Does the patient have difficulty dressing or bathing?: No Independently performs ADLs?: Yes (appropriate for developmental age) Does the patient have difficulty walking or climbing stairs?: Yes Weakness of Legs: Both Weakness of Arms/Hands: None  Permission Sought/Granted Permission sought to share information with : Case Manager,Family Chief Financial Officer Permission granted to share information with : Yes, Verbal Permission Granted     Permission granted to share info w AGENCY: [facility]        Emotional Assessment       Orientation: : Oriented to Self Alcohol / Substance Use: Not Applicable Psych Involvement: No (comment)  Admission diagnosis:  Hepatic encephalopathy (HCC) [K72.90] SOB (shortness of breath) [R06.02] Encephalopathy [G93.40] Patient Active Problem List   Diagnosis Date Noted  . Hypokalemia 10/23/2020  . COVID-19 virus infection 10/23/2020  . Type 2 diabetes mellitus ([place]) 10/23/2020  . Pressure injury of skin 02/11/2020  . Encephalopathy, hepatic ([place]) 02/09/2020  . Esophageal varices determined by endoscopy ([place]) 06/30/2019  . Portal hypertensive gastropathy ([place]) 06/30/2019  . Elevated troponin   . Demand ischemia ([place])   .  Essential hypertension   . NSTEMI (non-ST elevated myocardial infarction) ([place]) 05/23/2019  . Chest pain 05/22/2019  . Hepatic encephalopathy ([place])   . Liver cirrhosis ([place])   . NASH (nonalcoholic steatohepatitis)   . Weakness 05/13/2019  . AKI (acute kidney injury) ([place]) 05/13/2019   PCP:  Chesley Noon, MD Pharmacy:   [facility], [place] -  [address] AT [address] [place] [address] Phone: [phone] Fax: [place], [place] [address] [place] [address] Phone: [phone] Fax: [place], [place] - [address] [place] [address] Phone: [phone] Fax: [phone]     Social Determinants of Health (SDOH) Interventions    Readmission Risk Interventions Readmission Risk Prevention Plan 05/15/2019  Post Dischage Appt Complete  Medication Screening Complete  Transportation Screening Complete

## 2020-10-25 LAB — URINE CULTURE

## 2020-10-25 LAB — COMPREHENSIVE METABOLIC PANEL
ALT: 20 U/L (ref 0–44)
AST: 37 U/L (ref 15–41)
Albumin: 2.5 g/dL — ABNORMAL LOW (ref 3.5–5.0)
Alkaline Phosphatase: 79 U/L (ref 38–126)
Anion gap: 8 (ref 5–15)
BUN: 10 mg/dL (ref 8–23)
CO2: 22 mmol/L (ref 22–32)
Calcium: 9 mg/dL (ref 8.9–10.3)
Chloride: 109 mmol/L (ref 98–111)
Creatinine, Ser: 0.96 mg/dL (ref 0.44–1.00)
GFR, Estimated: >60 mL/min (ref >60)
Glucose, Bld: 97 mg/dL (ref 70–99)
Potassium: 3.3 mmol/L — ABNORMAL LOW (ref 3.5–5.1)
Sodium: 139 mmol/L (ref 135–145)
Total Bilirubin: 1.2 mg/dL (ref 0.3–1.2)
Total Protein: 6.2 g/dL — ABNORMAL LOW (ref 6.5–8.1)

## 2020-10-25 LAB — URINALYSIS, ROUTINE W REFLEX MICROSCOPIC
Bilirubin Urine: NEGATIVE
Glucose, UA: NEGATIVE mg/dL
Hgb urine dipstick: NEGATIVE
Ketones, ur: NEGATIVE mg/dL
Leukocytes,Ua: NEGATIVE
Nitrite: NEGATIVE
Protein, ur: 100 mg/dL — AB
Specific Gravity, Urine: 1.014 (ref 1.005–1.030)
pH: 5 (ref 5.0–8.0)

## 2020-10-25 LAB — CBC
HCT: 29 % — ABNORMAL LOW (ref 36.0–46.0)
Hemoglobin: 9.2 g/dL — ABNORMAL LOW (ref 12.0–15.0)
MCH: 26.8 pg (ref 26.0–34.0)
MCHC: 31.7 g/dL (ref 30.0–36.0)
MCV: 84.5 fL (ref 80.0–100.0)
Platelets: 144 10*3/uL — ABNORMAL LOW (ref 150–400)
RBC: 3.43 MIL/uL — ABNORMAL LOW (ref 3.87–5.11)
RDW: 18.8 % — ABNORMAL HIGH (ref 11.5–15.5)
WBC: 4.9 10*3/uL (ref 4.0–10.5)
nRBC: 0 % (ref 0.0–0.2)

## 2020-10-25 LAB — GLUCOSE, CAPILLARY
Glucose-Capillary: 120 mg/dL — ABNORMAL HIGH (ref 70–99)
Glucose-Capillary: 125 mg/dL — ABNORMAL HIGH (ref 70–99)
Glucose-Capillary: 104 mg/dL — ABNORMAL HIGH (ref 70–99)
Glucose-Capillary: 162 mg/dL — ABNORMAL HIGH (ref 70–99)

## 2020-10-25 MED ORDER — POTASSIUM CHLORIDE CRYS ER 20 MEQ PO TBCR
40.0000 meq | EXTENDED_RELEASE_TABLET | Freq: Two times a day (BID) | ORAL | Status: AC
  Administered 2020-10-25 (×2): 40 meq via ORAL
  Filled 2020-10-25 (×2): qty 2

## 2020-10-25 MED ORDER — PHENYLEPHRINE-MINERAL OIL-PET 0.25-14-74.9 % RE OINT
1.0000 "application " | TOPICAL_OINTMENT | Freq: Two times a day (BID) | RECTAL | Status: DC | PRN
  Filled 2020-10-25 (×2): qty 57

## 2020-10-25 MED ORDER — HYDROCORTISONE (PERIANAL) 2.5 % EX CREA
TOPICAL_CREAM | Freq: Two times a day (BID) | CUTANEOUS | Status: DC | PRN
  Administered 2020-10-28 – 2020-11-01 (×2): 1 via RECTAL
  Filled 2020-10-25: qty 28.35

## 2020-10-25 MED ORDER — LACTULOSE 10 GM/15ML PO SOLN
30.0000 g | Freq: Four times a day (QID) | ORAL | Status: DC
  Administered 2020-10-25 – 2020-10-26 (×4): 30 g via ORAL
  Filled 2020-10-25 (×4): qty 45

## 2020-10-25 NOTE — Progress Notes (Addendum)
Physical Therapy Treatment Patient Details Name: Sheila [name] MRN: [id number] DOB: 1946/02/25 Today's Date: 10/25/2020    History of Present Illness Pt is a 75 y.o. female admitted from [facility] on 10/22/20 with AMS and cough; incidental (+) COVID-19. [place] for hepatic encephalopathy from missing lactulose meds for at least a week. PMH includes [name] cirrhosis, hepatic encephalopathy, CAD, HTN, DM2, CKD 3, vulva CA, bipolar disorder.   PT Comments    Pt slowly progressing with mobility. Today, pt requiring min guard to modA for sitting balance, modA+2 to stand and take a few steps; pt limited by generalized weakness, decreased activity tolerance, R toe pain (RN aware), and cognitive impairment, including decreased attention and poor problem solving. Continue to recommend SNF-level therapies to maximize functional mobility and independence prior to return home. If pt to return to ILF, will require increased assist from support staff for mobility and ADL tasks.    Follow Up Recommendations  SNF;Supervision for mobility/OOB     Equipment Recommendations  Rolling walker with 5" wheels;3in1 (PT);Wheelchair (measurements PT);Wheelchair cushion (measurements PT)    Recommendations for Other Services       Precautions / Restrictions Precautions Precautions: Fall;Other (comment) Precaution Comments: Painful R toes; c/o hemorrhoids Restrictions Weight Bearing Restrictions: No    Mobility  Bed Mobility Overal bed mobility: Needs Assistance Bed Mobility: Supine to Sit     Supine to sit: Mod assist;HOB elevated Sit to supine: Min guard   General bed mobility comments: ModA to assist trunk elevation and scoot hips to EOB; poor problem solving noted, decreased attention requiring frequent cues to complete task    Transfers Overall transfer level: Needs assistance Equipment used: 2 person hand held assist Transfers: Sit to/from [facility] Sit to Stand: Mod  assist;+2 physical assistance Stand pivot transfers: Mod assist;+2 physical assistance       General transfer comment: ModA+2 to assist trunk elevation with bed pad and bilateral HHA, pt able to achieve fully upright posture and take pivotal steps to recliner; posterior lean and c/o R toes pain limiting ability to stand  Ambulation/Gait                 Stairs             Wheelchair Mobility    Modified Rankin (Stroke Patients Only)       Balance Overall balance assessment: Needs assistance Sitting-balance support: Bilateral upper extremity supported;Feet supported Sitting balance-Leahy Scale: Fair Sitting balance - Comments: Initial modA for sitting balance, progressing to min guard without UE support Postural control: Right lateral lean Standing balance support: Bilateral upper extremity supported Standing balance-Leahy Scale: Poor Standing balance comment: Reliant on UE support and external assist to maintain standing                            Cognition Arousal/Alertness: Awake/alert Behavior During Therapy: WFL for tasks assessed/performed;Anxious Overall Cognitive Status: No family/caregiver present to determine baseline cognitive functioning Area of Impairment: Attention;Memory;Safety/judgement;Awareness;Problem solving;Following commands                   Current Attention Level: Sustained Memory: Decreased short-term memory Following Commands: Follows one step commands inconsistently Safety/Judgement: Decreased awareness of safety;Decreased awareness of deficits Awareness: Emergent Problem Solving: Slow processing;Decreased initiation;Difficulty sequencing;Requires verbal cues;Requires tactile cues General Comments: Pt requiring cues to keep eyes open. Very poor attention, easily distracted requiring frequent redirection to task      Exercises General Exercises -  Lower Extremity Ankle Circles/Pumps: AROM;Both;Seated Long Arc  Quad: AROM;Both;Seated    General Comments General comments (skin integrity, edema, etc.): HR 63, BP 129/56, SpO2 97% on RA      Pertinent Vitals/Pain Pain Assessment: Faces Faces Pain Scale: Hurts little more Pain Location: R-side toes, L ankle with palpation Pain Descriptors / Indicators: Discomfort Pain Intervention(s): Monitored during session (elevated bilateral lower leg edema)    Home Living Family/patient expects to be discharged to:: Assisted living (independent living facility; [facility])             Home Equipment: Gilford Rile - 4 wheels;Shower seat;Hospital bed;Wheelchair - manual Additional Comments: Pt with poor memory of equipment and set-up. Pt reports having a walk-in shower and lives in an apartment with her husband. They have an aide do the cleaning. They do not cook.    Prior Function Level of Independence: Independent with assistive device(s)      Comments: Pt walks with RW, but poor historian as poor memory this date.   PT Goals (current goals can now be found in the care plan section) Acute Rehab PT Goals Patient Stated Goal: to get stronger Progress towards PT goals: Progressing toward goals    Frequency    Min 3X/week      PT Plan Current plan remains appropriate    Co-evaluation              AM-PAC PT "6 Clicks" Mobility   Outcome Measure  Help needed turning from your back to your side while in a flat bed without using bedrails?: A Lot Help needed moving from lying on your back to sitting on the side of a flat bed without using bedrails?: A Lot Help needed moving to and from a bed to a chair (including a wheelchair)?: A Lot Help needed standing up from a chair using your arms (e.g., wheelchair or bedside chair)?: A Lot Help needed to walk in hospital room?: A Lot Help needed climbing 3-5 steps with a railing? : Total 6 Click Score: 11    End of Session Equipment Utilized During Treatment: Gait belt Activity Tolerance: Patient  tolerated treatment well;Patient limited by fatigue Patient left: in chair;with call bell/phone within reach;with chair alarm set Nurse Communication: Mobility status PT Visit Diagnosis: Unsteadiness on feet (R26.81);Other abnormalities of gait and mobility (R26.89);Muscle weakness (generalized) (M62.81);Difficulty in walking, not elsewhere classified (R26.2)     Time: 1004-1030 PT Time Calculation (min) (ACUTE ONLY): 26 min  Charges:  $Therapeutic Activity: 8-22 mins                    Mabeline Caras, PT, DPT Acute Rehabilitation Services  Pager [phone] Office [phone]  Derry Lory 10/25/2020, 1:15 PM

## 2020-10-25 NOTE — Progress Notes (Signed)
HD#3 Subjective:  Overnight Events: per RN, patient is able to state her name and birthday  States she remembers the team. She does not know how she feels. States her neck hurts. She is oriented to self and place. Able to state her full name and that she is at the hospital, but not which one. She does not know the year. Denies having a bowel movement yesterday. States her legs hurt.   Objective:  Vital signs in last 24 hours: Vitals:   10/24/20 2340 10/25/20 0417 10/25/20 0836 10/25/20 1136  BP: (!) 168/66 (!) 183/69  136/60  Pulse:  62    Resp: 19 19    Temp: 99.2 F (37.3 C) 98.4 F (36.9 C)    TempSrc: Oral Oral    SpO2:  95% 94%   Weight:  81.2 kg    Height:       Supplemental O2: Room Air SpO2: 94 %   Physical Exam:  Physical Exam Constitutional:      General: She is not in acute distress.    Comments: Slow to answer questions. Slow reaction   Eyes:     General:        Right eye: No discharge.        Left eye: No discharge.  Cardiovascular:     Rate and Rhythm: Normal rate and regular rhythm.  Pulmonary:     Effort: No respiratory distress.  Musculoskeletal:     Right lower leg: Edema (+1) present.     Left lower leg: Edema (+1) present.  Skin:    General: Skin is warm.  Neurological:     Mental Status: She is alert.  Psychiatric:        Mood and Affect: Mood normal.     Filed Weights   10/23/20 0500 10/24/20 0500 10/25/20 0417  Weight: 81.8 kg 80.7 kg 81.2 kg     Intake/Output Summary (Last 24 hours) at 10/25/2020 1336 Last data filed at 10/25/2020 1312 Gross per 24 hour  Intake 170 ml  Output 1000 ml  Net -830 ml   Net IO Since Admission: -376.33 mL [10/25/20 1336]  Pertinent Labs: CBC Latest Ref Rng & Units 10/25/2020 10/24/2020 10/23/2020  WBC 4.0 - 10.5 K/uL 4.9 5.7 4.4  Hemoglobin 12.0 - 15.0 g/dL 9.2(L) 9.1(L) 9.8(L)  Hematocrit 36.0 - 46.0 % 29.0(L) 29.0(L) 29.5(L)  Platelets 150 - 400 K/uL 144(L) 188 191    CMP Latest Ref Rng &  Units 10/25/2020 10/24/2020 10/23/2020  Glucose 70 - 99 mg/dL 97 86 137(H)  BUN 8 - 23 mg/dL 10 12 13   Creatinine 0.44 - 1.00 mg/dL 0.96 1.08(H) 1.02(H)  Sodium 135 - 145 mmol/L 139 139 138  Potassium 3.5 - 5.1 mmol/L 3.3(L) 3.5 3.2(L)  Chloride 98 - 111 mmol/L 109 107 104  CO2 22 - 32 mmol/L 22 23 25   Calcium 8.9 - 10.3 mg/dL 9.0 9.1 9.2  Total Protein 6.5 - 8.1 g/dL 6.2(L) 6.3(L) 6.5  Total Bilirubin 0.3 - 1.2 mg/dL 1.2 1.2 1.2  Alkaline Phos 38 - 126 U/L 79 82 99  AST 15 - 41 U/L 37 33 38  ALT 0 - 44 U/L 20 19 16     Imaging: No results found.  Assessment/Plan:   Principal Problem:   Encephalopathy, hepatic (HCC) Active Problems:   Liver cirrhosis (HCC)   Essential hypertension   Hypokalemia   COVID-19 virus infection   Type 2 diabetes mellitus ([place])   Patient Summary: Sheila [name] a  75 year old woman with past medical history of NASH cirrhosis with hepatic encephalopathy,NSTEMI,PVC,hypertension, type 2 diabetes mellitus, CKD stage 3a,overactive bladder, bipolar disorder, vitamin B12 deficiencywho presentedto the [facility] via EMS from [place] with a 1-day history of confusion and admitted due to concern for hepatic encephalopathy.   Encephalopathy secondary to suspected hepatic encephalopathy Cirrhosis secondary to non-alcoholic steatohepatitis Her mental status not improving. Still not have a BM. Increase lactulose to 30 g four times daily. If no BM by tmr, will attempt Lactulose enema.  Patient was on rifaximin mean in the past with good results, however discontinued due to cost.  If her mental status does not improve with lactulose alone, will add rifaximin.  -Increase lactulose30 g four times daily. Titrate to 3-4 BMs per day - Resume Nadolol - PT/OT recommend SNF   Acute urinary retention Bladder scan today showed 999 cc. Could be inaccurate due to presence of ascites. I/O collected 500 cc. Abdominal US suggest ascites.  Patient complained of  dysuria on admission but UA showed low suspicion for UTI and urine culture grew multiple species. Will obtain repeat UA and urine culture.    Normocytic anemia Vitamin B12 deficiency Iron deficiency anemia Patient received 1 does of Feraheme yesterday. Can give an additional does in 3 days.  - AM CBC   Hypertension BP in good range, 136/60 - Hold HCTZ and Irbesartan due to history of cirrhosis - Continue home hydralazine 25 mg q6h - Continue Spironolactone to 100 mg daily for elevated BP   Heart failure with preserved ejection fraction History of NSTEMI History ofPVCs on Metoprolol O2 sat normal on room air.Did not make much urine yesterday, will increase Lasix to 40 mg. - PO Lasix 40 mg daily  - strict I/O, daily weights - Holding home metoprolol    COVID-19 infection No indication for remdesivir or steroids at this time.    Hyperlipidemia - Continue home ezetimibe 10 mg daily   Type 2 diabetes mellitus Last A1c 5.3 on 1/24/22which can be falsely low.  - SSI moderate - Monitor CBGs - Will hold metformin after discharge in the setting of cirrhosis.    Bipolar disorder - Continue home aripiprazole 10 mg QHS - Continue home fluoxetine 40 mg daily - Continue bupropion 300 mg daily - Holdinggabapentinin setting of confusion   Diet:Carb-Modified YFV:CBSWHQPRFF MBW:GYKZ Code:Full  Prior to Admission Living [place] at [facility] Anticipated Discharge Location:TBD Barriers to Discharge:further evaluation and treatment of encephalopathy Gaylan Gerold, DO 10/25/2020, 1:36 PM Pager: [phone]  Please contact the on call pager after 5 pm and on weekends at [phone].

## 2020-10-25 NOTE — Progress Notes (Signed)
Occupational Therapy Evaluation Patient Details Name: Sheila [name] MRN: [id number] DOB: 1946/05/19 Today's Date: 10/25/2020    History of Present Illness Pt is a 75 y.o. female who presents from [facility] with AMS and a cough. She has been out of her furosemide and lactulose for at least a week. Pt tested positive for COVID on admission. Admitted 2/2 concern for hepatic encephalopathy from missing Lactulose meds. PMH: Sheila [name] cirrhosis and hepatic encephalopathy, CAD, HTN, DM2, CKD stage IIIa, bipolar disorder, vulva cancer, PVC, obese, and heart murmur.   Clinical Impression   Pt apparently from [place] at [place]. Currently requires mod A +2 for limited mobility of stand and pivot to chair and Max A with LB ADL secondary to deficits listed below. VSS on RA during session. Will benefit form rehab at SNF to maximize functional level of independence. Will follow acutely.    Follow Up Recommendations  SNF    Equipment Recommendations  3 in 1 bedside commode    Recommendations for Other Services       Precautions / Restrictions Precautions Precautions: Fall Precaution Comments: very painful toes Restrictions Weight Bearing Restrictions: No      Mobility Bed Mobility Overal bed mobility: Needs Assistance Bed Mobility: Supine to Sit;Sit to Supine     Supine to sit: HOB elevated;Mod assist Sit to supine: Min guard   General bed mobility comments: Required Mod A to progress to EOB; poor problem solving noted    Transfers Overall transfer level: Needs assistance Equipment used: Rolling walker (2 wheeled) Transfers: Sit to/from Stand Sit to Stand: Max assist         General transfer comment: +2 A with use of Bed pad and gait belt    Balance Overall balance assessment: Needs assistance Sitting-balance support: Bilateral upper extremity supported;Feet supported Sitting balance-Leahy Scale: Poor Postural control: Posterior lean Standing balance support:  Bilateral upper extremity supported Standing balance-Leahy Scale: Poor Standing balance comment:                            ADL either performed or assessed with clinical judgement   ADL Overall ADL's : Needs assistance/impaired Eating/Feeding: Set up   Grooming: Oral care;Wash/dry face;Wash/dry hands;Set up;Supervision/safety;Sitting   Upper Body Bathing: Set up;Supervision/ safety;Sitting   Lower Body Bathing: Moderate assistance;Sit to/from stand   Upper Body Dressing : Minimal assistance;Sitting   Lower Body Dressing: Maximal assistance;Sit to/from stand   Toilet Transfer: +2 for physical assistance;Moderate assistance   Toileting- Clothing Manipulation and Hygiene: Maximal assistance       Functional mobility during ADLs: +2 for physical assistance;Moderate assistance       Vision         Perception     Praxis      Pertinent Vitals/Pain       Hand Dominance Right   Extremity/Trunk Assessment Upper Extremity Assessment Upper Extremity Assessment: Generalized weakness   Lower Extremity Assessment Lower Extremity Assessment: Defer to PT evaluation   Cervical / Trunk Assessment Cervical / Trunk Assessment: Kyphotic   Communication Communication Communication: No difficulties   Cognition Arousal/Alertness: Awake/alert Behavior During Therapy: Anxious Overall Cognitive Status: Impaired/Different from baseline Area of Impairment: Attention;Memory;Safety/judgement;Awareness;Problem solving                   Current Attention Level: Sustained Memory: Decreased short-term memory   Safety/Judgement: Decreased awareness of safety;Decreased awareness of deficits Awareness: Emergent Problem Solving: Slow processing;Decreased initiation;Difficulty sequencing;Requires verbal cues;Requires tactile cues General  Comments: Pt requiring cues to complete tasks due to being distracted   General Comments       Exercises     Shoulder  Instructions      Home Living Family/patient expects to be discharged to:: Assisted living (independent living facility; [facility])                             Home Equipment: Gilford Rile - 4 wheels;Shower seat;Hospital bed;Wheelchair - manual   Additional Comments: Pt with poor memory of equipment and set-up. Pt reports having a walk-in shower and lives in an apartment with her husband. They have an aide do the cleaning. They do not cook.      Prior Functioning/Environment Level of Independence: Independent with assistive device(s)        Comments: Pt walks with RW, but poor historian as poor memory this date.        OT Problem List: Decreased strength;Decreased activity tolerance;Impaired balance (sitting and/or standing);Decreased cognition;Decreased safety awareness;Decreased knowledge of use of DME or AE;Cardiopulmonary status limiting activity;Obesity;Pain      OT Treatment/Interventions: Self-care/ADL training;Therapeutic exercise;Neuromuscular education;Energy conservation;DME and/or AE instruction;Therapeutic activities;Cognitive remediation/compensation;Patient/family education;Balance training    OT Goals(Current goals can be found in the care plan section) Acute Rehab OT Goals Patient Stated Goal: to get stronger OT Goal Formulation: Patient unable to participate in goal setting Time For Goal Achievement: 11/08/20 Potential to Achieve Goals: Good  OT Frequency: Min 2X/week   Barriers to D/C:            Co-evaluation              AM-PAC OT "6 Clicks" Daily Activity     Outcome Measure Help from another person eating meals?: A Little Help from another person taking care of personal grooming?: A Little Help from another person toileting, which includes using toliet, bedpan, or urinal?: A Lot Help from another person bathing (including washing, rinsing, drying)?: A Lot Help from another person to put on and taking off regular upper body clothing?: A  Little Help from another person to put on and taking off regular lower body clothing?: A Lot 6 Click Score: 15   End of Session Equipment Utilized During Treatment: Gait belt Nurse Communication: Mobility status  Activity Tolerance: Patient tolerated treatment well Patient left: in chair;with call bell/phone within reach;with chair alarm set  OT Visit Diagnosis: Unsteadiness on feet (R26.81);Other abnormalities of gait and mobility (R26.89);Muscle weakness (generalized) (M62.81);Other symptoms and signs involving cognitive function;Pain Pain - Right/Left:  (B) Pain - part of body: Ankle and joints of foot (toes)                Time: 1004-1030 OT Time Calculation (min): 26 min Charges:  OT General Charges $OT Visit: 1 Visit OT Evaluation $OT Eval Moderate Complexity: [place], OT/L   Acute OT Clinical Specialist Acute Rehabilitation Services Pager [phone] Office [phone]   [facility] 10/25/2020, 12:52 PM

## 2020-10-25 NOTE — TOC Progression Note (Addendum)
Transition of Care [facility]) - Progression Note    Patient Details  Name: Sheila [name] MRN: [id number] Date of Birth: 03-28-1946  [facility] CM/SW [place], LCSW Phone Number: 10/25/2020, 10:53 AM  Clinical Narrative:    CSW followed up with [place], [place]. She reported patient resides in their [place] with her husband receiving therapy through [facility]. She reported patient can continue that even with COVID.  CSW spoke with patient's spouse to ensure he is aware of patient's mobility status and increased care needs. He stated that he will be able to help the patient and he would rather her return home than to SNF. CSW offered to update additional family members but spouse declined.    Expected Discharge Plan: [place] Barriers to Discharge: Continued Medical Work up  Expected Discharge Plan and Services Expected Discharge Plan: [place] In-house Referral: Clinical Social Work   Post Acute Care Choice: Resumption of Svcs/PTA Provider Living arrangements for the past 2 months: [place]                                       Social Determinants of Health (SDOH) Interventions    Readmission Risk Interventions Readmission Risk Prevention Plan 05/15/2019  Post Dischage Appt Complete  Medication Screening Complete  Transportation Screening Complete

## 2020-10-26 DIAGNOSIS — K7581 Nonalcoholic steatohepatitis (NASH): Secondary | ICD-10-CM

## 2020-10-26 DIAGNOSIS — R339 Retention of urine, unspecified: Secondary | ICD-10-CM

## 2020-10-26 DIAGNOSIS — I5030 Unspecified diastolic (congestive) heart failure: Secondary | ICD-10-CM

## 2020-10-26 DIAGNOSIS — K7290 Hepatic failure, unspecified without coma: Secondary | ICD-10-CM | POA: Diagnosis not present

## 2020-10-26 DIAGNOSIS — I110 Hypertensive heart disease with heart failure: Secondary | ICD-10-CM

## 2020-10-26 DIAGNOSIS — D509 Iron deficiency anemia, unspecified: Secondary | ICD-10-CM

## 2020-10-26 DIAGNOSIS — K7469 Other cirrhosis of liver: Secondary | ICD-10-CM

## 2020-10-26 LAB — COMPREHENSIVE METABOLIC PANEL
ALT: 24 U/L (ref 0–44)
AST: 45 U/L — ABNORMAL HIGH (ref 15–41)
Albumin: 2.6 g/dL — ABNORMAL LOW (ref 3.5–5.0)
Alkaline Phosphatase: 82 U/L (ref 38–126)
Anion gap: 8 (ref 5–15)
BUN: 12 mg/dL (ref 8–23)
CO2: 20 mmol/L — ABNORMAL LOW (ref 22–32)
Calcium: 9.3 mg/dL (ref 8.9–10.3)
Chloride: 110 mmol/L (ref 98–111)
Creatinine, Ser: 0.9 mg/dL (ref 0.44–1.00)
GFR, Estimated: >60 mL/min (ref >60)
Glucose, Bld: 109 mg/dL — ABNORMAL HIGH (ref 70–99)
Potassium: 3.8 mmol/L (ref 3.5–5.1)
Sodium: 138 mmol/L (ref 135–145)
Total Bilirubin: 1.2 mg/dL (ref 0.3–1.2)
Total Protein: 6.5 g/dL (ref 6.5–8.1)

## 2020-10-26 LAB — CBC
HCT: 28.8 % — ABNORMAL LOW (ref 36.0–46.0)
Hemoglobin: 9.5 g/dL — ABNORMAL LOW (ref 12.0–15.0)
MCH: 27.7 pg (ref 26.0–34.0)
MCHC: 33 g/dL (ref 30.0–36.0)
MCV: 84 fL (ref 80.0–100.0)
Platelets: 157 10*3/uL (ref 150–400)
RBC: 3.43 MIL/uL — ABNORMAL LOW (ref 3.87–5.11)
RDW: 18.9 % — ABNORMAL HIGH (ref 11.5–15.5)
WBC: 4.8 10*3/uL (ref 4.0–10.5)
nRBC: 0 % (ref 0.0–0.2)

## 2020-10-26 LAB — GLUCOSE, CAPILLARY
Glucose-Capillary: 84 mg/dL (ref 70–99)
Glucose-Capillary: 127 mg/dL — ABNORMAL HIGH (ref 70–99)
Glucose-Capillary: 111 mg/dL — ABNORMAL HIGH (ref 70–99)
Glucose-Capillary: 113 mg/dL — ABNORMAL HIGH (ref 70–99)

## 2020-10-26 MED ORDER — CHLORHEXIDINE GLUCONATE CLOTH 2 % EX PADS
6.0000 | MEDICATED_PAD | Freq: Every day | CUTANEOUS | Status: DC
  Administered 2020-10-26 – 2020-10-28 (×3): 6 via TOPICAL

## 2020-10-26 MED ORDER — RIFAXIMIN 200 MG PO TABS
200.0000 mg | ORAL_TABLET | Freq: Two times a day (BID) | ORAL | Status: DC
  Administered 2020-10-26 – 2020-11-02 (×15): 200 mg via ORAL
  Filled 2020-10-26 (×15): qty 1

## 2020-10-26 MED ORDER — NADOLOL 20 MG PO TABS
20.0000 mg | ORAL_TABLET | Freq: Every day | ORAL | Status: DC
  Administered 2020-10-27: 20 mg via ORAL
  Filled 2020-10-26 (×2): qty 1

## 2020-10-26 MED ORDER — LACTULOSE 10 GM/15ML PO SOLN
60.0000 g | Freq: Three times a day (TID) | ORAL | Status: DC
  Administered 2020-10-26: 60 g via ORAL
  Filled 2020-10-26 (×3): qty 90

## 2020-10-26 NOTE — Progress Notes (Signed)
Bladder scan this AM - 378 cc urine retention. MD made aware

## 2020-10-26 NOTE — Progress Notes (Signed)
HD#4 Subjective:  Overnight Events: no event  She is seen at bedside today.  She is somnolent but awake to verbal stimulation.   States she is tired.   Objective:  Vital signs in last 24 hours: Vitals:   10/25/20 1601 10/25/20 2031 10/26/20 0439 10/26/20 0800  BP: (!) 163/67 (!) 160/67 (!) 160/69 (!) 162/62  Pulse: (!) 57 64 (!) 58 (!) 58  Resp: 20 16 14 16   Temp: 99.3 F (37.4 C) 99 F (37.2 C) 98.2 F (36.8 C) 98.7 F (37.1 C)  TempSrc: Oral Axillary Axillary Axillary  SpO2: 97% 96% 95% 95%  Weight:   82.1 kg   Height:       Supplemental O2: Room Air SpO2: 95 %   Physical Exam:  Physical Exam Constitutional:      General: She is not in acute distress.    Comments: Somnolent but awake to verbal stimulation.  Not able to hold a conversation  HENT:     Head: Normocephalic.  Eyes:     General:        Right eye: No discharge.        Left eye: No discharge.  Cardiovascular:     Rate and Rhythm: Regular rhythm. Bradycardia present.  Pulmonary:     Effort: Pulmonary effort is normal. No respiratory distress.  Abdominal:     General: Bowel sounds are normal.     Tenderness: There is abdominal tenderness (Tenderness to palpation of right upper quadrant). There is guarding.  Musculoskeletal:     Right lower leg: Edema (+1) present.     Left lower leg: Edema (+1) present.  Skin:    General: Skin is warm.     Filed Weights   10/24/20 0500 10/25/20 0417 10/26/20 0439  Weight: 80.7 kg 81.2 kg 82.1 kg     Intake/Output Summary (Last 24 hours) at 10/26/2020 0808 Last data filed at 10/26/2020 0547 Gross per 24 hour  Intake 487 ml  Output 1100 ml  Net -613 ml   Net IO Since Admission: -1,562.33 mL [10/26/20 0808]  Pertinent Labs: CBC Latest Ref Rng & Units 10/26/2020 10/25/2020 10/24/2020  WBC 4.0 - 10.5 K/uL 4.8 4.9 5.7  Hemoglobin 12.0 - 15.0 g/dL 9.5(L) 9.2(L) 9.1(L)  Hematocrit 36.0 - 46.0 % 28.8(L) 29.0(L) 29.0(L)  Platelets 150 - 400 K/uL 157 144(L) 188     CMP Latest Ref Rng & Units 10/26/2020 10/25/2020 10/24/2020  Glucose 70 - 99 mg/dL 109(H) 97 86  BUN 8 - 23 mg/dL 12 10 12   Creatinine 0.44 - 1.00 mg/dL 0.90 0.96 1.08(H)  Sodium 135 - 145 mmol/L 138 139 139  Potassium 3.5 - 5.1 mmol/L 3.8 3.3(L) 3.5  Chloride 98 - 111 mmol/L 110 109 107  CO2 22 - 32 mmol/L 20(L) 22 23  Calcium 8.9 - 10.3 mg/dL 9.3 9.0 9.1  Total Protein 6.5 - 8.1 g/dL 6.5 6.2(L) 6.3(L)  Total Bilirubin 0.3 - 1.2 mg/dL 1.2 1.2 1.2  Alkaline Phos 38 - 126 U/L 82 79 82  AST 15 - 41 U/L 45(H) 37 33  ALT 0 - 44 U/L 24 20 19     Imaging: No results found.  Assessment/Plan:   Principal Problem:   Encephalopathy, hepatic (HCC) Active Problems:   Liver cirrhosis (HCC)   Essential hypertension   Hypokalemia   COVID-19 virus infection   Type 2 diabetes mellitus ([place])   Patient Summary: Sheila [name] a 75 year old woman with past medical history of NASH cirrhosis with hepatic  encephalopathy,NSTEMI,PVC,hypertension, type 2 diabetes mellitus, CKD stage 3a,overactive bladder, bipolar disorder, vitamin B12 deficiencywho presentedto the [facility] via EMS from [place] with a 1-day history of confusion and admitted due to concern for hepatic encephalopathy.   Encephalopathy secondary to suspected hepatic encephalopathy Cirrhosis secondary to non-alcoholic steatohepatitis Patient remains encephalopathic despite having a bowel movement yesterday.  Per RN, patient is able to take p.o. medications.  Will increase lactulose to 60 g 3 times daily.  We will also add rifaximin. -Increaselactulose60 gthree times daily. Titrate to 3-4 BMs per day - Start Rifaximin - Decrease nadolol to 20 mg due to bradycardia - PT/OT recommend SNF.  Her husband would like patient to go home.  Will discuss when her encephalopathy improves   Acute urinary retention Bladder scan last night showed 480 cc and I/O cath collected 600 cc.  Repeat UA not suggestive of UTI, will  follow urine culture.  Unsure the cause of her urinary retention, can possibly related to Myrbetriq.  We will hold Myrbetriq and place a Foley if continues to retain urine. - Pending urine culture - Hold Myrbetrig   Normocytic anemia Vitamin B12 deficiency Iron deficiency anemia Patient received 1 does of Feraheme yesterday. Can give an additional does in 3 days.  Patient will need an outpatient follow-up with GI for EGD and colonoscopy for her iron deficiency is anemia - AM CBC   Hypertension BP fluctuate. Will continue current regimen  - Hold HCTZ and Irbesartan due to history of cirrhosis - Continue home hydralazine 25 mg q6h - Continue Spironolactone to 100 mg daily    Heart failure with preserved ejection fraction History of NSTEMI History ofPVCson Metoprolol Patient had 1100 cc of urine output yesterday. Her LE edema improved.  -PO Lasix 40 mg daily - strict I/O, daily weights - Holding home metoprolol    COVID-19 infection No indication for remdesivir or steroids at this time.    Hyperlipidemia - Continue home ezetimibe 10 mg daily   Type 2 diabetes mellitus Last A1c 5.3 on 1/24/22which can be falsely low.  - SSI moderate - Monitor CBGs - Will hold metformin after discharge in the setting of cirrhosis.   Bipolar disorder - Continue home aripiprazole 10 mg QHS - Continue home fluoxetine 40 mg daily - Continue bupropion 300 mg daily - Holdinggabapentinin setting of confusion   Diet:Carb-Modified YHC:[id number] VVO:HYWV Code:Full  Prior to Admission Living [place] at [facility] Anticipated Discharge Location:TBD Barriers to Discharge:further evaluation and treatment of encephalopathy  Gaylan Gerold, DO 10/26/2020, 8:08 AM Pager: [phone]  Please contact the on call pager after 5 pm and on weekends at [phone].

## 2020-10-26 NOTE — Progress Notes (Signed)
At 14:00 o'clock bladder scan - 973 ml urine retention. Per MD order a Foley cath 14 Fr was placed. 950 cc yellow cloudy urine returned. Patient tolerated well. Will continue to monitor.

## 2020-10-27 DIAGNOSIS — K7290 Hepatic failure, unspecified without coma: Secondary | ICD-10-CM | POA: Diagnosis not present

## 2020-10-27 DIAGNOSIS — R339 Retention of urine, unspecified: Secondary | ICD-10-CM | POA: Diagnosis not present

## 2020-10-27 DIAGNOSIS — K7581 Nonalcoholic steatohepatitis (NASH): Secondary | ICD-10-CM | POA: Diagnosis not present

## 2020-10-27 DIAGNOSIS — R600 Localized edema: Secondary | ICD-10-CM

## 2020-10-27 DIAGNOSIS — K7469 Other cirrhosis of liver: Secondary | ICD-10-CM | POA: Diagnosis not present

## 2020-10-27 LAB — BASIC METABOLIC PANEL
Anion gap: 9 (ref 5–15)
BUN: 13 mg/dL (ref 8–23)
CO2: 19 mmol/L — ABNORMAL LOW (ref 22–32)
Calcium: 9.2 mg/dL (ref 8.9–10.3)
Chloride: 107 mmol/L (ref 98–111)
Creatinine, Ser: 0.83 mg/dL (ref 0.44–1.00)
GFR, Estimated: >60 mL/min (ref >60)
Glucose, Bld: 97 mg/dL (ref 70–99)
Potassium: 3.9 mmol/L (ref 3.5–5.1)
Sodium: 135 mmol/L (ref 135–145)

## 2020-10-27 LAB — CBC
HCT: 29.6 % — ABNORMAL LOW (ref 36.0–46.0)
Hemoglobin: 9.8 g/dL — ABNORMAL LOW (ref 12.0–15.0)
MCH: 27.8 pg (ref 26.0–34.0)
MCHC: 33.1 g/dL (ref 30.0–36.0)
MCV: 84.1 fL (ref 80.0–100.0)
Platelets: 176 10*3/uL (ref 150–400)
RBC: 3.52 MIL/uL — ABNORMAL LOW (ref 3.87–5.11)
RDW: 19.1 % — ABNORMAL HIGH (ref 11.5–15.5)
WBC: 4.5 10*3/uL (ref 4.0–10.5)
nRBC: 0.4 % — ABNORMAL HIGH (ref 0.0–0.2)

## 2020-10-27 LAB — GLUCOSE, CAPILLARY
Glucose-Capillary: 101 mg/dL — ABNORMAL HIGH (ref 70–99)
Glucose-Capillary: 99 mg/dL (ref 70–99)
Glucose-Capillary: 132 mg/dL — ABNORMAL HIGH (ref 70–99)
Glucose-Capillary: 99 mg/dL (ref 70–99)

## 2020-10-27 MED ORDER — IBUPROFEN 400 MG PO TABS
200.0000 mg | ORAL_TABLET | ORAL | Status: DC | PRN
  Administered 2020-10-27 – 2020-10-28 (×2): 200 mg via ORAL
  Filled 2020-10-27 (×2): qty 1

## 2020-10-27 MED ORDER — LACTULOSE 10 GM/15ML PO SOLN
30.0000 g | ORAL | Status: DC
  Administered 2020-10-27 (×5): 30 g via ORAL
  Filled 2020-10-27 (×5): qty 45

## 2020-10-27 MED ORDER — LACTULOSE 10 GM/15ML PO SOLN
30.0000 g | Freq: Four times a day (QID) | ORAL | Status: DC
  Administered 2020-10-27 – 2020-10-29 (×5): 30 g via ORAL
  Filled 2020-10-27 (×5): qty 45

## 2020-10-27 MED ORDER — SODIUM CHLORIDE 0.9 % IV SOLN
510.0000 mg | Freq: Once | INTRAVENOUS | Status: AC
  Administered 2020-10-27: 510 mg via INTRAVENOUS
  Filled 2020-10-27: qty 17

## 2020-10-27 MED ORDER — GUAIFENESIN 100 MG/5ML PO SOLN
15.0000 mL | Freq: Four times a day (QID) | ORAL | Status: DC
  Administered 2020-10-28 – 2020-10-29 (×7): 300 mg
  Filled 2020-10-27 (×5): qty 15
  Filled 2020-10-27: qty 10
  Filled 2020-10-27: qty 5
  Filled 2020-10-27: qty 10

## 2020-10-27 NOTE — Progress Notes (Signed)
HD#5 Subjective:  Overnight Events: no acute event  Patient is working with PT during examination.  She appears more alert and awake compared to yesterday.  Patient is still encephalopathic but able to follow simple commands and smiling today.  She had 2 small bowel movements yesterday per RN.  Objective:  Vital signs in last 24 hours: Vitals:   10/26/20 1151 10/26/20 1234 10/26/20 2033 10/27/20 0443  BP: (!) 168/70 139/66 (!) 144/55 (!) 146/55  Pulse: (!) 59  (!) 56 (!) 55  Resp: 18  18 20   Temp: (!) 97.5 F (36.4 C)  98.2 F (36.8 C) 97.7 F (36.5 C)  TempSrc: Axillary  Axillary Axillary  SpO2: 97%  96% 96%  Weight:    80.4 kg  Height:       Supplemental O2: Room Air SpO2: 96 %   Physical Exam:  Physical Exam Constitutional:      Comments: More alert and awake today.  Able to follow simple command.  Still encephalopathic  HENT:     Head: Normocephalic.  Eyes:     General:        Right eye: No discharge.        Left eye: No discharge.  Pulmonary:     Effort: Pulmonary effort is normal. No respiratory distress.  Musculoskeletal:        General: Tenderness (Patient appears uncomfortable with right big toe palpation) present.     Cervical back: Normal range of motion.     Right lower leg: Edema present.     Left lower leg: Edema present.  Skin:    General: Skin is warm.     Filed Weights   10/25/20 0417 10/26/20 0439 10/27/20 0443  Weight: 81.2 kg 82.1 kg 80.4 kg     Intake/Output Summary (Last 24 hours) at 10/27/2020 0650 Last data filed at 10/27/2020 0551 Gross per 24 hour  Intake 900 ml  Output 1250 ml  Net -350 ml   Net IO Since Admission: -1,912.33 mL [10/27/20 0650]  Pertinent Labs: CBC Latest Ref Rng & Units 10/27/2020 10/26/2020 10/25/2020  WBC 4.0 - 10.5 K/uL 4.5 4.8 4.9  Hemoglobin 12.0 - 15.0 g/dL 9.8(L) 9.5(L) 9.2(L)  Hematocrit 36.0 - 46.0 % 29.6(L) 28.8(L) 29.0(L)  Platelets 150 - 400 K/uL 176 157 144(L)    CMP Latest Ref Rng & Units  10/27/2020 10/26/2020 10/25/2020  Glucose 70 - 99 mg/dL 97 109(H) 97  BUN 8 - 23 mg/dL 13 12 10   Creatinine 0.44 - 1.00 mg/dL 0.83 0.90 0.96  Sodium 135 - 145 mmol/L 135 138 139  Potassium 3.5 - 5.1 mmol/L 3.9 3.8 3.3(L)  Chloride 98 - 111 mmol/L 107 110 109  CO2 22 - 32 mmol/L 19(L) 20(L) 22  Calcium 8.9 - 10.3 mg/dL 9.2 9.3 9.0  Total Protein 6.5 - 8.1 g/dL - 6.5 6.2(L)  Total Bilirubin 0.3 - 1.2 mg/dL - 1.2 1.2  Alkaline Phos 38 - 126 U/L - 82 79  AST 15 - 41 U/L - 45(H) 37  ALT 0 - 44 U/L - 24 20    Imaging: No results found.  Assessment/Plan:   Principal Problem:   Encephalopathy, hepatic (HCC) Active Problems:   Liver cirrhosis (HCC)   Essential hypertension   Hypokalemia   COVID-19 virus infection   Type 2 diabetes mellitus ([place])   Patient Summary: Sheila [name] a 75 year old woman with past medical history of NASH cirrhosis with hepatic encephalopathy,NSTEMI,PVC,hypertension, type 2 diabetes mellitus, CKD stage 3a,overactive bladder,  bipolar disorder, vitamin B12 deficiencywho presentedto the [facility] via EMS from [place] with a 1-day history of confusion and admitted due to concern for hepatic encephalopathy.   Encephalopathy secondary to suspected hepatic encephalopathy Cirrhosis secondary to non-alcoholic steatohepatitis Patient still encephalopathic with some improvement today.  She is able to follow simple commands and is more interactive.  She had to have medium bowel vomit yesterday.  Will continue lactulose 30 g q2h and titrate to have 2-3 BM a day.  Also continue rifaximin -Lactulose30 gq2h. Titrate to 2-3 BMs per day - Continue rifaximin - Continue nadolol 20 mg.  If worsened bradycardia, will discontinue. - PT/OTrecommend SNF.  Will discuss when her encephalopathy improves   Acute urinary retention Continue Foley care.  Urine culture reintubated for better growth, will continue to follow, treatment if appropriate. - Pending urine  culture - Hold Myrbetrig   Normocytic anemia Vitamin B12 deficiency Iron deficiency anemia Give additional dose of Feraheme today. Patient will need an outpatient follow-up with GI for EGD and colonoscopy for her iron deficiency is anemia - AM CBC   Hypertension BP fluctuate. Will continue current regimen  - Hold HCTZ and Irbesartan due to history of cirrhosis - Continue home hydralazine 25 mg q6h -ContinueSpironolactone to 100 mg daily    Heart failure with preserved ejection fraction History of NSTEMI History ofPVCson Metoprolol Patient had 1250 cc of urine output yesterday.  -PO Lasix 40 mg daily - strict I/O, daily weights - Holding home metoprolol    COVID-19 infection No indication for remdesivir or steroids at this time.    Hyperlipidemia - Continue home ezetimibe 10 mg daily   Type 2 diabetes mellitus Last A1c 5.3 on 1/24/22which can be falsely low.  - SSI moderate - Monitor CBGs -Willhold metformin after discharge in the setting of cirrhosis.   Bipolar disorder - Continue home aripiprazole 10 mg QHS - Continue home fluoxetine 40 mg daily - Continue bupropion 300 mg daily - Holdinggabapentinin setting of confusion   Diet:Carb-Modified NID:[id number] WER:XVQM Code:Full  Prior to Admission Living [place] at [facility] Anticipated Discharge Location:TBD Barriers to Discharge:further evaluation and treatment of encephalopathy  Gaylan Gerold, DO 10/27/2020, 6:50 AM Pager: [phone]  Please contact the on call pager after 5 pm and on weekends at [phone].

## 2020-10-27 NOTE — Progress Notes (Signed)
Physical Therapy Treatment Patient Details Name: Sheila [name] MRN: [id number] DOB: 11/07/1945 Today's Date: 10/27/2020    History of Present Illness Pt is a 75 y.o. female admitted from [place] on 10/22/20 with AMS and cough; incidental (+) COVID-19. [place] for hepatic encephalopathy from missing lactulose meds for at least a week. PMH includes [name] cirrhosis, hepatic encephalopathy, CAD, HTN, DM2, CKD 3, vulva CA, bipolar disorder.    PT Comments    Continuing work on functional mobility and activity tolerance;  Session focused on bed mobility, transfer OOB to recliner with contining assessment of cognition; Noting Pt was much more awake and able to interact once sitting upright at EOB; Followed predominance of simple commands consistently, though needing extra time; Noted also attempts to verbalize today, making "K" sound at end of session, then smiled broadly when presented with her Carmex lip ointment that was on her table; It is this therapist's first time working with Ms. Gangwer, so I did not have a basis of comparison for her cognitive status, but the docs of Internal Medicine seemed pleased with her interactions/following commands  Follow Up Recommendations  SNF;Supervision for mobility/OOB     Equipment Recommendations  Rolling walker with 5" wheels;3in1 (PT);Wheelchair (measurements PT);Wheelchair cushion (measurements PT)    Recommendations for Other Services       Precautions / Restrictions Precautions Precautions: Fall;Other (comment) Precaution Comments: Painful toes    Mobility  Bed Mobility Overal bed mobility: Needs Assistance Bed Mobility: Rolling;Sidelying to Sit Rolling: Max assist Sidelying to sit: Max assist;+2 for physical assistance       General bed mobility comments: Quite sleepy initially and difficult to arouse; Needed 2 person Max assist to roll and elevate trunk to sit    Transfers Overall transfer level: Needs assistance Equipment used:  2 person hand held assist Transfers: Sit to/from [facility] Sit to Stand: Mod assist Stand pivot transfers: Mod assist       General transfer comment: Heavy ModA+2 to assist trunk elevation with bed pad and bilateral HHA; blocked L knee for stability; pt able to achieve fully upright posture and take pivotal steps to recliner; posterior lean  Ambulation/Gait                 Stairs             Wheelchair Mobility    Modified Rankin (Stroke Patients Only)       Balance     Sitting balance-Leahy Scale: Fair Sitting balance - Comments: Initial modA for sitting balance, progressing to min guard without UE support     Standing balance-Leahy Scale: Poor                              Cognition Arousal/Alertness: Awake/alert (though sleepy; eyes closed approx 30% of session; more open and awake once sitting upright) Behavior During Therapy: WFL for tasks assessed/performed Overall Cognitive Status: Impaired/Different from baseline Area of Impairment: Following commands;Attention;Problem solving                       Following Commands: Follows one step commands with increased time     Problem Solving: Slow processing;Decreased initiation;Difficulty sequencing;Requires verbal cues;Requires tactile cues General Comments: Made attempts to say words, but mostly able to only get sounds; this PT was unable to make out any appreciable words this session; still, she did consistently follow commands with incr time, and able to communicate with  facial expression; Smiled when this PT offered to sing her favorite song, or when presented with Carmex lip ointment, or when she heard her husband's voice on the phone      Exercises      General Comments General comments (skin integrity, edema, etc.): HR 63, O2 sats 98% on Room Air, BP 136/61      Pertinent Vitals/Pain Pain Assessment: Faces Faces Pain Scale: Hurts little more Pain  Location: R-side toes, L ankle with palpation Pain Descriptors / Indicators: Discomfort Pain Intervention(s): Monitored during session;Repositioned;Other (comment) (Passed on info to Internal Medicine team)    Home Living                      Prior Function            PT Goals (current goals can now be found in the care plan section) Acute Rehab PT Goals Patient Stated Goal: unable to state; was vocalizing "K" sound at end of session, and smiled when this PT held up her Carmex and offered it to her PT Goal Formulation: With patient Time For Goal Achievement: 11/07/20 Potential to Achieve Goals: Fair Progress towards PT goals: Progressing toward goals    Frequency    Min 3X/week      PT Plan Current plan remains appropriate    Co-evaluation              AM-PAC PT "6 Clicks" Mobility   Outcome Measure  Help needed turning from your back to your side while in a flat bed without using bedrails?: A Lot Help needed moving from lying on your back to sitting on the side of a flat bed without using bedrails?: A Lot Help needed moving to and from a bed to a chair (including a wheelchair)?: A Lot Help needed standing up from a chair using your arms (e.g., wheelchair or bedside chair)?: A Lot Help needed to walk in hospital room?: A Lot Help needed climbing 3-5 steps with a railing? : Total 6 Click Score: 11    End of Session Equipment Utilized During Treatment: Gait belt Activity Tolerance: Patient tolerated treatment well Patient left: in chair;with call bell/phone within reach;with chair alarm set Nurse Communication: Mobility status;Other (comment) (and husband, Emaan Gary, requests information) PT Visit Diagnosis: Unsteadiness on feet (R26.81);Other abnormalities of gait and mobility (R26.89);Muscle weakness (generalized) (M62.81);Difficulty in walking, not elsewhere classified (R26.2)     Time: 9147-8295 PT Time Calculation (min) (ACUTE ONLY): 26  min  Charges:  $Therapeutic Activity: 23-37 mins                     Roney Marion, [place]  Acute Rehabilitation Services Pager [phone] Office [place] 10/27/2020, 12:04 PM

## 2020-10-28 DIAGNOSIS — R001 Bradycardia, unspecified: Secondary | ICD-10-CM

## 2020-10-28 DIAGNOSIS — K7581 Nonalcoholic steatohepatitis (NASH): Secondary | ICD-10-CM | POA: Diagnosis not present

## 2020-10-28 DIAGNOSIS — R339 Retention of urine, unspecified: Secondary | ICD-10-CM | POA: Diagnosis not present

## 2020-10-28 DIAGNOSIS — K7290 Hepatic failure, unspecified without coma: Secondary | ICD-10-CM | POA: Diagnosis not present

## 2020-10-28 DIAGNOSIS — K7469 Other cirrhosis of liver: Secondary | ICD-10-CM | POA: Diagnosis not present

## 2020-10-28 LAB — BASIC METABOLIC PANEL
Anion gap: 8 (ref 5–15)
BUN: 15 mg/dL (ref 8–23)
CO2: 20 mmol/L — ABNORMAL LOW (ref 22–32)
Calcium: 9.3 mg/dL (ref 8.9–10.3)
Chloride: 107 mmol/L (ref 98–111)
Creatinine, Ser: 0.93 mg/dL (ref 0.44–1.00)
GFR, Estimated: >60 mL/min (ref >60)
Glucose, Bld: 75 mg/dL (ref 70–99)
Potassium: 3.4 mmol/L — ABNORMAL LOW (ref 3.5–5.1)
Sodium: 135 mmol/L (ref 135–145)

## 2020-10-28 LAB — CBC
HCT: 30.2 % — ABNORMAL LOW (ref 36.0–46.0)
Hemoglobin: 9.6 g/dL — ABNORMAL LOW (ref 12.0–15.0)
MCH: 27.4 pg (ref 26.0–34.0)
MCHC: 31.8 g/dL (ref 30.0–36.0)
MCV: 86.3 fL (ref 80.0–100.0)
Platelets: 169 10*3/uL (ref 150–400)
RBC: 3.5 MIL/uL — ABNORMAL LOW (ref 3.87–5.11)
RDW: 19.1 % — ABNORMAL HIGH (ref 11.5–15.5)
WBC: 5.2 10*3/uL (ref 4.0–10.5)
nRBC: 0 % (ref 0.0–0.2)

## 2020-10-28 LAB — GLUCOSE, CAPILLARY
Glucose-Capillary: 74 mg/dL (ref 70–99)
Glucose-Capillary: 75 mg/dL (ref 70–99)
Glucose-Capillary: 99 mg/dL (ref 70–99)

## 2020-10-28 MED ORDER — POTASSIUM CHLORIDE 20 MEQ PO PACK
40.0000 meq | PACK | Freq: Two times a day (BID) | ORAL | Status: DC
  Administered 2020-10-28 – 2020-11-02 (×10): 40 meq via ORAL
  Filled 2020-10-28 (×11): qty 2

## 2020-10-28 NOTE — Progress Notes (Signed)
MD paged about pt lack of piv. Per IV team, require MD order to place IV. Pt has no IV meds currently. Per MD, ok to leave without IV access at this time.

## 2020-10-28 NOTE — Progress Notes (Signed)
HD#6 Subjective:  Overnight Events: no event  Patient had multiple BMs yesterday. When evaluate at bedside, patient's mental status appears much improved compared to yesterday. Patient is alert, awake and oriented. She is able to answer questions appropriately. She has no other complains.   Objective:  Vital signs in last 24 hours: Vitals:   10/27/20 2010 10/28/20 0019 10/28/20 0500 10/28/20 0525  BP:  (!) 158/62  (!) 160/60  Pulse: (!) 55 (!) 57  (!) 58  Resp: 20 18  14   Temp: 98.3 F (36.8 C)   98.4 F (36.9 C)  TempSrc: Oral   Oral  SpO2: 96% 95%  95%  Weight:   80.2 kg   Height:       Supplemental O2: Room Air SpO2: 95 % O2 Flow Rate (L/min): 0 L/min   Physical Exam:  Physical Exam Constitutional:      General: She is not in acute distress.    Comments: Much more alert, awake and oriented  Eyes:     General:        Right eye: No discharge.        Left eye: No discharge.  Cardiovascular:     Rate and Rhythm: Regular rhythm. Bradycardia present.  Pulmonary:     Effort: Pulmonary effort is normal. No respiratory distress.  Abdominal:     General: Bowel sounds are normal.     Tenderness: There is no abdominal tenderness.  Musculoskeletal:     Right lower leg: Edema (Trace) present.     Left lower leg: Edema (Trace) present.  Skin:    General: Skin is warm.  Psychiatric:        Mood and Affect: Mood normal.     Filed Weights   10/26/20 0439 10/27/20 0443 10/28/20 0500  Weight: 82.1 kg 80.4 kg 80.2 kg     Intake/Output Summary (Last 24 hours) at 10/28/2020 0657 Last data filed at 10/28/2020 0537 Gross per 24 hour  Intake 1120 ml  Output 1350 ml  Net -230 ml   Net IO Since Admission: -2,142.33 mL [10/28/20 0657]  Pertinent Labs: CBC Latest Ref Rng & Units 10/28/2020 10/27/2020 10/26/2020  WBC 4.0 - 10.5 K/uL 5.2 4.5 4.8  Hemoglobin 12.0 - 15.0 g/dL 9.6(L) 9.8(L) 9.5(L)  Hematocrit 36.0 - 46.0 % 30.2(L) 29.6(L) 28.8(L)  Platelets 150 - 400 K/uL 169  176 157    CMP Latest Ref Rng & Units 10/28/2020 10/27/2020 10/26/2020  Glucose 70 - 99 mg/dL 75 97 109(H)  BUN 8 - 23 mg/dL 15 13 12   Creatinine 0.44 - 1.00 mg/dL 0.93 0.83 0.90  Sodium 135 - 145 mmol/L 135 135 138  Potassium 3.5 - 5.1 mmol/L 3.4(L) 3.9 3.8  Chloride 98 - 111 mmol/L 107 107 110  CO2 22 - 32 mmol/L 20(L) 19(L) 20(L)  Calcium 8.9 - 10.3 mg/dL 9.3 9.2 9.3  Total Protein 6.5 - 8.1 g/dL - - 6.5  Total Bilirubin 0.3 - 1.2 mg/dL - - 1.2  Alkaline Phos 38 - 126 U/L - - 82  AST 15 - 41 U/L - - 45(H)  ALT 0 - 44 U/L - - 24    Imaging: No results found.  Assessment/Plan:   Principal Problem:   Encephalopathy, hepatic (HCC) Active Problems:   Liver cirrhosis (HCC)   Essential hypertension   Hypokalemia   COVID-19 virus infection   Type 2 diabetes mellitus ([place])   Patient Summary: Sheila [name] a 75 year old woman with past medical history of NASH  cirrhosis with hepatic encephalopathy,NSTEMI,PVC,hypertension, type 2 diabetes mellitus, CKD stage 3a,overactive bladder, bipolar disorder, vitamin B12 deficiencywho presentedto the [facility] via EMS from [place] with a 1-day history of confusion and admitted due to concern for hepatic encephalopathy. Improved with Lactulose.    Encephalopathy secondary to suspected hepatic encephalopathy Cirrhosis secondary to non-alcoholic steatohepatitis Patient's mental status improved significantly today.  She had multiple bowel movements yesterday with aggressive lactulose.  Will reduce lactulose to 30 g 4 times daily and titrate to 2-3 bowel moments per day.  Continue rifaximin, consult transitional care for rifaximin cost after discharge. -Lactulose30 gqid. Titrate to 2-3 BMs per day - Continue rifaximin -Discontinue nadolol due to bradycardia.  - PT/OTrecommend SNF.Will discuss when her encephalopathy improves   Acute urinary retention Continue Foley care.  Urine culture reintubated for better growth,  will continue to follow, treatment if appropriate. - Pending urine culture - Hold Myrbetrig - Will attempt voiding trial when mental status improves   Normocytic anemia Vitamin B12 deficiency Iron deficiency anemia Received Feraheme yesterday, Patient will need an outpatient follow-up with GI for EGD and colonoscopy for her iron deficiency is anemia - AM CBC   Hypertension BPfluctuate. Will continue current regimen - Hold HCTZ and Irbesartan due to history of cirrhosis - Continue home hydralazine 25 mg q6h -ContinueSpironolactone to 100 mg daily    Heart failure with preserved ejection fraction History of NSTEMI History ofPVCson Metoprolol Patient had 1350 cc of urine output yesterday.  -PO Lasix 40 mg daily - Spironolactone 100 mg daily  - strict I/O, daily weights - Holding home metoprolol    COVID-19 infection No indication for remdesivir or steroids at this time.    Hyperlipidemia - Continue home ezetimibe 10 mg daily   Type 2 diabetes mellitus Last A1c 5.3 on 1/24/22which can be falsely low.  - SSI moderate - Monitor CBGs -Willhold metformin after discharge in the setting of cirrhosis.   Bipolar disorder - Continue home aripiprazole 10 mg QHS - Continue home fluoxetine 40 mg daily - Continue bupropion 300 mg daily - Holdinggabapentinin setting of confusion   Diet:Carb-Modified GQQ:[id number] ZTI:WPYK Code:Full  Prior to Admission Living [place] at [facility] Anticipated Discharge Location:TBD Barriers to Discharge:further evaluation and treatment of encephalopathy  Gaylan Gerold, DO 10/28/2020, 6:57 AM Pager: [phone]  Please contact the on call pager after 5 pm and on weekends at [phone].

## 2020-10-28 NOTE — Care Management Important Message (Signed)
Important Message  Patient Details  Name: Sheila [name] MRN: [id number] Date of Birth: 1945/12/20   Medicare Important Message Given:  Yes - Important Message mailed due to current National Emergency   Verbal consent obtained due to current National Emergency  Relationship to patient: Self Contact Name: Britiney Blahnik Call Date: 10/28/20  Time: 1222 Phone: [phone] Outcome: No Answer/Busy Important Message mailed to: Patient address on file    Delorse Lek 10/28/2020, 12:22 PM

## 2020-10-28 NOTE — Progress Notes (Signed)
Occupational Therapy Treatment Patient Details Name: Sheila [name] MRN: [id number] DOB: Jul 21, 1946 Today's Date: 10/28/2020    History of present illness Pt is a 75 y.o. female admitted from [place] on 10/22/20 with AMS and cough; incidental (+) COVID-19. [place] for hepatic encephalopathy from missing lactulose meds for at least a week. PMH includes [name] cirrhosis, hepatic encephalopathy, CAD, HTN, DM2, CKD 3, vulva CA, bipolar disorder.   OT comments  Pt demonstrates improved cognition this date, but remains slow to process info, and requires cues to attend fully to task and for problem solving.  She is able to perform LB ADLs with mod A, and requires mod A to maintain standing >4 mins.  She demonstrates heavy posterior bias, and over shifts her weight anteriorly to correct which places her at significant risk for falls. Continue to recommend SNF.   Follow Up Recommendations  SNF    Equipment Recommendations  3 in 1 bedside commode    Recommendations for Other Services      Precautions / Restrictions Precautions Precautions: Fall;Other (comment)       Mobility Bed Mobility               General bed mobility comments: pt sitting up in recliner    Transfers Overall transfer level: Needs assistance Equipment used: Rolling walker (2 wheeled) Transfers: Sit to/from Stand Sit to Stand: Mod assist         General transfer comment: assist for foward excursion of trunk and to lift hips from recliner    Balance Overall balance assessment: Needs assistance Sitting-balance support: Feet supported Sitting balance-Leahy Scale: Fair     Standing balance support: Bilateral upper extremity supported Standing balance-Leahy Scale: Poor Standing balance comment: heavy reliance on UEs and mod A.  Pt with heavy posterior bias, but will overshift anteriorly in an attempt to correct                           ADL either performed or assessed with clinical judgement    ADL Overall ADL's : Needs assistance/impaired                     Lower Body Dressing: Sit to/from stand;Moderate assistance Lower Body Dressing Details (indicate cue type and reason): Pt is able to don/doff socks seated in recliner with supervision.  She requires mod A for standing with bil. UE support and unable to unweight UE to pull up pants                     Vision       Perception     Praxis      Cognition Arousal/Alertness: Awake/alert Behavior During Therapy: WFL for tasks assessed/performed Overall Cognitive Status: Impaired/Different from baseline Area of Impairment: Attention;Following commands;Problem solving                   Current Attention Level: Selective Memory: Decreased short-term memory Following Commands: Follows one step commands consistently     Problem Solving: Slow processing;Difficulty sequencing;Requires verbal cues General Comments: pt is slow to process.  She is more interactive, but requires cues for problem solving and safety        Exercises     Shoulder Instructions       General Comments VSS    Pertinent Vitals/ Pain       Pain Assessment: Faces Faces Pain Scale: No hurt  Home Living  Prior Functioning/Environment              Frequency  Min 2X/week        Progress Toward Goals  OT Goals(current goals can now be found in the care plan section)  Progress towards OT goals: Progressing toward goals     Plan Discharge plan remains appropriate    Co-evaluation                 AM-PAC OT "6 Clicks" Daily Activity     Outcome Measure   Help from another person eating meals?: A Little Help from another person taking care of personal grooming?: A Little Help from another person toileting, which includes using toliet, bedpan, or urinal?: A Lot Help from another person bathing (including washing, rinsing, drying)?: A  Lot Help from another person to put on and taking off regular upper body clothing?: A Little Help from another person to put on and taking off regular lower body clothing?: A Lot 6 Click Score: 15    End of Session Equipment Utilized During Treatment: Gait belt  OT Visit Diagnosis: Unsteadiness on feet (R26.81);Other abnormalities of gait and mobility (R26.89);Muscle weakness (generalized) (M62.81);Other symptoms and signs involving cognitive function;Pain   Activity Tolerance Patient tolerated treatment well   Patient Left in chair;with call bell/phone within reach   Nurse Communication Mobility status        Time: 2633-3545 OT Time Calculation (min): 17 min  Charges: OT Treatments $Therapeutic Activity: 8-22 mins  Nilsa Nutting., OTR/L Acute Rehabilitation Services Pager [phone] Office [place], [place] 10/28/2020, 1:21 PM

## 2020-10-29 DIAGNOSIS — K7581 Nonalcoholic steatohepatitis (NASH): Secondary | ICD-10-CM | POA: Diagnosis not present

## 2020-10-29 DIAGNOSIS — K7290 Hepatic failure, unspecified without coma: Secondary | ICD-10-CM | POA: Diagnosis not present

## 2020-10-29 DIAGNOSIS — R339 Retention of urine, unspecified: Secondary | ICD-10-CM | POA: Diagnosis not present

## 2020-10-29 DIAGNOSIS — K7469 Other cirrhosis of liver: Secondary | ICD-10-CM | POA: Diagnosis not present

## 2020-10-29 LAB — BASIC METABOLIC PANEL
Anion gap: 7 (ref 5–15)
BUN: 12 mg/dL (ref 8–23)
CO2: 18 mmol/L — ABNORMAL LOW (ref 22–32)
Calcium: 9.2 mg/dL (ref 8.9–10.3)
Chloride: 108 mmol/L (ref 98–111)
Creatinine, Ser: 0.94 mg/dL (ref 0.44–1.00)
GFR, Estimated: >60 mL/min (ref >60)
Glucose, Bld: 94 mg/dL (ref 70–99)
Potassium: 4.3 mmol/L (ref 3.5–5.1)
Sodium: 133 mmol/L — ABNORMAL LOW (ref 135–145)

## 2020-10-29 LAB — CBC
HCT: 32.2 % — ABNORMAL LOW (ref 36.0–46.0)
Hemoglobin: 10.1 g/dL — ABNORMAL LOW (ref 12.0–15.0)
MCH: 27.2 pg (ref 26.0–34.0)
MCHC: 31.4 g/dL (ref 30.0–36.0)
MCV: 86.6 fL (ref 80.0–100.0)
Platelets: 186 10*3/uL (ref 150–400)
RBC: 3.72 MIL/uL — ABNORMAL LOW (ref 3.87–5.11)
RDW: 19.7 % — ABNORMAL HIGH (ref 11.5–15.5)
WBC: 5.1 10*3/uL (ref 4.0–10.5)
nRBC: 0 % (ref 0.0–0.2)

## 2020-10-29 LAB — URINE CULTURE: Culture: >=100000 — AB

## 2020-10-29 MED ORDER — ACETAMINOPHEN 325 MG PO TABS
650.0000 mg | ORAL_TABLET | Freq: Four times a day (QID) | ORAL | Status: AC | PRN
  Administered 2020-10-29: 650 mg via ORAL
  Filled 2020-10-29: qty 2

## 2020-10-29 MED ORDER — GUAIFENESIN 100 MG/5ML PO SOLN
15.0000 mL | Freq: Four times a day (QID) | ORAL | Status: DC
  Administered 2020-10-29 – 2020-11-01 (×12): 300 mg via ORAL
  Filled 2020-10-29 (×2): qty 15
  Filled 2020-10-29: qty 5
  Filled 2020-10-29 (×6): qty 15
  Filled 2020-10-29: qty 5
  Filled 2020-10-29: qty 15
  Filled 2020-10-29: qty 10
  Filled 2020-10-29 (×4): qty 15

## 2020-10-29 MED ORDER — LACTULOSE 10 GM/15ML PO SOLN
30.0000 g | Freq: Three times a day (TID) | ORAL | Status: DC
  Administered 2020-10-29 – 2020-10-31 (×8): 30 g via ORAL
  Filled 2020-10-29 (×8): qty 45

## 2020-10-29 NOTE — Progress Notes (Signed)
Per Day RN pt has no IV access - MD team aware of this. Pt had no IV medications at this time.

## 2020-10-29 NOTE — Plan of Care (Signed)
  Problem: Education: Goal: Knowledge of General Education information will improve Description: Including pain rating scale, medication(s)/side effects and non-pharmacologic comfort measures Outcome: Progressing   Problem: Clinical Measurements: Goal: Ability to maintain clinical measurements within normal limits will improve Outcome: Progressing Goal: Diagnostic test results will improve Outcome: Progressing   Problem: Activity: Goal: Risk for activity intolerance will decrease Outcome: Progressing   Problem: Education: Goal: Knowledge of risk factors and measures for prevention of condition will improve Outcome: Progressing   Problem: Coping: Goal: Psychosocial and spiritual needs will be supported Outcome: Progressing   Problem: Respiratory: Goal: Complications related to the disease process, condition or treatment will be avoided or minimized Outcome: Progressing

## 2020-10-29 NOTE — Plan of Care (Signed)
Patient is currently resting in bed. C/o pain in right hip and knee. Given PRN medications. VSS. Remains on room air. OOB ambulating to bedside commode. Foley intact. Two BM overnight. Call bell within reach. Bed alarm on.   Problem: Education: Goal: Knowledge of General Education information will improve Description: Including pain rating scale, medication(s)/side effects and non-pharmacologic comfort measures Outcome: Progressing   Problem: Clinical Measurements: Goal: Ability to maintain clinical measurements within normal limits will improve Outcome: Progressing Goal: Diagnostic test results will improve Outcome: Progressing   Problem: Activity: Goal: Risk for activity intolerance will decrease Outcome: Progressing   Problem: Education: Goal: Knowledge of risk factors and measures for prevention of condition will improve Outcome: Progressing   Problem: Coping: Goal: Psychosocial and spiritual needs will be supported Outcome: Progressing   Problem: Respiratory: Goal: Complications related to the disease process, condition or treatment will be avoided or minimized Outcome: Progressing

## 2020-10-29 NOTE — TOC Benefit Eligibility Note (Signed)
Transition of Care [facility]) Benefit Eligibility Note    Patient Details  Name: Sheila [name] MRN: [id number] Date of Birth: August 31, 1946   Medication/Dose: RIFAXIMIN  200 MG BID  ( XIFAXAN 200 MG BID )  Covered?: Yes     Prescription Coverage Preferred Pharmacy: [facility]  and [name] with Person/Company/Phone Number:: DREY  @  [employer] # [phone]  Co-Pay: 33 % OF TOTAL COST  Prior Approval: Yes ([phone])          [name] Phone Number: 10/29/2020, 10:01 AM

## 2020-10-29 NOTE — Progress Notes (Signed)
Physical Therapy Treatment Patient Details Name: Sheila [name] MRN: [id number] DOB: 1946/06/04 Today's Date: 10/29/2020    History of Present Illness Pt is a 75 y.o. female admitted from [place] on 10/22/20 with AMS and cough; incidental (+) COVID-19. [place] for hepatic encephalopathy from missing lactulose meds for at least a week. PMH includes [name] cirrhosis, hepatic encephalopathy, CAD, HTN, DM2, CKD 3, vulva CA, bipolar disorder.   PT Comments    Pt's mentation continues to improve; still demonstrates decreased insight into deficits and slowed processing. Pt requires heavy modA to stand and take steps with RW; limited by generalized weakness and impaired balance, presenting with significant posterior lean and difficulty maintaining standing posture. Continue to recommend SNF-level therapies. If pt to return to ILF, will require increased assist from husband and staff.   SpO2 97% on RA, HR 73    Follow Up Recommendations  SNF;Supervision for mobility/OOB     Equipment Recommendations  Rolling walker with 5" wheels;3in1 (bariatric);Wheelchair and cushion (bariatric)   Recommendations for Other Services       Precautions / Restrictions Precautions Precautions: Fall;Other (comment) Precaution Comments: Painful toes; urinary incontinence Restrictions Weight Bearing Restrictions: No    Mobility  Bed Mobility Overal bed mobility: Needs Assistance Bed Mobility: Supine to Sit     Supine to sit: Supervision;HOB elevated          Transfers Overall transfer level: Needs assistance Equipment used: Rolling walker (2 wheeled) Transfers: Sit to/from Stand Sit to Stand: Mod assist         General transfer comment: Multiple sit<>stands from EOB, BSC and recliner to RW, pt requiring consistent HHA and modA to elevate trunk with significant posterior lean  Ambulation/Gait Ambulation/Gait assistance: Mod assist Gait Distance (Feet): 4 Feet Assistive device: Rolling  walker (2 wheeled) Gait Pattern/deviations: Step-to pattern;Trunk flexed;Leaning posteriorly Gait velocity: Decreased   General Gait Details: Pivotal steps from bed>BSC>recliner with RW and consistent modA to maintain balance due to posterior leans, assist for RW management and cues for sequencing   Stairs             Wheelchair Mobility    Modified Rankin (Stroke Patients Only)       Balance Overall balance assessment: Needs assistance Sitting-balance support: Feet supported Sitting balance-Leahy Scale: Fair Sitting balance - Comments: Pt able to brush teeth seated in recliner without back support   Standing balance support: Bilateral upper extremity supported Standing balance-Leahy Scale: Poor Standing balance comment: heavy reliance on UEs and mod A; heavy posterior bias; dependent for posterior pericare                            Cognition Arousal/Alertness: Awake/alert Behavior During Therapy: WFL for tasks assessed/performed Overall Cognitive Status: Impaired/Different from baseline Area of Impairment: Attention;Following commands;Problem solving;Memory;Safety/judgement;Awareness                   Current Attention Level: Selective Memory: Decreased short-term memory Following Commands: Follows one step commands consistently;Follows multi-step commands inconsistently Safety/Judgement: Decreased awareness of deficits Awareness: Emergent Problem Solving: Slow processing;Difficulty sequencing;Requires verbal cues General Comments: Much more appropriate, alert and interactive today. Still with slowed processing and requires cues to problem solve      Exercises General Exercises - Lower Extremity Ankle Circles/Pumps: AROM;Both;Seated Long Arc Quad: AROM;Both;Seated    General Comments General comments (skin integrity, edema, etc.): HR 73, SpO2 97% on RA      Pertinent Vitals/Pain Pain Assessment: Faces Faces  Pain Scale: Hurts a little  bit Pain Location: Bilateral feet Pain Descriptors / Indicators: Discomfort Pain Intervention(s): Monitored during session    Home Living                      Prior Function            PT Goals (current goals can now be found in the care plan section) Progress towards PT goals: Progressing toward goals    Frequency    Min 3X/week      PT Plan Current plan remains appropriate    Co-evaluation              AM-PAC PT "6 Clicks" Mobility   Outcome Measure  Help needed turning from your back to your side while in a flat bed without using bedrails?: A Little Help needed moving from lying on your back to sitting on the side of a flat bed without using bedrails?: A Little Help needed moving to and from a bed to a chair (including a wheelchair)?: A Lot Help needed standing up from a chair using your arms (e.g., wheelchair or bedside chair)?: A Lot Help needed to walk in hospital room?: A Lot Help needed climbing 3-5 steps with a railing? : Total 6 Click Score: 13    End of Session   Activity Tolerance: Patient tolerated treatment well Patient left: in chair;with call bell/phone within reach;with chair alarm set Nurse Communication: Mobility status PT Visit Diagnosis: Unsteadiness on feet (R26.81);Other abnormalities of gait and mobility (R26.89);Muscle weakness (generalized) (M62.81);Difficulty in walking, not elsewhere classified (R26.2)     Time: 3419-6222 PT Time Calculation (min) (ACUTE ONLY): 26 min  Charges:  $Therapeutic Activity: 23-37 mins                     Mabeline Caras, PT, DPT Acute Rehabilitation Services  Pager [phone] Office [place] 10/29/2020, 5:36 PM

## 2020-10-29 NOTE — Care Management (Signed)
9509 10-29-20 Benefits check submitted for Rifaximin. Case Manager will follow for cost. Graves-Bigelow, Ocie Cornfield, RN, BSN

## 2020-10-29 NOTE — Progress Notes (Signed)
HD#7 Subjective:  Overnight Events: no event  Patient is seen at bedside. She appears pleasant and in no acute distress. She is alert, awake, and oriented to person, time and place. Patient reports 2-3 BM yesterday. She endorses lower abdominal cramping with BM.   Objective:  Vital signs in last 24 hours: Vitals:   10/28/20 2007 10/28/20 2340 10/29/20 0500 10/29/20 0538  BP: (!) 137/54 (!) 129/58  (!) 148/65  Pulse: (!) 57 (!) 57  (!) 58  Resp: 17 18  (!) 22  Temp: (!) 97.5 F (36.4 C)   98 F (36.7 C)  TempSrc: Oral   Oral  SpO2: 98% 98%  97%  Weight:   77.7 kg   Height:       Supplemental O2: Room Air SpO2: 97 % O2 Flow Rate (L/min): 0 L/min   Physical Exam:  Physical Exam Constitutional:      General: She is not in acute distress.    Comments: Alert, awake, oriented to person, time and place  HENT:     Head: Normocephalic.  Eyes:     General:        Right eye: No discharge.        Left eye: No discharge.  Cardiovascular:     Rate and Rhythm: Normal rate and regular rhythm.  Pulmonary:     Effort: Pulmonary effort is normal. No respiratory distress.  Abdominal:     General: Bowel sounds are normal. There is no distension.     Tenderness: There is no abdominal tenderness. There is no guarding.  Skin:    General: Skin is warm.  Neurological:     Mental Status: She is alert.  Psychiatric:        Mood and Affect: Mood normal.     Filed Weights   10/27/20 0443 10/28/20 0500 10/29/20 0500  Weight: 80.4 kg 80.2 kg 77.7 kg     Intake/Output Summary (Last 24 hours) at 10/29/2020 0651 Last data filed at 10/29/2020 0553 Gross per 24 hour  Intake 780 ml  Output 1650 ml  Net -870 ml   Net IO Since Admission: -3,012.33 mL [10/29/20 0651]  Pertinent Labs: CBC Latest Ref Rng & Units 10/29/2020 10/28/2020 10/27/2020  WBC 4.0 - 10.5 K/uL 5.1 5.2 4.5  Hemoglobin 12.0 - 15.0 g/dL 10.1(L) 9.6(L) 9.8(L)  Hematocrit 36.0 - 46.0 % 32.2(L) 30.2(L) 29.6(L)  Platelets  150 - 400 K/uL 186 169 176    CMP Latest Ref Rng & Units 10/29/2020 10/28/2020 10/27/2020  Glucose 70 - 99 mg/dL 94 75 97  BUN 8 - 23 mg/dL 12 15 13   Creatinine 0.44 - 1.00 mg/dL 0.94 0.93 0.83  Sodium 135 - 145 mmol/L 133(L) 135 135  Potassium 3.5 - 5.1 mmol/L 4.3 3.4(L) 3.9  Chloride 98 - 111 mmol/L 108 107 107  CO2 22 - 32 mmol/L 18(L) 20(L) 19(L)  Calcium 8.9 - 10.3 mg/dL 9.2 9.3 9.2  Total Protein 6.5 - 8.1 g/dL - - -  Total Bilirubin 0.3 - 1.2 mg/dL - - -  Alkaline Phos 38 - 126 U/L - - -  AST 15 - 41 U/L - - -  ALT 0 - 44 U/L - - -    Imaging: No results found.  Assessment/Plan:   Principal Problem:   Encephalopathy, hepatic (HCC) Active Problems:   Liver cirrhosis (HCC)   Essential hypertension   Hypokalemia   COVID-19 virus infection   Type 2 diabetes mellitus ([place])   Patient Summary:  Sheila [name] a 75 year old woman with past medical history of NASH cirrhosis with hepatic encephalopathy,NSTEMI,PVC,hypertension, type 2 diabetes mellitus, CKD stage 3a,overactive bladder, bipolar disorder, vitamin B12 deficiencywho presentedto [facility] via [facility] from [place] with a 1-day history of confusion and admitted due to concern for hepatic encephalopathy. Improved with Lactulose.    Encephalopathy secondary to suspected hepatic encephalopathy Cirrhosis secondary to non-alcoholic steatohepatitis Patient's mental status continue to improve. She had 3 BMs yesterday and had 3 doses of lactulose 30 g yesterday. Will adjust lactulose to 30 g 3 times daily and titrate to 2-3 bowel moments per day.  TOC was consulted for Rifaximin cost. -Lactulose30 gqid. Titrate to2-3BMs per day -Continue rifaximin -Discontinue nadolol due to bradycardia.  - Continue PT/OT for reassesment after improvement in mental status   Acute urinary retention Attempt voiding trial today given significant improvement of mental status - Pending urine culture - Hold  Myrbetrig   Normocytic anemia Vitamin B12 deficiency Iron deficiency anemia Patient will need an outpatient follow-up with GI for EGD and colonoscopy for her iron deficiency is anemia - AM CBC   Hypertension - Hold HCTZ and Irbesartan due to history of cirrhosis - Continue home hydralazine 25 mg q6h -ContinueSpironolactone to 100 mg daily    Heart failure with preserved ejection fraction History of NSTEMI History ofPVCson Metoprolol Patient had1650cc of urine output yesterday.  -PO Lasix 40 mg daily - Spironolactone 100 mg daily  - strict I/O, daily weights - Holding home metoprolol    COVID-19 infection No indication for remdesivir or steroids at this time.    Hyperlipidemia - Continue home ezetimibe 10 mg daily   Type 2 diabetes mellitus Last A1c 5.3 on 1/24/22which can be falsely low.  -Willhold metformin after discharge in the setting of cirrhosis.   Bipolar disorder - Continue home aripiprazole 10 mg QHS - Continue home fluoxetine 40 mg daily - Continue bupropion 300 mg daily - Holdinggabapentinin setting of confusion   Diet:Carb-Modified PQZ:[id number] HLK:TGYB Code:Full  Prior to Admission Living Arrangement:Living at [facility] Anticipated Discharge Location:TBD Barriers to Discharge:treatment of encephalopathy. Pending PT/OT assessment.   Gaylan Gerold, DO 10/29/2020, 6:51 AM Pager: [phone]  Please contact the on call pager after 5 pm and on weekends at [phone].

## 2020-10-30 DIAGNOSIS — K7581 Nonalcoholic steatohepatitis (NASH): Secondary | ICD-10-CM | POA: Diagnosis not present

## 2020-10-30 DIAGNOSIS — R339 Retention of urine, unspecified: Secondary | ICD-10-CM | POA: Diagnosis not present

## 2020-10-30 DIAGNOSIS — K7469 Other cirrhosis of liver: Secondary | ICD-10-CM | POA: Diagnosis not present

## 2020-10-30 DIAGNOSIS — K7290 Hepatic failure, unspecified without coma: Secondary | ICD-10-CM | POA: Diagnosis not present

## 2020-10-30 LAB — BASIC METABOLIC PANEL
Anion gap: 7 (ref 5–15)
BUN: 13 mg/dL (ref 8–23)
CO2: 17 mmol/L — ABNORMAL LOW (ref 22–32)
Calcium: 9.3 mg/dL (ref 8.9–10.3)
Chloride: 109 mmol/L (ref 98–111)
Creatinine, Ser: 0.93 mg/dL (ref 0.44–1.00)
GFR, Estimated: >60 mL/min (ref >60)
Glucose, Bld: 116 mg/dL — ABNORMAL HIGH (ref 70–99)
Potassium: 4.5 mmol/L (ref 3.5–5.1)
Sodium: 133 mmol/L — ABNORMAL LOW (ref 135–145)

## 2020-10-30 LAB — CBC
HCT: 31.3 % — ABNORMAL LOW (ref 36.0–46.0)
Hemoglobin: 10.3 g/dL — ABNORMAL LOW (ref 12.0–15.0)
MCH: 28.1 pg (ref 26.0–34.0)
MCHC: 32.9 g/dL (ref 30.0–36.0)
MCV: 85.3 fL (ref 80.0–100.0)
Platelets: 195 10*3/uL (ref 150–400)
RBC: 3.67 MIL/uL — ABNORMAL LOW (ref 3.87–5.11)
RDW: 19.9 % — ABNORMAL HIGH (ref 11.5–15.5)
WBC: 4.9 10*3/uL (ref 4.0–10.5)
nRBC: 0 % (ref 0.0–0.2)

## 2020-10-30 NOTE — Plan of Care (Signed)
  Problem: Education: Goal: Knowledge of General Education information will improve Description: Including pain rating scale, medication(s)/side effects and non-pharmacologic comfort measures Outcome: Progressing   Problem: Clinical Measurements: Goal: Ability to maintain clinical measurements within normal limits will improve Outcome: Progressing Goal: Diagnostic test results will improve Outcome: Progressing   Problem: Activity: Goal: Risk for activity intolerance will decrease Outcome: Progressing   Problem: Education: Goal: Knowledge of risk factors and measures for prevention of condition will improve Outcome: Progressing   Problem: Coping: Goal: Psychosocial and spiritual needs will be supported Outcome: Progressing   Problem: Respiratory: Goal: Complications related to the disease process, condition or treatment will be avoided or minimized Outcome: Progressing

## 2020-10-30 NOTE — Progress Notes (Addendum)
Subjective: Has had some food this morning. Does not feel she had a BM yesterday. States she had 3 the day before yesterday and none since then and wasn't able to go. States she needs her lactulose at 8 and 12 and that it will help with her BM. 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   Notes some burning with urination earlier this week but not for a while. Has been retaining. States she has not had the urge since waking up half and hour ago. Has difficulty differentiating between urge for BM and urination. Felt bladder with full on exam with exam, Encourage her to continue trying to avoid another in and out. Encourages continue participation with PT.  Feels generally distressed about situation, feels obligated to go to be with husband but know he cannot take care of him. Feels that is hard to concerntrate on her health at home with husband.    States she has lost a lot of strength since being in the hospital. She was able to get up to a chair yesterday with PT. Was unable to walk. Would like to get stronger, able to bathe and dress her self and walk more before she goes home. Discussed plan for rehab at [facility] after discharge. States husband was at one after stroke and states he had a hard time and she is concerned about this. She is agreeable to this at this time.   Objective:  Vital signs in last 24 hours: Vitals:   10/29/20 1436 10/29/20 2012 10/29/20 2336 10/30/20 0419  BP: (!) 140/54 (!) 165/72 (!) 165/63 (!) 153/63  Pulse: 62 61 61 68  Resp: 14 19  19   Temp: 98.3 F (36.8 C) 98.3 F (36.8 C)  97.9 F (36.6 C)  TempSrc: Oral Oral  Oral  SpO2: 98% 97%  98%  Weight:    77.2 kg  Height:       Physical Exam Vitals reviewed.  Constitutional:      General: She is not in acute distress.    Appearance: Normal appearance. She is not toxic-appearing.  HENT:     Head: Normocephalic and atraumatic.  Eyes:     Extraocular Movements: Extraocular movements intact.      Conjunctiva/sclera: Conjunctivae normal.  Abdominal:     General: Abdomen is flat. Bowel sounds are normal. There is no distension.     Palpations: Abdomen is soft.     Tenderness: There is no abdominal tenderness. There is no guarding.  Musculoskeletal:        General: No swelling or tenderness.     Right lower leg: No edema.     Left lower leg: No edema.  Skin:    General: Skin is warm and dry.  Neurological:     Mental Status: She is alert and oriented to person, place, and time.  Psychiatric:        Mood and Affect: Mood normal.        Behavior: Behavior normal.        Thought Content: Thought content normal.        Judgment: Judgment normal.     Assessment/Plan:  Principal Problem:   Encephalopathy, hepatic (HCC) Active Problems:   Liver cirrhosis (HCC)   Essential hypertension   Hypokalemia   COVID-19 virus infection   Type 2 diabetes mellitus (HCC)  Sheila [name] is a 75 year old woman with past medical history of NASH cirrhosis with hepatic encephalopathy, NSTEMI, PVC, hypertension, type 2 diabetes mellitus, CKD stage 3a, overactive  bladder, bipolar disorder, vitamin B12 deficiency who presented to the [facility] ED via EMS from [place] with a 1-day history of confusion and admitted due to concern for hepatic encephalopathy. Improved with Lactulose.      Encephalopathy secondary to suspected hepatic encephalopathy Cirrhosis secondary to non-alcoholic steatohepatitis  Patient's mental status continue to improve. She had 3 BMs yesterday a small 1 overnight.  And had 3 doses of lactulose 30 g yesterday. Will adjust lactulose to 30 g 3 times daily and titrate to 2-3 bowel moments per day.  TOC was consulted for Rifaximin cost. - Lactulose 30 g qid. Titrate to 2-3 BMs per day - Continue rifaximin - Discontinue nadolol due to bradycardia.  - PT/OT recommended SNF, patient amenable to this plan   Acute urinary retention Patient able to urinate some this morning however  postvoid bladder scan still showed approximately 200 cc.  Continue to encourage mobilization. Urine culture grew lactobacillus, not a typical organism to cause UTI. - Hold Myrbetrig   Normocytic anemia Vitamin B12 deficiency Iron deficiency anemia Stable.  Patient will need an outpatient follow-up with GI for EGD and colonoscopy for her iron deficiency is anemia   Hypertension - Hold HCTZ and Irbesartan due to history of cirrhosis - Continue home hydralazine 25 mg q6h - Continue Spironolactone to 100 mg daily    Heart failure with preserved ejection fraction History of NSTEMI History of PVCs on Metoprolol Patient had 1L of urine output yesterday.  - PO Lasix 40 mg daily  - Spironolactone 100 mg daily  - strict I/O, daily weights - Holding home metoprolol    COVID-19 infection Covid positive on 2/18.  Asymptomatic.  No indication for remdesivir or steroids at this time.    Type 2 diabetes mellitus Last A1c 5.3 on 09/27/20 which can be falsely low.  - Will hold metformin after discharge in the setting of cirrhosis.    Bipolar disorder - Continue home aripiprazole 10 mg QHS - Continue home fluoxetine 40 mg daily - Continue bupropion 300 mg daily - Holding gabapentin in setting of confusion  Prior to Admission Living Arrangement: [place] Anticipated Discharge Location: SNF Barriers to Discharge: SNF placement  Dispo: Anticipated discharge pending SNF placement.  Mike Craze, DO 10/30/2020, 6:47 AM Pager: [phone] After 5pm on weekdays and 1pm on weekends: On Call pager [phone]

## 2020-10-30 NOTE — Progress Notes (Signed)
No IV access; MD aware. Patient doesn't have scheduled any IV medications.

## 2020-10-30 NOTE — Progress Notes (Signed)
Patient complaining of pain in her mouth. On assessment patient's tongue is red with lateral white patches. MD made aware.

## 2020-10-31 LAB — BASIC METABOLIC PANEL
Anion gap: 8 (ref 5–15)
BUN: 13 mg/dL (ref 8–23)
CO2: 19 mmol/L — ABNORMAL LOW (ref 22–32)
Calcium: 9.6 mg/dL (ref 8.9–10.3)
Chloride: 109 mmol/L (ref 98–111)
Creatinine, Ser: 0.98 mg/dL (ref 0.44–1.00)
GFR, Estimated: 60 mL/min — ABNORMAL LOW (ref >60)
Glucose, Bld: 129 mg/dL — ABNORMAL HIGH (ref 70–99)
Potassium: 4.8 mmol/L (ref 3.5–5.1)
Sodium: 136 mmol/L (ref 135–145)

## 2020-10-31 LAB — CBC
HCT: 30.4 % — ABNORMAL LOW (ref 36.0–46.0)
Hemoglobin: 10.1 g/dL — ABNORMAL LOW (ref 12.0–15.0)
MCH: 28.3 pg (ref 26.0–34.0)
MCHC: 33.2 g/dL (ref 30.0–36.0)
MCV: 85.2 fL (ref 80.0–100.0)
Platelets: 184 10*3/uL (ref 150–400)
RBC: 3.57 MIL/uL — ABNORMAL LOW (ref 3.87–5.11)
RDW: 20.7 % — ABNORMAL HIGH (ref 11.5–15.5)
WBC: 5 10*3/uL (ref 4.0–10.5)
nRBC: 0 % (ref 0.0–0.2)

## 2020-10-31 MED ORDER — CHLORHEXIDINE GLUCONATE CLOTH 2 % EX PADS
6.0000 | MEDICATED_PAD | Freq: Every day | CUTANEOUS | Status: DC
  Administered 2020-10-31 – 2020-11-02 (×3): 6 via TOPICAL

## 2020-10-31 NOTE — Progress Notes (Addendum)
Bladder scan this AM - 709 ml left after patient voided 200 ml. MD made aware. Per MD order Foley 14 Fr was placed. Yellow clear urine returned. Patient tolerated well.

## 2020-10-31 NOTE — Plan of Care (Signed)
  Problem: Education: Goal: Knowledge of General Education information will improve Description: Including pain rating scale, medication(s)/side effects and non-pharmacologic comfort measures Outcome: Progressing   Problem: Clinical Measurements: Goal: Ability to maintain clinical measurements within normal limits will improve Outcome: Progressing Goal: Diagnostic test results will improve Outcome: Progressing   Problem: Activity: Goal: Risk for activity intolerance will decrease Outcome: Progressing   Problem: Education: Goal: Knowledge of risk factors and measures for prevention of condition will improve Outcome: Progressing   Problem: Coping: Goal: Psychosocial and spiritual needs will be supported Outcome: Progressing

## 2020-10-31 NOTE — Progress Notes (Signed)
HD#9 Subjective:  Overnight Events: Complaints of thrust. Also retaining urine.   Patient is seen at bedside. She appears pleasant and answer questions appropriately. Patient reports she is having trouble urinating. States the straight cath is very painful. She states her PCP prescribed the mybetreiq because of her incontinence.   Patient reports 1 BM yesterday. States that she does not like the food here and not eating much. Denies any abdominal pain. She says she has lost about 40 lbs since she has been here.   She feels discouraged because of her medical conditions. Reassured her that things are much improved since when she came in.   Objective:  Vital signs in last 24 hours: Vitals:   10/30/20 2028 10/30/20 2333 10/31/20 0430 10/31/20 0545  BP: (!) 126/48 128/66 (!) 122/59   Pulse: 68  69   Resp: 18  18   Temp: 98.8 F (37.1 C)  98.1 F (36.7 C)   TempSrc: Oral  Axillary   SpO2: 99%  97%   Weight:    77.7 kg  Height:       Supplemental O2: Room Air SpO2: 97 % O2 Flow Rate (L/min): 0 L/min   Physical Exam:  Physical Exam Constitutional:      General: She is not in acute distress.    Appearance: Normal appearance.  HENT:     Head: Normocephalic.  Eyes:     General:        Right eye: No discharge.        Left eye: No discharge.  Cardiovascular:     Rate and Rhythm: Normal rate and regular rhythm.  Pulmonary:     Effort: Pulmonary effort is normal. No respiratory distress.  Abdominal:     General: Bowel sounds are normal. There is no distension.     Tenderness: There is no abdominal tenderness. There is no guarding.  Musculoskeletal:     Right lower leg: Edema (+1) present.     Left lower leg: Edema (+1) present.  Skin:    General: Skin is warm.  Neurological:     Mental Status: She is alert.  Psychiatric:        Mood and Affect: Mood normal.        Thought Content: Thought content normal.     Filed Weights   10/29/20 0500 10/30/20 0419 10/31/20  0545  Weight: 77.7 kg 77.2 kg 77.7 kg     Intake/Output Summary (Last 24 hours) at 10/31/2020 0717 Last data filed at 10/30/2020 2338 Gross per 24 hour  Intake 800 ml  Output 1300 ml  Net -500 ml   Net IO Since Admission: -4,012.33 mL [10/31/20 0717]  Pertinent Labs: CBC Latest Ref Rng & Units 10/31/2020 10/30/2020 10/29/2020  WBC 4.0 - 10.5 K/uL 5.0 4.9 5.1  Hemoglobin 12.0 - 15.0 g/dL 10.1(L) 10.3(L) 10.1(L)  Hematocrit 36.0 - 46.0 % 30.4(L) 31.3(L) 32.2(L)  Platelets 150 - 400 K/uL 184 195 186    CMP Latest Ref Rng & Units 10/31/2020 10/30/2020 10/29/2020  Glucose 70 - 99 mg/dL 129(H) 116(H) 94  BUN 8 - 23 mg/dL 13 13 12   Creatinine 0.44 - 1.00 mg/dL 0.98 0.93 0.94  Sodium 135 - 145 mmol/L 136 133(L) 133(L)  Potassium 3.5 - 5.1 mmol/L 4.8 4.5 4.3  Chloride 98 - 111 mmol/L 109 109 108  CO2 22 - 32 mmol/L 19(L) 17(L) 18(L)  Calcium 8.9 - 10.3 mg/dL 9.6 9.3 9.2  Total Protein 6.5 - 8.1 g/dL - - -  Total Bilirubin 0.3 - 1.2 mg/dL - - -  Alkaline Phos 38 - 126 U/L - - -  AST 15 - 41 U/L - - -  ALT 0 - 44 U/L - - -    Imaging: No results found.  Assessment/Plan:   Principal Problem:   Encephalopathy, hepatic (HCC) Active Problems:   Liver cirrhosis (HCC)   Essential hypertension   Hypokalemia   COVID-19 virus infection   Type 2 diabetes mellitus ([place])   Patient Summary: Sheila [name] a 75 year old woman with past medical history of NASH cirrhosis with hepatic encephalopathy,NSTEMI,PVC,hypertension, type 2 diabetes mellitus, CKD stage 3a,overactive bladder, bipolar disorder, vitamin B12 deficiencywho presentedto the [facility] via EMS from [place] with a 1-day history of confusion and admitted due to concern for hepatic encephalopathy.Improved with Lactulose.   Encephalopathy secondary to suspected hepatic encephalopathy Cirrhosis secondary to non-alcoholic steatohepatitis Patient's mental status remains at baseline. Only had 1 BM yesterday but  patient also not eating much. Given her normal mental status, will continue lactulose 30g TID. Goal to have 2-3 BM per day. Encourage more PO intake.  TOC was consulted for Rifaximin cost. -Lactulose30 gqid. Titrate to2-3BMs per day -Continue rifaximin -Discontinue nadolol due to bradycardia. -PT/OT recommended SNF, patient amenable to this plan.   Acute urinary retention Per RN, patient has 709 cc post void residual volume. Patient complains of pain I/o cath. After discussion with patient, will replace Foley. Continue holding Myrbetrig. Patient will need a urology referral after discharge.   Ua was bland. However urine culture grew Lactobacillus. Low suspicion for UTI.  - Hold Myrbetrig - Urology referral after discharge  Normocytic anemia Vitamin B12 deficiency Iron deficiency anemia Stable.  Patient will need an outpatient follow-up with GI for EGD and colonoscopy for her iron deficiency is anemia  Hypertension - Hold HCTZ and Irbesartan due to history of cirrhosis - Continue home hydralazine 25 mg q6h -ContinueSpironolactone to 100 mg daily   Heart failure with preserved ejection fraction History of NSTEMI History ofPVCson Metoprolol -PO Lasix 40 mg daily - Spironolactone 100 mg daily - strict I/O, daily weights - Holding home metoprolol   COVID-19 infection Covid positive on 2/18.  Asymptomatic.  No indication for remdesivir or steroids at this time.   Type 2 diabetes mellitus Last A1c 5.3 on 1/24/22which can be falsely low.  -Willhold metformin after discharge in the setting of cirrhosis.  Bipolar disorder - Continue home aripiprazole 10 mg QHS - Continue home fluoxetine 40 mg daily - Continue bupropion 300 mg daily - Holdinggabapentinin setting of confusion  Prior to Admission Living Arrangement: [place] Anticipated Discharge Location: SNF Barriers to Discharge: SNF placement  Dispo: Anticipated discharge pending SNF  placement.  Gaylan Gerold, DO 10/31/2020, 7:17 AM Pager: [phone]  Please contact the on call pager after 5 pm and on weekends at [phone].

## 2020-10-31 NOTE — Progress Notes (Signed)
Patient has >400 ml in bladder according to scan. Pt refused to be straight cath due to pain from previous attempts and states she would like to wait to speak to hospitalist in AM. MD made aware.

## 2020-11-01 DIAGNOSIS — K7581 Nonalcoholic steatohepatitis (NASH): Secondary | ICD-10-CM | POA: Diagnosis not present

## 2020-11-01 DIAGNOSIS — R339 Retention of urine, unspecified: Secondary | ICD-10-CM | POA: Diagnosis not present

## 2020-11-01 DIAGNOSIS — K7290 Hepatic failure, unspecified without coma: Secondary | ICD-10-CM | POA: Diagnosis not present

## 2020-11-01 DIAGNOSIS — K7469 Other cirrhosis of liver: Secondary | ICD-10-CM | POA: Diagnosis not present

## 2020-11-01 LAB — CBC
HCT: 32.4 % — ABNORMAL LOW (ref 36.0–46.0)
Hemoglobin: 10.6 g/dL — ABNORMAL LOW (ref 12.0–15.0)
MCH: 28.4 pg (ref 26.0–34.0)
MCHC: 32.7 g/dL (ref 30.0–36.0)
MCV: 86.9 fL (ref 80.0–100.0)
Platelets: 210 10*3/uL (ref 150–400)
RBC: 3.73 MIL/uL — ABNORMAL LOW (ref 3.87–5.11)
RDW: 21.9 % — ABNORMAL HIGH (ref 11.5–15.5)
WBC: 4.9 10*3/uL (ref 4.0–10.5)
nRBC: 0 % (ref 0.0–0.2)

## 2020-11-01 MED ORDER — LACTULOSE 10 GM/15ML PO SOLN
30.0000 g | Freq: Three times a day (TID) | ORAL | Status: DC
  Administered 2020-11-01 (×2): 30 g via ORAL
  Filled 2020-11-01 (×2): qty 45

## 2020-11-01 MED ORDER — ONDANSETRON HCL 4 MG PO TABS
4.0000 mg | ORAL_TABLET | Freq: Three times a day (TID) | ORAL | Status: AC | PRN
  Administered 2020-11-01 – 2020-11-02 (×3): 4 mg via ORAL
  Filled 2020-11-01 (×4): qty 1

## 2020-11-01 MED ORDER — PROMETHAZINE HCL 25 MG RE SUPP
25.0000 mg | Freq: Four times a day (QID) | RECTAL | Status: AC | PRN
  Administered 2020-11-01: 25 mg via RECTAL
  Filled 2020-11-01: qty 1

## 2020-11-01 MED ORDER — LACTULOSE 10 GM/15ML PO SOLN
30.0000 g | Freq: Four times a day (QID) | ORAL | Status: DC
  Administered 2020-11-01 – 2020-11-02 (×4): 30 g via ORAL
  Filled 2020-11-01 (×4): qty 45

## 2020-11-01 MED ORDER — ONDANSETRON HCL 4 MG/2ML IJ SOLN
4.0000 mg | Freq: Four times a day (QID) | INTRAMUSCULAR | Status: DC | PRN
  Filled 2020-11-01: qty 2

## 2020-11-01 MED ORDER — DICLOFENAC SODIUM 1 % EX GEL
4.0000 g | Freq: Four times a day (QID) | CUTANEOUS | Status: DC
  Administered 2020-11-01 – 2020-11-02 (×5): 4 g via TOPICAL
  Filled 2020-11-01: qty 100

## 2020-11-01 NOTE — TOC Progression Note (Signed)
Transition of Care [facility]) - Progression Note    Patient Details  Name: Sheila [name] MRN: [id number] Date of Birth: 1946-08-15  Transition of Care [facility]) CM/SW Contact  Reece Agar, [place] Phone Number: 11/01/2020, 9:18 AM  Clinical Narrative:    9:10am CSW spoke with patient about her SNF options being [facility] or [place] because of [place] regulations. Patient declined stating that her husband had a bad experience at one and she has heard bad things about the other so she would like to go home. She also stated that she lives in [place] where they provide PT and OT services.      Expected Discharge Plan: [place] Barriers to Discharge: Continued Medical Work up  Expected Discharge Plan and Services Expected Discharge Plan: [place] In-house Referral: Clinical Social Work   Post Acute Care Choice: Resumption of Svcs/PTA Provider Living arrangements for the past 2 months: [place]                                       Social Determinants of Health (SDOH) Interventions    Readmission Risk Interventions Readmission Risk Prevention Plan 05/15/2019  Post Dischage Appt Complete  Medication Screening Complete  Transportation Screening Complete

## 2020-11-01 NOTE — Progress Notes (Signed)
RE: Sheila [name]  Date of Birth: 1946/05/29 Date:  11/01/2020  Please be advised that the above-named patient will require a short-term nursing home stay - anticipated 30 days or less for rehabilitation and strengthening.  The plan is for return home.

## 2020-11-01 NOTE — TOC Progression Note (Addendum)
Transition of Care [facility]) - Progression Note    Patient Details  Name: Sheila [name] MRN: [id number] Date of Birth: 1946-04-10  Transition of Care [facility]) CM/SW Contact  Reece Agar, [place] Phone Number: 11/01/2020, 1:25 PM  Clinical Narrative:    1:11pm CSW received message of patient wanting to speak again about discharge, CSW spoke with patient about her options again and she was adamant about not wanting to go to [place] and was unsure about [place]. Patient stated that she is very weak and her husband has Alzheimers so she no longer wants to go home. CSW will follow up with patient.   2:42 PM per MD patient can come off of isolation CSW sent referral to [facility] in [place].    Expected Discharge Plan: [place] Barriers to Discharge: Continued Medical Work up  Expected Discharge Plan and Services Expected Discharge Plan: [place] In-house Referral: Clinical Social Work   Post Acute Care Choice: Resumption of Svcs/PTA Provider Living arrangements for the past 2 months: [place]                                       Social Determinants of Health (SDOH) Interventions    Readmission Risk Interventions Readmission Risk Prevention Plan 05/15/2019  Post Dischage Appt Complete  Medication Screening Complete  Transportation Screening Complete

## 2020-11-01 NOTE — Care Management Important Message (Signed)
Important Message  Patient Details  Name: Sheila [name] MRN: [id number] Date of Birth: 1946/05/27   Medicare Important Message Given:  Yes - Important Message mailed due to current National Emergency   Verbal consent obtained due to current National Emergency  Relationship to patient: Self Contact Name: Shaterria Sager Call Date: 11/01/20  Time: 1258 Phone: [phone] Outcome: Spoke with contact Important Message mailed to: Patient address on file    Delorse Lek 11/01/2020, 12:58 PM

## 2020-11-01 NOTE — Progress Notes (Addendum)
HD#10 Subjective:  Overnight Events: no acute event  Patient is sitting on reclining chair during examination.  She appears comfortable and in no acute distress.  She answers questions appropriately.  Patient state that she did not have a bowel movement yesterday.  States that she did not like the food here but able to eat pancakes/sausage this morning.  Patient expressed that she does not have any family visiting her due to Covid restrictions and her husband having Alzheimer's.  Objective:  Vital signs in last 24 hours: Vitals:   10/31/20 1500 10/31/20 2056 10/31/20 2345 11/01/20 0452  BP: (!) 143/55 (!) 115/49 111/60 (!) 151/51  Pulse: 73 73  62  Resp: 19 20  20   Temp: 98 F (36.7 C) 98.3 F (36.8 C)  97.9 F (36.6 C)  TempSrc: Oral Oral  Oral  SpO2: 99% 98%  97%  Weight:      Height:       Supplemental O2: Room Air SpO2: 97 % O2 Flow Rate (L/min): 0 L/min   Physical Exam:  Physical Exam Constitutional:      General: She is not in acute distress. HENT:     Head: Normocephalic.  Eyes:     General:        Right eye: No discharge.        Left eye: No discharge.  Pulmonary:     Effort: Pulmonary effort is normal. No respiratory distress.  Abdominal:     General: There is no distension.     Tenderness: There is no abdominal tenderness.  Musculoskeletal:     Right lower leg: Edema (Trace) present.     Left lower leg: Edema (Trace) present.  Neurological:     Mental Status: She is alert.  Psychiatric:        Mood and Affect: Mood normal.     Filed Weights   10/29/20 0500 10/30/20 0419 10/31/20 0545  Weight: 77.7 kg 77.2 kg 77.7 kg     Intake/Output Summary (Last 24 hours) at 11/01/2020 0737 Last data filed at 11/01/2020 0555 Gross per 24 hour  Intake 800 ml  Output 2075 ml  Net -1275 ml   Net IO Since Admission: -5,287.33 mL [11/01/20 0737]  Pertinent Labs: CBC Latest Ref Rng & Units 10/31/2020 10/30/2020 10/29/2020  WBC 4.0 - 10.5 K/uL 5.0 4.9 5.1   Hemoglobin 12.0 - 15.0 g/dL 10.1(L) 10.3(L) 10.1(L)  Hematocrit 36.0 - 46.0 % 30.4(L) 31.3(L) 32.2(L)  Platelets 150 - 400 K/uL 184 195 186    CMP Latest Ref Rng & Units 10/31/2020 10/30/2020 10/29/2020  Glucose 70 - 99 mg/dL 129(H) 116(H) 94  BUN 8 - 23 mg/dL 13 13 12   Creatinine 0.44 - 1.00 mg/dL 0.98 0.93 0.94  Sodium 135 - 145 mmol/L 136 133(L) 133(L)  Potassium 3.5 - 5.1 mmol/L 4.8 4.5 4.3  Chloride 98 - 111 mmol/L 109 109 108  CO2 22 - 32 mmol/L 19(L) 17(L) 18(L)  Calcium 8.9 - 10.3 mg/dL 9.6 9.3 9.2  Total Protein 6.5 - 8.1 g/dL - - -  Total Bilirubin 0.3 - 1.2 mg/dL - - -  Alkaline Phos 38 - 126 U/L - - -  AST 15 - 41 U/L - - -  ALT 0 - 44 U/L - - -    Imaging: No results found.  Assessment/Plan:   Principal Problem:   Encephalopathy, hepatic (HCC) Active Problems:   Liver cirrhosis (HCC)   Essential hypertension   Hypokalemia   COVID-19 virus infection  Type 2 diabetes mellitus ([place])   Patient Summary: Sheila [name] is a 75 year old woman with past medical history of NASH cirrhosis with hepatic encephalopathy, NSTEMI, PVC, hypertension, type 2 diabetes mellitus, CKD stage 3a, overactive bladder, bipolar disorder, vitamin B12 deficiency who presented [facility] ED via EMS from [place] with a 1-day history of confusion due to hepatic encephalopathy.  Mental status back to baseline   Encephalopathy secondary to suspected hepatic encephalopathy Cirrhosis secondary to non-alcoholic steatohepatitis  Patient's mental status remains at baseline.  Patient did not have any bowel movement yesterday but she also not eating much.  Will increase lactulose 30 g to four times daily. Goal to have 2-3 BM per day. Encourage more PO intake. Diet was changed to regular yesterday. - TOC was consulted for Rifaximin cost. - Lactulose 30 g qid. Titrate to 2-3 BMs per day - Continue rifaximin - Discontinue nadolol due to bradycardia.  - PT/OT recommended SNF, patient amenable  to this plan.    Acute urinary retention Foley in place. Continue holding Myrbetrig. Patient will need a urology referral after discharge.   Ua was bland. However urine culture grew Lactobacillus. Low suspicion for UTI.  - Hold Myrbetrig - Urology referral after discharge   Normocytic anemia Vitamin B12 deficiency Iron deficiency anemia Stable.  Patient will need an outpatient follow-up with GI for EGD and colonoscopy for her iron deficiency is anemia   Hypertension - Hold HCTZ and Irbesartan due to history of cirrhosis - Continue home hydralazine 25 mg q6h - Continue Spironolactone to 100 mg daily    Heart failure with preserved ejection fraction History of NSTEMI History of PVCs on Metoprolol - PO Lasix 40 mg daily  - Spironolactone 100 mg daily  - strict I/O, daily weights - Holding home metoprolol for bradycardia, which has since improved   COVID-19 infection Should be out of covid window tomorrow.   Type 2 diabetes mellitus Last A1c 5.3 on 09/27/20 which can be falsely low.  - Will hold metformin after discharge in the setting of cirrhosis.    Bipolar disorder - Continue home aripiprazole 10 mg QHS - Continue home fluoxetine 40 mg daily - Continue bupropion 300 mg daily - Holding gabapentin in setting of confusion   Prior to Admission Living Arrangement: [place] Anticipated Discharge Location: SNF Barriers to Discharge: SNF placement   Dispo: Anticipated discharge pending SNF placement.  Gaylan Gerold, DO 11/01/2020, 7:37 AM Pager: [phone]  Please contact the on call pager after 5 pm and on weekends at [phone].

## 2020-11-01 NOTE — Progress Notes (Signed)
Physical Therapy Treatment Patient Details Name: Sheila [name] MRN: [id number] DOB: February 26, 1946 Today's Date: 11/01/2020    History of Present Illness Pt is a 75 y.o. female admitted from [place] on 10/22/20 with AMS and cough; incidental (+) COVID-19. [place] for hepatic encephalopathy from missing lactulose meds for at least a week. PMH includes [name] cirrhosis, hepatic encephalopathy, CAD, HTN, DM2, CKD 3, vulva CA, bipolar disorder.   PT Comments    Pt endorses increased fatigue and bilateral foot pain this session, difficulty accepting weight onto feet with standing trials requiring maxA with RW. Able to perform open-chain LE therex seated in recliner. Pt realizing she will not be able to manage at home in current condition, now willing to consider short-term rehab at [facility].    Follow Up Recommendations  SNF;Supervision for mobility/OOB     Equipment Recommendations  Rolling walker with 5" wheels;3in1 (PT);Wheelchair (measurements PT);Wheelchair cushion (measurements PT)    Recommendations for Other Services       Precautions / Restrictions Precautions Precautions: Fall;Other (comment) Precaution Comments: painful feet with difficulty weight bearing today Restrictions Weight Bearing Restrictions: No    Mobility  Bed Mobility               General bed mobility comments: Received sitting in recliner    Transfers Overall transfer level: Needs assistance Equipment used: Rolling walker (2 wheeled) Transfers: Sit to/from Stand Sit to Stand: Max assist         General transfer comment: Unable to stand on initial attempt due to c/o foot pain with increased weight bearing when translating weight anteriorly to stand despite maxA; 2x additional trials with maxA, pt able to stand but difficulty transitioning BUE support from recliner armrests to RW requiring maxA to prevent posterior LOB  Ambulation/Gait                 Stairs             Wheelchair  Mobility    Modified Rankin (Stroke Patients Only)       Balance Overall balance assessment: Needs assistance Sitting-balance support: Feet supported Sitting balance-Leahy Scale: Fair                                      Cognition Arousal/Alertness: Awake/alert Behavior During Therapy: WFL for tasks assessed/performed Overall Cognitive Status: No family/caregiver present to determine baseline cognitive functioning Area of Impairment: Attention;Following commands;Problem solving;Memory;Safety/judgement;Awareness                   Current Attention Level: Selective Memory: Decreased short-term memory Following Commands: Follows one step commands consistently;Follows multi-step commands inconsistently Safety/Judgement: Decreased awareness of deficits Awareness: Emergent Problem Solving: Slow processing;Difficulty sequencing;Requires verbal cues        Exercises Other Exercises Other Exercises: [employer] HEP handout (Access Code [phone]) given and performed - ankle pumps, seated heel/toe raises (limited by pain), seated marches, LAQs    General Comments        Pertinent Vitals/Pain Pain Assessment: Faces Faces Pain Scale: Hurts little more Pain Location: Bilateral feet Pain Descriptors / Indicators: Discomfort;Guarding Pain Intervention(s): Monitored during session;Limited activity within patient's tolerance    Home Living                      Prior Function            PT Goals (current goals can now be found  in the care plan section) Progress towards PT goals: Progressing toward goals    Frequency    Min 3X/week      PT Plan Current plan remains appropriate    Co-evaluation              AM-PAC PT "6 Clicks" Mobility   Outcome Measure  Help needed turning from your back to your side while in a flat bed without using bedrails?: A Little Help needed moving from lying on your back to sitting on the side of a flat bed  without using bedrails?: A Little Help needed moving to and from a bed to a chair (including a wheelchair)?: A Lot Help needed standing up from a chair using your arms (e.g., wheelchair or bedside chair)?: A Lot Help needed to walk in hospital room?: A Lot Help needed climbing 3-5 steps with a railing? : Total 6 Click Score: 13    End of Session   Activity Tolerance: Patient limited by pain;Patient limited by fatigue Patient left: in chair;with call bell/phone within reach Nurse Communication: Mobility status PT Visit Diagnosis: Unsteadiness on feet (R26.81);Other abnormalities of gait and mobility (R26.89);Muscle weakness (generalized) (M62.81);Difficulty in walking, not elsewhere classified (R26.2)     Time: 4320-0379 PT Time Calculation (min) (ACUTE ONLY): 20 min  Charges:  $Therapeutic Exercise: 8-22 mins                     Mabeline Caras, PT, DPT Acute Rehabilitation Services  Pager [phone] Office [place] 11/01/2020, 12:14 PM

## 2020-11-01 NOTE — NC FL2 (Addendum)
[place] LEVEL OF CARE SCREENING TOOL     IDENTIFICATION  Patient Name: Sheila [name] Birthdate: 1946/04/14 Sex: female Admission Date (Current Location): 10/22/2020  [facility] and [place] Number:  Herbalist and Address:  [facility], [place] [address] [place], [place] [address]      Provider Number: [id number]  Attending Physician Name and Address:  Oda Kilts, MD  Relative Name and Phone Number:  Marieanne Marxen (Husband) [phone]    Current Level of Care: Hospital Recommended Level of Care: [place] Prior Approval Number:    Date Approved/Denied:   PASRR Number: [id number] Discharge Plan: SNF    Current Diagnoses: Patient Active Problem List   Diagnosis Date Noted  . Hypokalemia 10/23/2020  . COVID-19 virus infection 10/23/2020  . Type 2 diabetes mellitus ([place]) 10/23/2020  . Pressure injury of skin 02/11/2020  . Encephalopathy, hepatic ([place]) 02/09/2020  . Esophageal varices determined by endoscopy ([place]) 06/30/2019  . Portal hypertensive gastropathy ([place]) 06/30/2019  . Elevated troponin   . Demand ischemia ([place])   . Essential hypertension   . NSTEMI (non-ST elevated myocardial infarction) ([place]) 05/23/2019  . Chest pain 05/22/2019  . Hepatic encephalopathy ([place])   . Liver cirrhosis ([place])   . NASH (nonalcoholic steatohepatitis)   . Weakness 05/13/2019  . AKI (acute kidney injury) ([place]) 05/13/2019    Orientation RESPIRATION BLADDER Height & Weight     Self,Time,Situation,Place  Normal Indwelling catheter Weight: 171 lb 4.8 oz (77.7 kg) Height:  5' 5"  (165.1 cm)  BEHAVIORAL SYMPTOMS/MOOD NEUROLOGICAL BOWEL NUTRITION STATUS      Continent Diet (Please see DC summary)  AMBULATORY STATUS COMMUNICATION OF NEEDS Skin   Extensive Assist Verbally Normal                       Personal Care Assistance Level of Assistance  Dressing,Feeding,Bathing Bathing Assistance: Maximum  assistance Feeding assistance: Independent Dressing Assistance: Maximum assistance     Functional Limitations Info  Sight,Hearing,Speech Sight Info: Adequate Hearing Info: Adequate Speech Info: Adequate    SPECIAL CARE FACTORS FREQUENCY  PT (By licensed PT),OT (By licensed OT)     PT Frequency: 5x a week OT Frequency: 5x a week            Contractures Contractures Info: Not present    Additional Factors Info  Code Status,Allergies,Psychotropic Code Status Info: Full Allergies Info: Acetaminophen, Cefazolinp, Ciprofloxacin,   Ibuprofen,  Sulfa Antibiotics, Naproxen Amoxicillin Psychotropic Info: Abilify, Wellbutrin, Prozac         Current Medications (11/01/2020):  This is the current hospital active medication list Current Facility-Administered Medications  Medication Dose Route Frequency Provider Last Rate Last Admin  . ARIPiprazole (ABILIFY) tablet 10 mg  10 mg Oral QHS Alexandria Lodge, MD   10 mg at 10/31/20 2247  . buPROPion (WELLBUTRIN XL) 24 hr tablet 300 mg  300 mg Oral Daily Alexandria Lodge, MD   300 mg at 11/01/20 0913  . Chlorhexidine Gluconate Cloth 2 % PADS 6 each  6 each Topical Daily Oda Kilts, MD   6 each at 11/01/20 [phone]  . diclofenac sodium (VOLTAREN) 1 % transdermal gel 2 g  2 g Topical BID PRN Maudie Mercury, MD   2 g at 10/31/20 0959  . enoxaparin (LOVENOX) injection 40 mg  40 mg Subcutaneous Q24H Maudie Mercury, MD   40 mg at 11/01/20 0913  . ezetimibe (ZETIA) tablet 10 mg  10 mg Oral  q morning Alexandria Lodge, MD   10 mg at 11/01/20 9211  . FLUoxetine (PROZAC) capsule 40 mg  40 mg Oral Daily Alexandria Lodge, MD   40 mg at 11/01/20 0913  . furosemide (LASIX) tablet 40 mg  40 mg Oral Daily Gaylan Gerold, DO   40 mg at 11/01/20 0913  . guaiFENesin (ROBITUSSIN) 100 MG/5ML solution 300 mg  15 mL Oral Q6H Skeet Simmer, RPH   300 mg at 11/01/20 1212  . hydrALAZINE (APRESOLINE) tablet 25 mg  25 mg Oral Q6H Alexandria Lodge, MD   25 mg at 11/01/20 1210  .  hydrocortisone (ANUSOL-HC) 2.5 % rectal cream   Rectal BID PRN Lucious Groves, DO   Given at 10/30/20 2142  . lactulose (CHRONULAC) 10 GM/15ML solution 30 g  30 g Oral QID Gaylan Gerold, DO      . lidocaine (LIDODERM) 5 % 1 patch  1 patch Transdermal Q24H Alexandria Lodge, MD   1 patch at 10/31/20 1959  . lip balm (CARMEX) ointment   Topical PRN Thurnell Lose, MD   1 application at 94/17/40 2218  . pantoprazole (PROTONIX) EC tablet 40 mg  40 mg Oral Daily Maudie Mercury, MD   40 mg at 11/01/20 0913  . polyethylene glycol (MIRALAX / GLYCOLAX) packet 17 g  17 g Oral Daily PRN Maudie Mercury, MD      . potassium chloride (KLOR-CON) packet 40 mEq  40 mEq Oral BID Gaylan Gerold, DO   40 mEq at 11/01/20 0913  . rifaximin (XIFAXAN) tablet 200 mg  200 mg Oral BID Rehman, Areeg N, DO   200 mg at 11/01/20 0914  . spironolactone (ALDACTONE) tablet 100 mg  100 mg Oral Daily Gaylan Gerold, DO   100 mg at 11/01/20 8144     Discharge Medications: Please see discharge summary for a list of discharge medications.  Relevant Imaging Results:  Relevant Lab Results:   Additional Information SSN# [id number], COVID positive 2/18 now off percautions/now vaccinated  Reece Agar, [place]

## 2020-11-02 ENCOUNTER — Other Ambulatory Visit: Payer: Self-pay | Admitting: Student

## 2020-11-02 DIAGNOSIS — K7460 Unspecified cirrhosis of liver: Secondary | ICD-10-CM

## 2020-11-02 LAB — BASIC METABOLIC PANEL
Anion gap: 10 (ref 5–15)
BUN: 16 mg/dL (ref 8–23)
CO2: 16 mmol/L — ABNORMAL LOW (ref 22–32)
Calcium: 9.8 mg/dL (ref 8.9–10.3)
Chloride: 105 mmol/L (ref 98–111)
Creatinine, Ser: 1.29 mg/dL — ABNORMAL HIGH (ref 0.44–1.00)
GFR, Estimated: 43 mL/min — ABNORMAL LOW (ref >60)
Glucose, Bld: 118 mg/dL — ABNORMAL HIGH (ref 70–99)
Potassium: 5.3 mmol/L — ABNORMAL HIGH (ref 3.5–5.1)
Sodium: 131 mmol/L — ABNORMAL LOW (ref 135–145)

## 2020-11-02 LAB — CBC
HCT: 34 % — ABNORMAL LOW (ref 36.0–46.0)
Hemoglobin: 10.9 g/dL — ABNORMAL LOW (ref 12.0–15.0)
MCH: 28 pg (ref 26.0–34.0)
MCHC: 32.1 g/dL (ref 30.0–36.0)
MCV: 87.4 fL (ref 80.0–100.0)
Platelets: 211 10*3/uL (ref 150–400)
RBC: 3.89 MIL/uL (ref 3.87–5.11)
RDW: 21.9 % — ABNORMAL HIGH (ref 11.5–15.5)
WBC: 5.3 10*3/uL (ref 4.0–10.5)
nRBC: 0.4 % — ABNORMAL HIGH (ref 0.0–0.2)

## 2020-11-02 MED ORDER — FUROSEMIDE 20 MG PO TABS: 20.0000 mg | ORAL_TABLET | Freq: Every day | ORAL | Status: DC

## 2020-11-02 MED ORDER — SPIRONOLACTONE 100 MG PO TABS: 100.0000 mg | ORAL_TABLET | Freq: Every day | ORAL | 0 refills | Status: DC

## 2020-11-02 MED ORDER — LACTULOSE 10 GM/15ML PO SOLN: 30.0000 g | Freq: Four times a day (QID) | ORAL | 0 refills | Status: DC

## 2020-11-02 MED ORDER — RIFAXIMIN 550 MG PO TABS: 550.0000 mg | ORAL_TABLET | Freq: Two times a day (BID) | ORAL | Status: DC

## 2020-11-02 NOTE — Progress Notes (Signed)
Occupational Therapy Treatment Patient Details Name: Geral Tuch MRN: [id number] DOB: 1946-03-11 Today's Date: 11/02/2020    History of present illness Pt is a 75 y.o. female admitted from [place] on 10/22/20 with AMS and cough; incidental (+) COVID-19. [place] for hepatic encephalopathy from missing lactulose meds for at least a week. PMH includes [name] cirrhosis, hepatic encephalopathy, CAD, HTN, DM2, CKD 3, vulva CA, bipolar disorder.   OT comments  Pt making steady progress toward goals. Mod A with bed mobility; Mod A with LB ADL and able to complete sit - stand with min A and Mod mulitpmodal cues. Cognitioin improved from intitial eval. Continue to recommend SNF for rehab.   Follow Up Recommendations  SNF    Equipment Recommendations  3 in 1 bedside commode    Recommendations for Other Services      Precautions / Restrictions Precautions Precautions: Fall;Other (comment) Precaution Comments: painful feet with difficulty weight bearing today       Mobility Bed Mobility Overal bed mobility: Needs Assistance       Supine to sit: Mod assist     General bed mobility comments: Mod to to move leg off bed adn scoot hips to EOB    Transfers Overall transfer level: Needs assistance Equipment used: Rolling walker (2 wheeled)   Sit to Stand: Min assist              Balance     Sitting balance-Leahy Scale: Fair       Standing balance-Leahy Scale: Poor                             ADL either performed or assessed with clinical judgement   ADL Overall ADL's : Needs assistance/impaired     Grooming: Set up;Sitting   Upper Body Bathing: Set up;Supervision/ safety;Sitting   Lower Body Bathing: Moderate assistance;Sit to/from stand                       Functional mobility during ADLs: +2 for physical assistance;Moderate assistance       Vision       Perception     Praxis      Cognition Arousal/Alertness:  Awake/alert Behavior During Therapy: WFL for tasks assessed/performed Overall Cognitive Status: No family/caregiver present to determine baseline cognitive functioning                     Current Attention Level: Selective Memory: Decreased short-term memory Following Commands: Follows one step commands consistently Safety/Judgement: Decreased awareness of deficits Awareness: Emergent   General Comments: slow processing but more appropriate than previous session        Exercises Exercises: Other exercises Other Exercises Other Exercises: Seated Marching x 20   Shoulder Instructions       General Comments      Pertinent Vitals/ Pain       Pain Assessment: Faces Faces Pain Scale: Hurts even more Pain Location: Bilateral feet Pain Descriptors / Indicators: Discomfort;Guarding Pain Intervention(s): Limited activity within patient's tolerance  Home Living                                          Prior Functioning/Environment              Frequency  Min 2X/week        Progress  Toward Goals  OT Goals(current goals can now be found in the care plan section)  Progress towards OT goals: Progressing toward goals  Acute Rehab OT Goals Patient Stated Goal: "Im ready to go to rehab to work hard to get home" OT Goal Formulation: Patient unable to participate in goal setting Time For Goal Achievement: 11/08/20 Potential to Achieve Goals: Good ADL Goals Pt Will Perform Grooming: with set-up;sitting Pt Will Perform Upper Body Bathing: with set-up;sitting Pt Will Perform Lower Body Bathing: with min assist;sit to/from stand Pt Will Transfer to Toilet: bedside commode;with min assist;ambulating Pt Will Perform Toileting - Clothing Manipulation and hygiene: with min assist;sit to/from stand Additional ADL Goal #1: Pt will demonstrate anticipatory awareness during ADL tasks  Plan Discharge plan remains appropriate    Co-evaluation                  AM-PAC OT "6 Clicks" Daily Activity     Outcome Measure   Help from another person eating meals?: A Little Help from another person taking care of personal grooming?: A Little Help from another person toileting, which includes using toliet, bedpan, or urinal?: A Lot Help from another person bathing (including washing, rinsing, drying)?: A Lot Help from another person to put on and taking off regular upper body clothing?: A Lot Help from another person to put on and taking off regular lower body clothing?: A Lot 6 Click Score: 14    End of Session Equipment Utilized During Treatment: Gait belt  OT Visit Diagnosis: Unsteadiness on feet (R26.81);Other abnormalities of gait and mobility (R26.89);Muscle weakness (generalized) (M62.81);Other symptoms and signs involving cognitive function;Pain Pain - part of body:  (B feet)   Activity Tolerance Patient tolerated treatment well   Patient Left in bed;with call bell/phone within reach;with bed alarm set   Nurse Communication Mobility status        Time: 8182-9937 OT Time Calculation (min): 24 min  Charges: OT General Charges $OT Visit: 1 Visit OT Treatments $Self Care/Home Management : 23-37 mins  Maurie Boettcher, OT/L   Acute OT Clinical Specialist [place] Pager [phone] Office [phone]    [facility] 11/02/2020, 2:34 PM

## 2020-11-02 NOTE — Discharge Summary (Signed)
Name: Sheila [name] MRN: [id number] DOB: Nov 18, 1945 75 y.o. PCP: Chesley Noon, MD  Date of Admission: 10/22/2020  4:19 PM Date of Discharge: 11/02/2020 Attending Physician: Axel Filler, *  Discharge Diagnosis: 1. Principal Problem:   Encephalopathy, hepatic (HCC) Active Problems:   Liver cirrhosis (HCC)   Essential hypertension   Hypokalemia   COVID-19 virus infection   Type 2 diabetes mellitus ([place])  Discharge Medications: Allergies as of 11/02/2020      Reactions   Acetaminophen Other (See Comments)   Patient avoids Tylenol due to fatty liver disease   Cefazolin Other (See Comments)   Caused C-DIF   Ciprofloxacin Hcl Nausea Only   Ibuprofen Other (See Comments)   Diabetic--Concerns about kidneys   Sulfa Antibiotics Hives, Itching   Naproxen Sodium Other (See Comments)   Causes pain in veins   Amoxicillin Diarrhea   Did it involve swelling of the face/tongue/throat, SOB, or low BP? No Did it involve sudden or severe rash/hives, skin peeling, or any reaction on the inside of your mouth or nose? No Did you need to seek medical attention at a hospital or doctor's office? No When did it last happen?several years If all above answers are "NO", may proceed with cephalosporin use.      Medication List    STOP taking these medications   Janumet XR [phone] MG Tb24 Generic drug: SitaGLIPtin-MetFORMIN HCl   metoprolol tartrate 25 MG tablet Commonly known as: LOPRESSOR   mirabegron ER 25 MG Tb24 tablet Commonly known as: MYRBETRIQ   nadolol 40 MG tablet Commonly known as: Corgard   telmisartan-hydrochlorothiazide 80-25 MG tablet Commonly known as: MICARDIS HCT     TAKE these medications   ARIPiprazole 10 MG tablet Commonly known as: ABILIFY Take 10 mg by mouth at bedtime.   buPROPion 300 MG 24 hr tablet Commonly known as: WELLBUTRIN XL Take 300 mg by mouth daily.   Calcium 500-125 MG-UNIT Tabs Take 2 tablets by mouth daily.   clobetasol  cream 0.05 % Commonly known as: TEMOVATE Apply 1 application topically daily as needed for rash.   diclofenac sodium 1 % Gel Commonly known as: VOLTAREN Apply 2 g topically 2 (two) times daily as needed for pain.   ezetimibe 10 MG tablet Commonly known as: ZETIA Take 10 mg by mouth every morning.   FLUoxetine 40 MG capsule Commonly known as: PROZAC Take 40 mg by mouth daily.   furosemide 20 MG tablet Commonly known as: LASIX Take 20 mg by mouth daily.   hydrALAZINE 10 MG tablet Commonly known as: APRESOLINE Take 10 mg by mouth 4 (four) times daily.   lactulose 10 GM/15ML solution Commonly known as: CHRONULAC Take 45 mLs (30 g total) by mouth 4 (four) times daily. What changed: when to take this   omeprazole 40 MG capsule Commonly known as: PRILOSEC Take 40 mg by mouth daily.   spironolactone 100 MG tablet Commonly known as: ALDACTONE Take 1 tablet (100 mg total) by mouth daily. Start taking on: November 03, 2020   Systane 0.4-0.3 % Soln Generic drug: Polyethyl Glycol-Propyl Glycol Place 1 drop into both eyes 2 (two) times daily.            Durable Medical Equipment  (From admission, onward)         Start     Ordered   11/02/20 1447  DME 3-in-1  Once        11/02/20 1449          Disposition  and follow-up:   Ms.Nitzia Minish was discharged from [facility] in Stable condition.  At the hospital follow up visit please address:  1.  Hepatic encephalopathy 2/2 cirrhosis 2/2 NASH - Patient's mentation returned to baseline prior to discharge. She was discharged with lactulose 30g four times daily. Patient was started on rifaximin during hospitalization but discontinued prior to discharge due to cost. Nadolol was discontinued. Consider appropriateness of current medication regimen and titrate lactulose to 2-3 bowel movements daily.  Urinary retention - Patient had urinary retention during hospitalization and failed voiding trial. Patient's  Mybetriq discontinued and patietn discharged with Foley catheter with recommendation to obtain urology referral for further evaluation and management.  Hypertension/HFpEF - Patient started on spironolactone 112m daily, continued furosemide 242mdaily and had home telmisartan-HCTZ and metoprolol discontinued. Consider appropriateness of current regimen.  T2DM - Patient's janumet discontinued during hospitalization without hyperglycemia while admitted. Evaluate patient for need for medication for this condition without use of metformin.  2.  Labs / imaging needed at time of follow-up: CBC, CMP  3.  Pending labs/ test needing follow-up: None  Follow-up Appointments:  Contact information for follow-up providers    BaChesley NoonMD. Schedule an appointment as soon as possible for a visit in 1 week(s).   Specialty: Family Medicine Contact information: [phone]      TuSueanne MargaritaMD .   Specialty: Cardiology Contact information: [phone]. [phone]            Contact information for after-discharge care    Destination    [facility] Preferred SNF .   Service: Skilled Nursing Contact information: [id number] [facility] problem list: Hepatic encephalopathy 2/2 cirrhosis 2/2 NASH: Patient presented with altered mental status following nonadherence to lactulose for several days prior to arrival. Patient was restarted on lactulose with improvement of her mentation to her baseline. Patient's lactulose was titrated up to a dose of 30g four times daily in an attempt to achieve 2-3 bowel movements per day. Patient was started on rifaximin during hospitalization but discontinued prior to discharge due to cost. Patient was started on spironolactone 5052maily and effectively titrated up to  100m85mily.  Urinary retention - Patient had urinary retention during hospitalization and failed voiding trial. Patient's urinalysis was unremarkable and urine culture revealed lactobacillus, however overall presentation determined not to be secondary to urinary tract infection. Patient's Mybetriq discontinued and patietn discharged with Foley catheter with recommendation to obtain urology referral for further evaluation and management.   Hypertension/HFpEF - Patient started on spironolactone 100mg19mly, continued furosemide daily and hydralazine Q6H and had home telmisartan-HCTZ and metoprolol discontinued.  T2DM - Patient's janumet discontinued during hospitalization without hyperglycemia while admitted.  COVID-19 infection - Patient asymptomatic for this condition during hospitalization and completed 10-days of isolation.  Pertinent Labs, Studies, and Procedures:  CBC Latest Ref Rng & Units 11/02/2020 11/01/2020 10/31/2020  WBC 4.0 - 10.5 K/uL 5.3 4.9 5.0  Hemoglobin 12.0 - 15.0 g/dL 10.9(L) 10.6(L) 10.1(L)  Hematocrit 36.0 - 46.0 % 34.0(L) 32.4(L) 30.4(L)  Platelets 150 - 400 K/uL 211 210 184   CMP Latest Ref Rng & Units 11/02/2020 10/31/2020 10/30/2020  Glucose 70 - 99 mg/dL 118(H) 129(H) 116(H)  BUN 8 - 23  mg/dL 16 13 13   Creatinine 0.44 - 1.00 mg/dL 1.29(H) 0.98 0.93  Sodium 135 - 145 mmol/L 131(L) 136 133(L)  Potassium 3.5 - 5.1 mmol/L 5.3(H) 4.8 4.5  Chloride 98 - 111 mmol/L 105 109 109  CO2 22 - 32 mmol/L 16(L) 19(L) 17(L)  Calcium 8.9 - 10.3 mg/dL 9.8 9.6 9.3  Total Protein 6.5 - 8.1 g/dL - - -  Total Bilirubin 0.3 - 1.2 mg/dL - - -  Alkaline Phos 38 - 126 U/L - - -  AST 15 - 41 U/L - - -  ALT 0 - 44 U/L - - -  CT Head Wo Contrast  Result Date: 10/22/2020 CLINICAL DATA:  Altered mental status EXAM: CT HEAD WITHOUT CONTRAST TECHNIQUE: Contiguous axial images were obtained from the base of the skull through the vertex without intravenous contrast. COMPARISON:  CT brain 02/09/2020  FINDINGS: Brain: No acute territorial infarction, hemorrhage, or intracranial mass. Moderate atrophy. Moderate hypodensity in the white matter consistent with chronic small vessel ischemic change. Stable ventricle size. Vascular: No hyperdense vessels. Vertebral and carotid vascular calcification Skull: No fracture. Stable thinning of the bilateral parietal bones. Sinuses/Orbits: No acute finding. Other: None IMPRESSION: 1. No CT evidence for acute intracranial abnormality. 2. Atrophy and chronic small vessel ischemic changes of the white matter. Electronically Signed   By: Donavan Foil M.D.   On: 10/22/2020 18:14   DG Chest Port 1 View  Addendum Date: 10/22/2020   ADDENDUM REPORT: 10/22/2020 21:56 ADDENDUM: For impression labeled #1 it should state that there are findings of vascular congestion with small bilateral pleural effusions. The word "glued" is an error. Electronically Signed   By: Constance Holster M.D.   On: 10/22/2020 21:56   Result Date: 10/22/2020 CLINICAL DATA:  Shortness of breath EXAM: PORTABLE CHEST 1 VIEW COMPARISON:  February 09, 2020 FINDINGS: There is persistent cardiomegaly and vascular calcifications of the thoracic aorta. There is no pneumothorax. There is vascular congestion. There are trace to small bilateral pleural effusions. There is an unchanged appearance of the upper mediastinum. IMPRESSION: 1. Cardiomegaly with findings of vascular congestion glued in small bilateral pleural effusions. 2. Relatively widened upper mediastinum, not substantially changed from prior study. These results were called by telephone at the time of interpretation on 10/22/2020 at 7:12 pm to provider CLAUDIA GIBBONS , who verbally acknowledged these results. Electronically Signed: By: Constance Holster M.D. On: 10/22/2020 19:12   ECHOCARDIOGRAM COMPLETE  Result Date: 10/20/2020    ECHOCARDIOGRAM REPORT   Patient Name:   AARIONA MOMON Date of Exam: 10/19/2020 Medical Rec #:  [id number]     Height:        65.0 in Accession #:    [id number]    Weight:       191.0 lb Date of Birth:  Jun 25, 1946     BSA:          1.940 m Patient Age:    69 years      BP:           198/76 mmHg Patient Gender: F             HR:           61 bpm. Exam Location:  Outpatient Procedure: 2D Echo, Cardiac Doppler and Color Doppler Indications:    I31.3 Pericardial effusion  History:        Patient has prior history of Echocardiogram examinations, most  recent 05/23/2019.  Sonographer:    Tiffany Dance Referring Phys: [id number] [place]  1. Left ventricular ejection fraction, by estimation, is 50 to 55%. The left ventricle has low normal function. The left ventricle has no regional wall motion abnormalities. There is mild concentric left ventricular hypertrophy. Left ventricular diastolic parameters are consistent with Grade II diastolic dysfunction (pseudonormalization). Elevated left ventricular end-diastolic pressure.  2. Right ventricular systolic function is normal. The right ventricular size is normal. There is normal pulmonary artery systolic pressure.  3. Left atrial size was severely dilated.  4. The mitral valve is normal in structure. Trivial mitral valve regurgitation. No evidence of mitral stenosis.  5. The aortic valve is tricuspid. Aortic valve regurgitation is mild. No aortic stenosis is present.  6. The inferior vena cava is normal in size with greater than 50% respiratory variability, suggesting right atrial pressure of 3 mmHg. FINDINGS  Left Ventricle: Left ventricular ejection fraction, by estimation, is 50 to 55%. The left ventricle has low normal function. The left ventricle has no regional wall motion abnormalities. The left ventricular internal cavity size was normal in size. There is mild concentric left ventricular hypertrophy. Left ventricular diastolic parameters are consistent with Grade II diastolic dysfunction (pseudonormalization). Elevated left ventricular end-diastolic pressure. Right  Ventricle: The right ventricular size is normal. No increase in right ventricular wall thickness. Right ventricular systolic function is normal. There is normal pulmonary artery systolic pressure. The tricuspid regurgitant velocity is 2.87 m/s, and  with an assumed right atrial pressure of 3 mmHg, the estimated right ventricular systolic pressure is 15.7 mmHg. Left Atrium: Left atrial size was severely dilated. Right Atrium: Right atrial size was normal in size. Pericardium: There is no evidence of pericardial effusion. Mitral Valve: The mitral valve is normal in structure. Trivial mitral valve regurgitation. No evidence of mitral valve stenosis. Tricuspid Valve: The tricuspid valve is normal in structure. Tricuspid valve regurgitation is mild . No evidence of tricuspid stenosis. Aortic Valve: The aortic valve is tricuspid. Aortic valve regurgitation is mild. Aortic regurgitation PHT measures 657 msec. No aortic stenosis is present. Pulmonic Valve: The pulmonic valve was normal in structure. Pulmonic valve regurgitation is not visualized. No evidence of pulmonic stenosis. Aorta: The aortic root is normal in size and structure. Venous: The inferior vena cava is normal in size with greater than 50% respiratory variability, suggesting right atrial pressure of 3 mmHg. IAS/Shunts: No atrial level shunt detected by color flow Doppler.  LEFT VENTRICLE PLAX 2D LVIDd:         4.70 cm  Diastology LVIDs:         3.50 cm  LV e' medial:    5.55 cm/s LV PW:         1.20 cm  LV E/e' medial:  22.3 LV IVS:        1.30 cm  LV e' lateral:   8.59 cm/s LVOT diam:     1.80 cm  LV E/e' lateral: 14.4 LV SV:         101 LV SV Index:   52 LVOT Area:     2.54 cm  RIGHT VENTRICLE             IVC RV Basal diam:  2.90 cm     IVC diam: 2.20 cm RV S prime:     10.30 cm/s TAPSE (M-mode): 2.3 cm LEFT ATRIUM              Index  RIGHT ATRIUM           Index LA diam:        5.30 cm  2.73 cm/m  RA Area:     19.10 cm LA Vol (A2C):   135.0 ml  69.59 ml/m RA Volume:   49.90 ml  25.72 ml/m LA Vol (A4C):   94.8 ml  48.87 ml/m LA Biplane Vol: 121.0 ml 62.38 ml/m  AORTIC VALVE LVOT Vmax:   148.00 cm/s LVOT Vmean:  94.800 cm/s LVOT VTI:    0.396 m AI PHT:      657 msec  AORTA Ao Root diam: 3.30 cm Ao Asc diam:  3.40 cm MITRAL VALVE                TRICUSPID VALVE MV Area (PHT): 3.12 cm     TR Peak grad:   32.9 mmHg MV Decel Time: 243 msec     TR Vmax:        287.00 cm/s MV E velocity: 124.00 cm/s MV A velocity: 116.00 cm/s  SHUNTS MV E/A ratio:  1.07         Systemic VTI:  0.40 m                             Systemic Diam: 1.80 cm Skeet Latch MD Electronically signed by Skeet Latch MD Signature Date/Time: 10/20/2020/8:18:37 AM    Final    VAS [place] LOWER EXTREMITY VENOUS (DVT)  Result Date: 10/20/2020  Lower Venous DVT Study Indications: Swelling.  Limitations: Poor ultrasound/tissue interface. Comparison Study: No prior studies. Performing Technologist: Darlin Coco RDMS  Examination Guidelines: A complete evaluation includes B-mode imaging, spectral Doppler, color Doppler, and power Doppler as needed of all accessible portions of each vessel. Bilateral testing is considered an integral part of a complete examination. Limited examinations for reoccurring indications may be performed as noted. The reflux portion of the exam is performed with the patient in reverse Trendelenburg.  +---------+---------------+---------+-----------+----------+-------------------+ RIGHT    CompressibilityPhasicitySpontaneityPropertiesThrombus Aging      +---------+---------------+---------+-----------+----------+-------------------+ CFV      Full           Yes      Yes                                      +---------+---------------+---------+-----------+----------+-------------------+ SFJ      Full                                                             +---------+---------------+---------+-----------+----------+-------------------+ FV Prox   Full                                                             +---------+---------------+---------+-----------+----------+-------------------+ FV Mid   Full                                                             +---------+---------------+---------+-----------+----------+-------------------+  FV DistalFull                                                             +---------+---------------+---------+-----------+----------+-------------------+ PFV      Full                                                             +---------+---------------+---------+-----------+----------+-------------------+ POP      Full           Yes      Yes                                      +---------+---------------+---------+-----------+----------+-------------------+ PTV      Full                                                             +---------+---------------+---------+-----------+----------+-------------------+ PERO                                                  Not well visualized +---------+---------------+---------+-----------+----------+-------------------+   +---------+---------------+---------+-----------+----------+-------------------+ LEFT     CompressibilityPhasicitySpontaneityPropertiesThrombus Aging      +---------+---------------+---------+-----------+----------+-------------------+ CFV      Full           Yes      Yes                                      +---------+---------------+---------+-----------+----------+-------------------+ SFJ      Full                                                             +---------+---------------+---------+-----------+----------+-------------------+ FV Prox  Full                                                             +---------+---------------+---------+-----------+----------+-------------------+ FV Mid   Full                                                              +---------+---------------+---------+-----------+----------+-------------------+ FV DistalFull                                                             +---------+---------------+---------+-----------+----------+-------------------+  PFV      Full                                                             +---------+---------------+---------+-----------+----------+-------------------+ POP      Full                                                             +---------+---------------+---------+-----------+----------+-------------------+ PTV      Full                                                             +---------+---------------+---------+-----------+----------+-------------------+ PERO     Full                                         Some segments not                                                         well visualized     +---------+---------------+---------+-----------+----------+-------------------+     Summary: RIGHT: - There is no evidence of deep vein thrombosis in the lower extremity. However, portions of this examination were limited- see technologist comments above.  - No cystic structure found in the popliteal fossa.  LEFT: - There is no evidence of deep vein thrombosis in the lower extremity. However, portions of this examination were limited- see technologist comments above.  - No cystic structure found in the popliteal fossa.  *See table(s) above for measurements and observations. Electronically signed by Harold Barban MD on 10/20/2020 at 9:50:42 PM.    Final    US Abdomen Limited RUQ (LIVER/GB)  Result Date: 10/06/2020 CLINICAL DATA:  NASH. EXAM: ULTRASOUND ABDOMEN LIMITED RIGHT UPPER QUADRANT COMPARISON:  03/12/2020 FINDINGS: Gallbladder: Surgically absent. Common bile duct: Diameter: 9 mm Liver: Echogenic with markedly nodular contour, findings consistent with cirrhosis. No focal intrahepatic parenchymal mass lesion evident. Portal vein is  patent on color Doppler imaging with normal direction of blood flow towards the liver. Other: Small volume ascites identified in the right upper quadrant. IMPRESSION: 1. Cirrhotic liver morphology without focal intraparenchymal mass lesion evident. 2. Dilated common bile duct, stable since prior ultrasound of 03/12/2020. 3. Status post cholecystectomy. 4. Small volume ascites identified right upper quadrant. Electronically Signed   By: Misty Stanley M.D.   On: 10/06/2020 14:39   Discharge Instructions: Discharge Instructions    Call MD for:  difficulty breathing, headache or visual disturbances   Complete by: As directed    Call MD for:  extreme fatigue   Complete by: As directed    Call MD for:  hives   Complete by: As directed  Call MD for:  persistant dizziness or light-headedness   Complete by: As directed    Call MD for:  persistant nausea and vomiting   Complete by: As directed    Call MD for:  redness, tenderness, or signs of infection (pain, swelling, redness, odor or green/yellow discharge around incision site)   Complete by: As directed    Call MD for:  severe uncontrolled pain   Complete by: As directed    Call MD for:  temperature >100.4   Complete by: As directed    Diet - low sodium heart healthy   Complete by: As directed    Discharge instructions   Complete by: As directed    Ms. Houseman, it was a pleasure meeting you during your recent hospitalization. You were admitted for hepatic encephalopathy and also found to test positive for COVID-19. You will be discharged to [facility] for further rehabilitation. Please reference medication list for most up-to-date prescriptions.   Increase activity slowly   Complete by: As directed      Signed: Cato Mulligan, MD 11/02/2020, 2:51 PM   Pager: [phone]

## 2020-11-02 NOTE — Progress Notes (Signed)
Ok to change rifaximin to 567m PO BID per Dr. JWynetta Emeryfor hepatic encephalopathy. Pharmacy tech JWillette Almawill start the prior authorization process through insurance.   MOnnie Boer PharmD, BCIDP, AAHIVP, CPP Infectious Disease Pharmacist 11/02/2020 12:01 PM

## 2020-11-02 NOTE — TOC Transition Note (Addendum)
Transition of Care [facility]) - CM/SW Discharge Note   Patient Details  Name: Sheila [name] MRN: [id number] Date of Birth: 1945-11-16  [facility]) CM/SW Contact:  Tresa Endo Phone Number: 11/02/2020, 3:24 PM   Clinical Narrative:    Patient will DC to: [facility]  Anticipated DC date: 11/02/2020 Family notified: Patient Oriented  Transport by: Corey Harold   Per MD patient ready for DC to [facility]. RN to call report prior to discharge [phone]) [phone] Room 208. RN, patient, patient's family, and facility notified of DC. Discharge Summary and FL2 sent to facility. DC packet on chart. Ambulance transport requested for patient.   CSW will sign off for now as social work intervention is no longer needed. Please consult [place] again if new needs arise.     Final next level of care: Skilled Nursing Facility Barriers to Discharge: Barriers Resolved   Patient Goals and CMS Choice Patient states their goals for this hospitalization and ongoing recovery are:: SNF for Rehab CMS Medicare.gov Compare Post Acute Care list provided to:: Patient Choice offered to / list presented to : Patient  Discharge Placement              Patient chooses bed at: [facility] Patient to be transferred to facility by: [place] Name of family member notified: Imari Reen (Husband) [phone] Patient and family notified of of transfer: 11/02/20  Discharge Plan and Services In-house Referral: Clinical Social Work   Post Acute Care Choice: Resumption of Svcs/PTA Provider                               Social Determinants of Health (SDOH) Interventions     Readmission Risk Interventions Readmission Risk Prevention Plan 05/15/2019  Post Dischage Appt Complete  Medication Screening Complete  Transportation Screening Complete

## 2020-11-02 NOTE — Plan of Care (Signed)
Patient is currently resting in bed. C/o nausea, given phenergan supp. Nausea improved. Had the patient eat snacks due to poor PO intake throughout the day. Nausea resolved patient able to take some medications. No BM overnight. Patient states, she feels like she has to go like she is constipated. Given miralax. No results since. Foley intact. VSS. Call bell within reach. Bed alarm on.   Problem: Education: Goal: Knowledge of General Education information will improve Description: Including pain rating scale, medication(s)/side effects and non-pharmacologic comfort measures Outcome: Progressing   Problem: Clinical Measurements: Goal: Ability to maintain clinical measurements within normal limits will improve Outcome: Progressing Goal: Diagnostic test results will improve Outcome: Progressing   Problem: Activity: Goal: Risk for activity intolerance will decrease Outcome: Progressing   Problem: Education: Goal: Knowledge of risk factors and measures for prevention of condition will improve Outcome: Progressing   Problem: Coping: Goal: Psychosocial and spiritual needs will be supported Outcome: Progressing

## 2020-11-02 NOTE — TOC Progression Note (Signed)
Transition of Care [facility]) - Progression Note    Patient Details  Name: Sheila [name] MRN: [id number] Date of Birth: [birth year]  Transition of Care [facility]) CM/SW [place], LCSW Phone Number: 11/02/2020, 9:11 AM  Clinical Narrative:    CSW spoke with [facility]. They will be able to accept patient today but are requesting date of patient's COVID booster. CSW will follow up.    Expected Discharge Plan: [place] Barriers to Discharge: Continued Medical Work up  Expected Discharge Plan and Services Expected Discharge Plan: [place] In-house Referral: Clinical Social Work   Post Acute Care Choice: Resumption of Svcs/PTA Provider Living arrangements for the past 2 months: [place]                                       Social Determinants of Health (SDOH) Interventions    Readmission Risk Interventions Readmission Risk Prevention Plan 05/15/2019  Post Dischage Appt Complete  Medication Screening Complete  Transportation Screening Complete

## 2020-11-02 NOTE — TOC Progression Note (Signed)
Transition of Care [facility]) - Progression Note    Patient Details  Name: Sheila [name] MRN: [id number] Date of Birth: [birth year]  Transition of Care [facility]) CM/SW [place], [place] Phone Number: 11/02/2020, 9:21 AM  Clinical Narrative:    9:03am CSW spoke with patient about discharge facility ([facility]) per her request, acceptance is pending on COVID booster proof. Patient thinks her COVID card is in her purse and is waiting for her nurse to assist her with finding her belongings.      Expected Discharge Plan: [place] Barriers to Discharge: Continued Medical Work up  Expected Discharge Plan and Services Expected Discharge Plan: [place] In-house Referral: Clinical Social Work   Post Acute Care Choice: Resumption of Svcs/PTA Provider Living arrangements for the past 2 months: [place]                                       Social Determinants of Health (SDOH) Interventions    Readmission Risk Interventions Readmission Risk Prevention Plan 05/15/2019  Post Dischage Appt Complete  Medication Screening Complete  Transportation Screening Complete

## 2020-11-02 NOTE — TOC Benefit Eligibility Note (Signed)
Patient Advocate Encounter  Prior Authorization for Xifaxan 550 mg has been approved.    PA# [id number] Effective dates: 11/02/2020 through 09/03/2021  Patients co-pay is $945.46.     Lyndel Safe, [place] Patient Advocate Specialist [place] Antimicrobial Stewardship Team Direct Number: [phone]  Fax: [phone]

## 2020-11-02 NOTE — Progress Notes (Signed)
Patient discharged in stable condition, transported by [facility].  All belongings sent with patient.

## 2020-11-02 NOTE — Plan of Care (Signed)
  Problem: Education: Goal: Knowledge of General Education information will improve Description: Including pain rating scale, medication(s)/side effects and non-pharmacologic comfort measures Outcome: Adequate for Discharge   Problem: Clinical Measurements: Goal: Ability to maintain clinical measurements within normal limits will improve Outcome: Adequate for Discharge Goal: Diagnostic test results will improve Outcome: Adequate for Discharge   Problem: Activity: Goal: Risk for activity intolerance will decrease Outcome: Adequate for Discharge   Problem: Education: Goal: Knowledge of risk factors and measures for prevention of condition will improve Outcome: Adequate for Discharge   Problem: Coping: Goal: Psychosocial and spiritual needs will be supported Outcome: Adequate for Discharge

## 2020-11-02 NOTE — Progress Notes (Signed)
Subjective:  Overnight, patient endorsed nausea despite ondansetron and given rectal phenergan.   This morning, patient reports that she is feeling much improved but continues to endorse weakness. She states that her PO intake has been minimal as she does not enjoy the food offered at the hospital but she is looking forward to ordering a cheeseburger for lunch. She states that her last bowel movement may have been two days ago.  Objective:  Vital signs in last 24 hours: Vitals:   11/01/20 2044 11/01/20 2334 11/02/20 0449 11/02/20 1402  BP: (!) 139/57 (!) 132/50 (!) 137/54 (!) 126/54  Pulse: 78 85 75 81  Resp: 16 18 15 17   Temp: 98.2 F (36.8 C)  98.3 F (36.8 C) 98.2 F (36.8 C)  TempSrc: Oral  Axillary Oral  SpO2: 96% 96% 99% 95%  Weight:   77.8 kg   Height:      On room air  Intake/Output Summary (Last 24 hours) at 11/02/2020 1418 Last data filed at 11/02/2020 1402 Gross per 24 hour  Intake 410 ml  Output 1000 ml  Net -590 ml  Cumulative Net: -3,640m  Filed Weights   10/30/20 0419 10/31/20 0545 11/02/20 0449  Weight: 77.2 kg 77.7 kg 77.8 kg  Weight on admission: 81.8kg  Physical Exam Vitals reviewed.  Constitutional:      General: She is not in acute distress.    Appearance: She is not ill-appearing.  Cardiovascular:     Rate and Rhythm: Normal rate and regular rhythm.     Pulses: Normal pulses.     Heart sounds: Normal heart sounds.  Pulmonary:     Effort: Pulmonary effort is normal.     Breath sounds: Normal breath sounds.  Abdominal:     General: Abdomen is flat. Bowel sounds are normal.     Palpations: Abdomen is soft.     Tenderness: There is no abdominal tenderness.  Musculoskeletal:     Right lower leg: No edema.     Left lower leg: No edema.  Neurological:     General: No focal deficit present.     Mental Status: Mental status is at baseline.  Psychiatric:        Mood and Affect: Mood normal.        Behavior: Behavior normal.    Labs in last  24 hours: CBC Latest Ref Rng & Units 11/02/2020 11/01/2020 10/31/2020  WBC 4.0 - 10.5 K/uL 5.3 4.9 5.0  Hemoglobin 12.0 - 15.0 g/dL 10.9(L) 10.6(L) 10.1(L)  Hematocrit 36.0 - 46.0 % 34.0(L) 32.4(L) 30.4(L)  Platelets 150 - 400 K/uL 211 210 184   BMP Latest Ref Rng & Units 11/02/2020 10/31/2020 10/30/2020  Glucose 70 - 99 mg/dL 118(H) 129(H) 116(H)  BUN 8 - 23 mg/dL 16 13 13   Creatinine 0.44 - 1.00 mg/dL 1.29(H) 0.98 0.93  Sodium 135 - 145 mmol/L 131(L) 136 133(L)  Potassium 3.5 - 5.1 mmol/L 5.3(H) 4.8 4.5  Chloride 98 - 111 mmol/L 105 109 109  CO2 22 - 32 mmol/L 16(L) 19(L) 17(L)  Calcium 8.9 - 10.3 mg/dL 9.8 9.6 9.3   Imaging in last 24 hours: No results found.  Assessment/Plan:  Principal Problem:   Encephalopathy, hepatic (HCC) Active Problems:   Liver cirrhosis (HCC)   Essential hypertension   Hypokalemia   COVID-19 virus infection   Type 2 diabetes mellitus (HCC)  NNeilah Fulwideris a 75year old female with past medical history significant for cirrhosis 2/2 NASH with hepatic encephalopathy, CAD, HTN, T2DM, CKD3a,  overactive bladder, and bipolar disorder who presented to [facility] on 10/22/2020 for evaluation of altered mental status found to have hepatic encephalopathy secondary to nonadherence to lactulose and COVID-19 infection. Since admission, patient's mental status returned to baseline following resumption of prescribed lactulose. Patient off COVID-19 precautions as of yesterday and medically stable for discharge to SNF.  #Hepatic encephalopathy, resolved #Cirrhosis 2/2 NASH, chronic Patient's mental status is at her baseline today. She is prescribed lactulose 30g four times daily with plan for 2-3 bowel movements daily. Patient underwent prior authorization for rifaximin, however medication cost would be $950/month so this medication will be discontinued.  -Continue lactulose 30g four times daily -Discontinued rifaximin and nadolol -PT/OT recommended SNF, patient amenable to this  plan.  #Urinary retention, active Patient continues to have ongoing urinary retention with failing of voiding trial. Urinalysis unremarkable but urine culture grew Lactobacillus. -Continue holding Myrbetrig -Discharge with Foley catheter in place -Urology referral after discharge  Normocytic anemia Vitamin B12 deficiency Iron deficiency anemia Stable.Patient will need an outpatient follow-up with GI for EGD and colonoscopy for her iron deficiency is anemia  #Hypertension, chronic -Continue home hydralazine 25 mg q6h -Continuehome spironolactone 100 mg daily -Continue holding home telmisartan-HCTZ 80-82m daily  #HFpEF, chronic -Continue home fuorsemide 220mdaily -Continue home spironolactone 10073maily -strict I/O, daily weights -Holding home metoprolol  #T2DM, chronic Last A1c 5.3 on 09/27/20. Blood glucose readings on routine labwork in low 100s. -Hold metformin after discharge  #Bipolar disorder, chronic -Continue home aripiprazole 10 mg QHS -Continue home fluoxetine 40 mg daily -Continue home bupropion 300 mg daily  #COVID-19 infection, resolved Patient completed 10 days of isolation for her COVID-19 infection.  #Code status: Full code #Diet: Regular #IVF: None #VTE ppx: Enoxaparin [id number]  JohnCato Mulligan 11/02/2020, 2:18 PM Pager: [phone] 5pm on weekdays and 1pm on weekends: On Call pager [phone]

## 2020-11-03 NOTE — Progress Notes (Signed)
Spoke to patient made her aware you spoke to her GI doctor and patient is f/u with [place]

## 2020-11-04 ENCOUNTER — Ambulatory Visit: Payer: Medicare Other | Admitting: Cardiology

## 2020-11-10 NOTE — Progress Notes (Signed)
Called and spoke with pt, pt is in the hospital but will give [place] a call to schedule an appointment at a later date.

## 2020-11-10 NOTE — Progress Notes (Signed)
Called and spoke with pt regarding DVT results. Pt voiced understanding.

## 2020-12-04 ENCOUNTER — Emergency Department (HOSPITAL_COMMUNITY): Payer: Medicare Other

## 2020-12-04 ENCOUNTER — Other Ambulatory Visit: Payer: Self-pay

## 2020-12-04 ENCOUNTER — Inpatient Hospital Stay (HOSPITAL_COMMUNITY)
Admission: EM | Admit: 2020-12-04 | Discharge: 2020-12-09 | DRG: 441 | Disposition: A | Discharge status: Hospice | Payer: Medicare Other | Attending: Family Medicine | Admitting: Family Medicine

## 2020-12-04 DIAGNOSIS — I5032 Chronic diastolic (congestive) heart failure: Secondary | ICD-10-CM | POA: Diagnosis present

## 2020-12-04 DIAGNOSIS — F319 Bipolar disorder, unspecified: Secondary | ICD-10-CM | POA: Diagnosis present

## 2020-12-04 DIAGNOSIS — N281 Cyst of kidney, acquired: Secondary | ICD-10-CM | POA: Diagnosis present

## 2020-12-04 DIAGNOSIS — E785 Hyperlipidemia, unspecified: Secondary | ICD-10-CM | POA: Diagnosis present

## 2020-12-04 DIAGNOSIS — Z6829 Body mass index (BMI) 29.0-29.9, adult: Secondary | ICD-10-CM

## 2020-12-04 DIAGNOSIS — E871 Hypo-osmolality and hyponatremia: Secondary | ICD-10-CM | POA: Diagnosis present

## 2020-12-04 DIAGNOSIS — R4182 Altered mental status, unspecified: Secondary | ICD-10-CM

## 2020-12-04 DIAGNOSIS — K529 Noninfective gastroenteritis and colitis, unspecified: Secondary | ICD-10-CM | POA: Diagnosis present

## 2020-12-04 DIAGNOSIS — K7581 Nonalcoholic steatohepatitis (NASH): Secondary | ICD-10-CM | POA: Diagnosis not present

## 2020-12-04 DIAGNOSIS — I110 Hypertensive heart disease with heart failure: Secondary | ICD-10-CM | POA: Diagnosis present

## 2020-12-04 DIAGNOSIS — K7682 Hepatic encephalopathy: Secondary | ICD-10-CM

## 2020-12-04 DIAGNOSIS — Y846 Urinary catheterization as the cause of abnormal reaction of the patient, or of later complication, without mention of misadventure at the time of the procedure: Secondary | ICD-10-CM | POA: Diagnosis present

## 2020-12-04 DIAGNOSIS — Z8544 Personal history of malignant neoplasm of other female genital organs: Secondary | ICD-10-CM

## 2020-12-04 DIAGNOSIS — E663 Overweight: Secondary | ICD-10-CM | POA: Diagnosis present

## 2020-12-04 DIAGNOSIS — Z882 Allergy status to sulfonamides status: Secondary | ICD-10-CM

## 2020-12-04 DIAGNOSIS — L89322 Pressure ulcer of left buttock, stage 2: Secondary | ICD-10-CM | POA: Diagnosis present

## 2020-12-04 DIAGNOSIS — L8990 Pressure ulcer of unspecified site, unspecified stage: Secondary | ICD-10-CM | POA: Diagnosis present

## 2020-12-04 DIAGNOSIS — Z20822 Contact with and (suspected) exposure to covid-19: Secondary | ICD-10-CM | POA: Diagnosis present

## 2020-12-04 DIAGNOSIS — B9620 Unspecified Escherichia coli [E. coli] as the cause of diseases classified elsewhere: Secondary | ICD-10-CM | POA: Diagnosis present

## 2020-12-04 DIAGNOSIS — E860 Dehydration: Secondary | ICD-10-CM | POA: Diagnosis present

## 2020-12-04 DIAGNOSIS — Z66 Do not resuscitate: Secondary | ICD-10-CM | POA: Diagnosis not present

## 2020-12-04 DIAGNOSIS — N390 Urinary tract infection, site not specified: Secondary | ICD-10-CM

## 2020-12-04 DIAGNOSIS — D6959 Other secondary thrombocytopenia: Secondary | ICD-10-CM | POA: Diagnosis present

## 2020-12-04 DIAGNOSIS — Z7401 Bed confinement status: Secondary | ICD-10-CM

## 2020-12-04 DIAGNOSIS — R5381 Other malaise: Secondary | ICD-10-CM | POA: Diagnosis not present

## 2020-12-04 DIAGNOSIS — Z886 Allergy status to analgesic agent status: Secondary | ICD-10-CM

## 2020-12-04 DIAGNOSIS — D61818 Other pancytopenia: Secondary | ICD-10-CM | POA: Diagnosis present

## 2020-12-04 DIAGNOSIS — E1169 Type 2 diabetes mellitus with other specified complication: Secondary | ICD-10-CM

## 2020-12-04 DIAGNOSIS — I2510 Atherosclerotic heart disease of native coronary artery without angina pectoris: Secondary | ICD-10-CM | POA: Diagnosis present

## 2020-12-04 DIAGNOSIS — N179 Acute kidney failure, unspecified: Secondary | ICD-10-CM | POA: Diagnosis present

## 2020-12-04 DIAGNOSIS — K7460 Unspecified cirrhosis of liver: Secondary | ICD-10-CM | POA: Diagnosis present

## 2020-12-04 DIAGNOSIS — K7200 Acute and subacute hepatic failure without coma: Secondary | ICD-10-CM | POA: Diagnosis present

## 2020-12-04 DIAGNOSIS — T83511A Infection and inflammatory reaction due to indwelling urethral catheter, initial encounter: Secondary | ICD-10-CM | POA: Diagnosis not present

## 2020-12-04 DIAGNOSIS — R3129 Other microscopic hematuria: Secondary | ICD-10-CM | POA: Diagnosis present

## 2020-12-04 DIAGNOSIS — G9341 Metabolic encephalopathy: Secondary | ICD-10-CM | POA: Diagnosis present

## 2020-12-04 DIAGNOSIS — K7290 Hepatic failure, unspecified without coma: Secondary | ICD-10-CM | POA: Diagnosis present

## 2020-12-04 DIAGNOSIS — L89312 Pressure ulcer of right buttock, stage 2: Secondary | ICD-10-CM | POA: Diagnosis present

## 2020-12-04 DIAGNOSIS — Z880 Allergy status to penicillin: Secondary | ICD-10-CM

## 2020-12-04 DIAGNOSIS — R7989 Other specified abnormal findings of blood chemistry: Secondary | ICD-10-CM | POA: Diagnosis not present

## 2020-12-04 DIAGNOSIS — F419 Anxiety disorder, unspecified: Secondary | ICD-10-CM | POA: Diagnosis present

## 2020-12-04 DIAGNOSIS — Z515 Encounter for palliative care: Secondary | ICD-10-CM | POA: Diagnosis not present

## 2020-12-04 DIAGNOSIS — T83518A Infection and inflammatory reaction due to other urinary catheter, initial encounter: Secondary | ICD-10-CM | POA: Diagnosis present

## 2020-12-04 DIAGNOSIS — E1122 Type 2 diabetes mellitus with diabetic chronic kidney disease: Secondary | ICD-10-CM | POA: Diagnosis present

## 2020-12-04 DIAGNOSIS — K219 Gastro-esophageal reflux disease without esophagitis: Secondary | ICD-10-CM | POA: Diagnosis present

## 2020-12-04 DIAGNOSIS — Z87891 Personal history of nicotine dependence: Secondary | ICD-10-CM

## 2020-12-04 DIAGNOSIS — Z7189 Other specified counseling: Secondary | ICD-10-CM | POA: Diagnosis not present

## 2020-12-04 LAB — URINALYSIS, ROUTINE W REFLEX MICROSCOPIC
Bilirubin Urine: NEGATIVE
Glucose, UA: NEGATIVE mg/dL
Ketones, ur: NEGATIVE mg/dL
Nitrite: POSITIVE — AB
Protein, ur: NEGATIVE mg/dL
Specific Gravity, Urine: 1.011 (ref 1.005–1.030)
WBC, UA: >50 WBC/hpf — ABNORMAL HIGH (ref 0–5)
pH: 5 (ref 5.0–8.0)

## 2020-12-04 LAB — COMPREHENSIVE METABOLIC PANEL
ALT: 31 U/L (ref 0–44)
AST: 48 U/L — ABNORMAL HIGH (ref 15–41)
Albumin: 2.9 g/dL — ABNORMAL LOW (ref 3.5–5.0)
Alkaline Phosphatase: 118 U/L (ref 38–126)
Anion gap: 7 (ref 5–15)
BUN: 34 mg/dL — ABNORMAL HIGH (ref 8–23)
CO2: 22 mmol/L (ref 22–32)
Calcium: 10.4 mg/dL — ABNORMAL HIGH (ref 8.9–10.3)
Chloride: 104 mmol/L (ref 98–111)
Creatinine, Ser: 1.36 mg/dL — ABNORMAL HIGH (ref 0.44–1.00)
GFR, Estimated: 40 mL/min — ABNORMAL LOW (ref >60)
Glucose, Bld: 87 mg/dL (ref 70–99)
Potassium: 4.1 mmol/L (ref 3.5–5.1)
Sodium: 133 mmol/L — ABNORMAL LOW (ref 135–145)
Total Bilirubin: 1.4 mg/dL — ABNORMAL HIGH (ref 0.3–1.2)
Total Protein: 6.7 g/dL (ref 6.5–8.1)

## 2020-12-04 LAB — CBC WITH DIFFERENTIAL/PLATELET
Abs Immature Granulocytes: 0.01 10*3/uL (ref 0.00–0.07)
Basophils Absolute: 0.1 10*3/uL (ref 0.0–0.1)
Basophils Relative: 1 %
Eosinophils Absolute: 0.2 10*3/uL (ref 0.0–0.5)
Eosinophils Relative: 4 %
HCT: 37.2 % (ref 36.0–46.0)
Hemoglobin: 12.4 g/dL (ref 12.0–15.0)
Immature Granulocytes: 0 %
Lymphocytes Relative: 12 %
Lymphs Abs: 0.6 10*3/uL — ABNORMAL LOW (ref 0.7–4.0)
MCH: 31 pg (ref 26.0–34.0)
MCHC: 33.3 g/dL (ref 30.0–36.0)
MCV: 93 fL (ref 80.0–100.0)
Monocytes Absolute: 0.9 10*3/uL (ref 0.1–1.0)
Monocytes Relative: 19 %
Neutro Abs: 3.2 10*3/uL (ref 1.7–7.7)
Neutrophils Relative %: 64 %
Platelets: 152 10*3/uL (ref 150–400)
RBC: 4 MIL/uL (ref 3.87–5.11)
WBC: 5.1 10*3/uL (ref 4.0–10.5)
nRBC: 0 % (ref 0.0–0.2)

## 2020-12-04 LAB — AMMONIA: Ammonia: 60 umol/L — ABNORMAL HIGH (ref 9–35)

## 2020-12-04 LAB — LACTIC ACID, PLASMA: Lactic Acid, Venous: 1.4 mmol/L (ref 0.5–1.9)

## 2020-12-04 LAB — CBG MONITORING, ED: Glucose-Capillary: 76 mg/dL (ref 70–99)

## 2020-12-04 MED ORDER — INSULIN ASPART 100 UNIT/ML ~~LOC~~ SOLN
0.0000 [IU] | Freq: Three times a day (TID) | SUBCUTANEOUS | Status: DC
  Filled 2020-12-04: qty 0.09

## 2020-12-04 MED ORDER — SODIUM CHLORIDE 0.9 % IV SOLN
1.0000 g | INTRAVENOUS | Status: DC
  Administered 2020-12-04: 1 g via INTRAVENOUS
  Filled 2020-12-04: qty 10

## 2020-12-04 MED ORDER — INSULIN ASPART 100 UNIT/ML ~~LOC~~ SOLN
0.0000 [IU] | Freq: Every day | SUBCUTANEOUS | Status: DC
  Filled 2020-12-04: qty 0.05

## 2020-12-04 MED ORDER — LACTULOSE 10 GM/15ML PO SOLN: 30.0000 g | Freq: Two times a day (BID) | ORAL | Status: DC

## 2020-12-04 MED ORDER — METRONIDAZOLE IN NACL 5-0.79 MG/ML-% IV SOLN
500.0000 mg | Freq: Three times a day (TID) | INTRAVENOUS | Status: DC
  Administered 2020-12-04 – 2020-12-09 (×14): 500 mg via INTRAVENOUS
  Filled 2020-12-04 (×14): qty 100

## 2020-12-04 MED ORDER — LACTULOSE 10 GM/15ML PO SOLN
30.0000 g | Freq: Once | ORAL | Status: AC
  Administered 2020-12-04: 30 g via ORAL
  Filled 2020-12-04: qty 45

## 2020-12-04 MED ORDER — LACTULOSE 10 GM/15ML PO SOLN
30.0000 g | Freq: Two times a day (BID) | ORAL | Status: DC
  Administered 2020-12-05: 30 g via ORAL
  Filled 2020-12-04: qty 45

## 2020-12-04 MED ORDER — SODIUM CHLORIDE 0.9 % IV SOLN: INTRAVENOUS | Status: AC

## 2020-12-04 NOTE — ED Provider Notes (Signed)
[facility] DEPT Provider Note   CSN: [id number] Arrival date & time: 12/04/20  1656     History Chief Complaint  Patient presents with  . Altered Mental Status    Sheila [name] is a 75 y.o. female.  Patient is a 75 year old female with a history of diabetes,, hypertension, hyperlipidemia, cirrhosis with variceal disease who presents with confusion.  She reportedly was previously living in a rehab facility and recently was discharged home.  She is living in an independent living facility with her husband who has dementia.  She was found to have some confusion that started last night.  She apparently has been admitted before with similar symptoms due to elevated ammonia levels.  History is limited due to her confusion but she is not currently reporting any fevers or other recent illnesses.        Past Medical History:  Diagnosis Date  . Anemia   . Anxiety   . Cataract   . Depression   . Diabetes mellitus without complication ([place])   . Diverticulitis    Bled  . Esophageal varices ([place])   . Gallstones   . GERD (gastroesophageal reflux disease)   . Heart murmur   . Hx of migraines   . Hyperlipidemia   . Hypertension   . IC (interstitial cystitis)   . Irritable bowel syndrome   . NASH (nonalcoholic steatohepatitis)   . Obesity   . Pneumonia   . PVC (premature ventricular contraction)   . Vulva cancer [facility])     Patient Active Problem List   Diagnosis Date Noted  . Acute metabolic encephalopathy 14/48/1856  . UTI (urinary tract infection) 12/04/2020  . Colitis 12/04/2020  . Hypokalemia 10/23/2020  . COVID-19 virus infection 10/23/2020  . Type 2 diabetes mellitus ([place]) 10/23/2020  . Pressure injury of skin 02/11/2020  . Acute hepatic encephalopathy 02/09/2020  . Esophageal varices determined by endoscopy ([place]) 06/30/2019  . Portal hypertensive gastropathy ([place]) 06/30/2019  . Elevated troponin   . Demand ischemia ([place])   . Essential  hypertension   . NSTEMI (non-ST elevated myocardial infarction) ([place]) 05/23/2019  . Chest pain 05/22/2019  . Hepatic encephalopathy ([place])   . Liver cirrhosis ([place])   . NASH (nonalcoholic steatohepatitis)   . Weakness 05/13/2019  . AKI (acute kidney injury) ([place]) 05/13/2019    Past Surgical History:  Procedure Laterality Date  . ABDOMINAL HYSTERECTOMY     total  . CHOLECYSTECTOMY    . COLONOSCOPY    . FOOT SURGERY Bilateral    Bunion  . HERNIA REPAIR       OB History   No obstetric history on file.     Family History  Adopted: Yes    Social History   Tobacco Use  . Smoking status: Former Smoker    Packs/day: 1.00    Years: 25.00    Pack years: 25.00    Types: Cigarettes    Quit date: 2009    Years since quitting: 13.2  . Smokeless tobacco: Never Used  Vaping Use  . Vaping Use: Never used  Substance Use Topics  . Alcohol use: Never    Comment: rarely  . Drug use: Never    Home Medications Prior to Admission medications   Medication Sig Start Date End Date Taking? Authorizing Provider  ARIPiprazole (ABILIFY) 10 MG tablet Take 10 mg by mouth at bedtime.   Yes [provider]  bethanechol (URECHOLINE) 5 MG tablet Take 5 mg by mouth 3 (three) times  daily. 11/26/20  Yes [provider]  buPROPion (WELLBUTRIN XL) 300 MG 24 hr tablet Take 300 mg by mouth every morning. 04/15/19  Yes [provider]  Calcium-Vitamin D-Vitamin K (VIACTIV PO) Take 1 tablet by mouth daily.   Yes [provider]  clobetasol cream (TEMOVATE) 1.61 % Apply 1 application topically daily as needed for rash. 11/01/16  Yes [provider]  diclofenac sodium (VOLTAREN) 1 % GEL Apply 2 g topically 2 (two) times daily as needed for pain. 09/12/17  Yes [provider]  ezetimibe (ZETIA) 10 MG tablet Take 10 mg by mouth daily. 08/05/18  Yes [provider]  FLUoxetine (PROZAC) 40 MG capsule Take 40 mg by mouth every morning.   Yes [provider]  furosemide (LASIX) 20 MG tablet Take 20 mg by mouth every morning. 10/04/20  Yes [provider]  hydrALAZINE (APRESOLINE) 10 MG tablet Take 10 mg by mouth 4 (four) times daily.   Yes [provider]  lactulose (CHRONULAC) 10 GM/15ML solution Take 45 mLs (30 g total) by mouth 4 (four) times daily. Patient taking differently: Take 30 g by mouth 2 (two) times daily. 11/02/20  Yes Cato Mulligan, MD  omeprazole (PRILOSEC) 40 MG capsule Take 40 mg by mouth every morning. 12/22/14  Yes [provider]  ondansetron (ZOFRAN) 4 MG tablet Take 4 mg by mouth every 8 (eight) hours as needed for nausea or vomiting.   Yes [provider]  Polyethyl Glycol-Propyl Glycol (SYSTANE) 0.4-0.3 % SOLN Place 1 drop into both eyes 2 (two) times daily.   Yes [provider]  spironolactone (ALDACTONE) 100 MG tablet Take 1 tablet (100 mg total) by mouth daily. 11/03/20  Yes Cato Mulligan, MD  lactulose, encephalopathy, (CHRONULAC) 10 GM/15ML SOLN TAKE 45 MLS (30 G TOTAL) BY MOUTH FOUR TIMES DAILY. Patient not taking: No sig reported 11/02/20 11/02/21  Cato Mulligan, MD  spironolactone (ALDACTONE) 100 MG tablet TAKE 1 TABLET (100 MG TOTAL) BY MOUTH DAILY. Patient not taking: No sig reported 11/02/20 11/02/21  Cato Mulligan, MD    Allergies    Acetaminophen, Cefazolin, Ciprofloxacin hcl, Ibuprofen, Sulfa antibiotics, Naproxen sodium, and Amoxicillin  Review of Systems   Review of Systems  Unable to perform ROS: Mental status change    Physical Exam Updated Vital Signs BP (!) 170/74   Pulse 97   Temp 97.8 F (36.6 C) (Oral)   Resp 20   Ht 5' 5"  (1.651 m)   Wt 73.4 kg   SpO2 96%   BMI 26.93 kg/m   Physical Exam Constitutional:      Appearance: She is well-developed.  HENT:     Head: Normocephalic and atraumatic.  Eyes:     Pupils: Pupils are equal, round, and reactive to light.  Cardiovascular:     Rate and Rhythm: Normal rate and regular  rhythm.     Heart sounds: Normal heart sounds.  Pulmonary:     Effort: Pulmonary effort is normal. No respiratory distress.     Breath sounds: Normal breath sounds. No wheezing or rales.  Chest:     Chest wall: No tenderness.  Abdominal:     General: Bowel sounds are normal.     Palpations: Abdomen is soft.     Tenderness: There is abdominal tenderness (Moderate diffuse tenderness). There is no guarding or rebound.  Musculoskeletal:        General: Normal range of motion.     Cervical back: Normal range of motion and  neck supple.  Lymphadenopathy:     Cervical: No cervical adenopathy.  Skin:    General: Skin is warm and dry.     Findings: No rash.  Neurological:     General: No focal deficit present.     Mental Status: She is alert.     Comments: Oriented to person and place only, I do not appreciate any focal deficits although she does have difficulty following commands.     ED Results / Procedures / Treatments   Labs (all labs ordered are listed, but only abnormal results are displayed) Labs Reviewed  COMPREHENSIVE METABOLIC PANEL - Abnormal; Notable for the following components:      Result Value   Sodium 133 (*)    BUN 34 (*)    Creatinine, Ser 1.36 (*)    Calcium 10.4 (*)    Albumin 2.9 (*)    AST 48 (*)    Total Bilirubin 1.4 (*)    GFR, Estimated 40 (*)    All other components within normal limits  CBC WITH DIFFERENTIAL/PLATELET - Abnormal; Notable for the following components:   Lymphs Abs 0.6 (*)    All other components within normal limits  URINALYSIS, ROUTINE W REFLEX MICROSCOPIC - Abnormal; Notable for the following components:   Hgb urine dipstick MODERATE (*)    Nitrite POSITIVE (*)    Leukocytes,Ua LARGE (*)    WBC, UA >50 (*)    Bacteria, UA RARE (*)    All other components within normal limits  AMMONIA - Abnormal; Notable for the following components:   Ammonia 60 (*)    All other components within normal limits  URINE CULTURE  SARS CORONAVIRUS  2 (TAT 6-24 HRS)  CULTURE, BLOOD (ROUTINE X 2)  CULTURE, BLOOD (ROUTINE X 2)  LACTIC ACID, PLASMA  AMMONIA  CALCIUM, IONIZED  CBG MONITORING, ED    EKG None  Radiology CT Abdomen Pelvis Wo Contrast  Result Date: 12/04/2020 CLINICAL DATA:  Acute abdominal pain. Confusion beginning last night. History of liver failure. High ammonia levels. EXAM: CT ABDOMEN AND PELVIS WITHOUT CONTRAST TECHNIQUE: Multidetector CT imaging of the abdomen and pelvis was performed following the standard protocol without IV contrast. COMPARISON:  Ultrasound abdomen 10/06/2020. CT abdomen and pelvis 05/24/2019 FINDINGS: Lower chest: Mild dependent changes in the lung bases. Hepatobiliary: Changes of hepatic cirrhosis with enlarged lateral segment left and caudate lobes and atrophy of the right lobe. Diffuse nodular contour to the liver. No focal lesions demonstrated on noncontrast imaging. Gallbladder is surgically absent. No bile duct dilatation. Pancreas: Unremarkable. No pancreatic ductal dilatation or surrounding inflammatory changes. Spleen: Normal in size without focal abnormality. Adrenals/Urinary Tract: No adrenal gland nodules. No hydronephrosis or hydroureter of the kidneys. Multiple bilateral renal cysts. Hyperdense cyst on the right likely represents a hemorrhagic cyst. Bladder is completely decompressed with a Foley catheter. Stomach/Bowel: Stomach is decompressed. Redundant sigmoid colon. Colon is diffusely stool-filled with fluid in the rectum consistent with diarrhea. Wall thickening in the right colon with pericolonic stranding likely representing portal hypertensive colopathy. Focal colitis would be an alternative possibility. No obstruction. Small bowel are mostly decompressed. Vascular/Lymphatic: Diffuse aortic calcification. No aneurysm. No significant lymphadenopathy in the retroperitoneum. Scattered retroperitoneal, porta hepatis, and mesenteric lymph nodes are probably reactive related to cirrhosis.  Reproductive: Uterus is surgically absent. No abnormal adnexal lesions. Other: No free air or free fluid in the abdomen. Abdominal wall musculature appears intact. Musculoskeletal: Spondylolysis with mild spondylolisthesis at L5-S1. Degenerative changes in the spine. No destructive  bone lesions. IMPRESSION: 1. Changes of hepatic cirrhosis. 2. Wall thickening in the right colon with pericolonic stranding likely representing portal hypertensive colopathy. Focal colitis would be an alternative possibility. 3. Multiple bilateral renal cysts. Hyperdense cyst on the right likely represents a hemorrhagic cyst. 4. Diffuse aortic calcification. Aortic Atherosclerosis (ICD10-I70.0). Electronically Signed   By: Lucienne Capers M.D.   On: 12/04/2020 19:25   CT Head Wo Contrast  Result Date: 12/04/2020 CLINICAL DATA:  Mental status changes EXAM: CT HEAD WITHOUT CONTRAST TECHNIQUE: Contiguous axial images were obtained from the base of the skull through the vertex without intravenous contrast. COMPARISON:  10/22/2020 FINDINGS: Brain: There is atrophy and chronic small vessel disease changes. No acute intracranial abnormality. Specifically, no hemorrhage, hydrocephalus, mass lesion, acute infarction, or significant intracranial injury. Vascular: No hyperdense vessel or unexpected calcification. Skull: No acute calvarial abnormality. Sinuses/Orbits: No acute findings Other: None IMPRESSION: Atrophy, chronic microvascular disease. No acute intracranial abnormality. Electronically Signed   By: Rolm Baptise M.D.   On: 12/04/2020 19:15    Procedures Procedures   Medications Ordered in ED Medications  cefTRIAXone (ROCEPHIN) 1 g in sodium chloride 0.9 % 100 mL IVPB (1 g Intravenous New Bag/Given 12/04/20 2150)  insulin aspart (novoLOG) injection 0-9 Units (has no administration in time range)  insulin aspart (novoLOG) injection 0-5 Units (0 Units Subcutaneous Not Given 12/04/20 2236)  0.9 %  sodium chloride infusion (  Intravenous New Bag/Given 12/04/20 2344)  metroNIDAZOLE (FLAGYL) IVPB 500 mg (500 mg Intravenous New Bag/Given 12/04/20 2236)  lactulose (CHRONULAC) 10 GM/15ML solution 30 g (has no administration in time range)  lactulose (CHRONULAC) 10 GM/15ML solution 30 g (30 g Oral Given 12/04/20 2317)    ED Course  I have reviewed the triage vital signs and the nursing notes.  Pertinent labs & imaging results that were available during my care of the patient were reviewed by me and considered in my medical decision making (see chart for details).    MDM Rules/Calculators/A&P                          Patient is a 75 year old female who presents with confusion.  She does not have any focal deficits on exam.  She had a head CT which shows no acute abnormalities.  Her ammonia level is slightly elevated.  She also has evidence of a UTI.  Both of these may be contributing to her confusion.  She does not have any suggestions of sepsis.  She is allergic to multiple antibiotics.  I spoke with Dr. Marlowe Sax who advised that she will start patient on antibiotic therapy and will admit for further treatment. Final Clinical Impression(s) / ED Diagnoses Final diagnoses:  Altered mental status, unspecified altered mental status type  Hepatic encephalopathy ([place])  Urinary tract infection without hematuria, site unspecified    Rx / DC Orders ED Discharge Orders    None       Malvin Johns, MD 12/05/20 [phone]

## 2020-12-04 NOTE — ED Notes (Signed)
ED TO INPATIENT HANDOFF REPORT  ED Nurse Name and Phone #: Clarise Cruz [phone]  S Name/Age/Gender Sheila [name] 75 y.o. female Room/Bed: WA15/WA15  Code Status   Code Status: Full Code  Home/SNF/Other Home Patient oriented to: self and place Is this baseline? No   Triage Complete: Triage complete  Chief Complaint Acute metabolic encephalopathy [S93.73]  Triage Note Pt came from home via EMS. C/c: confusion that began last night. Pt reports difficulty finishing sentences. No hx of stroke, no new weakness. PMH of liver failure. Issues with high ammonia levels in the past that causes this similar kind of confusion. Pt recently d/c from rehab facility due to insurance issues. Pt lives in independent living with husband that has dementia. ETCO2: 27 CBG: 93 afebrile    Allergies Allergies  Allergen Reactions  . Acetaminophen Other (See Comments)    Patient avoids Tylenol due to fatty liver disease  . Cefazolin Other (See Comments)    Caused C-DIF   . Ciprofloxacin Hcl Nausea Only  . Ibuprofen Other (See Comments)    Diabetic--Concerns about kidneys  . Sulfa Antibiotics Hives and Itching  . Naproxen Sodium Other (See Comments)    Causes pain in veins  . Amoxicillin Diarrhea    Did it involve swelling of the face/tongue/throat, SOB, or low BP? No Did it involve sudden or severe rash/hives, skin peeling, or any reaction on the inside of your mouth or nose? No Did you need to seek medical attention at a hospital or doctor's office? No When did it last happen?several years If all above answers are "NO", may proceed with cephalosporin use.     Level of Care/Admitting Diagnosis ED Disposition    ED Disposition Condition [facility] Area: [place] [[id number]]  Level of Care: Med-Surg [16]  May admit patient to [facility] or [facility] if equivalent level of care is available:: Yes  Covid Evaluation: Asymptomatic Screening Protocol (No  Symptoms)  Diagnosis: Acute metabolic encephalopathy [[id number]]  Admitting Physician: Shela Leff [[id number]]  Attending Physician: Shela Leff [[id number]]  Estimated length of stay: past midnight tomorrow  Certification:: I certify this patient will need inpatient services for at least 2 midnights       B Medical/Surgery History Past Medical History:  Diagnosis Date  . Anemia   . Anxiety   . Cataract   . Depression   . Diabetes mellitus without complication ([place])   . Diverticulitis    Bled  . Esophageal varices ([place])   . Gallstones   . GERD (gastroesophageal reflux disease)   . Heart murmur   . Hx of migraines   . Hyperlipidemia   . Hypertension   . IC (interstitial cystitis)   . Irritable bowel syndrome   . NASH (nonalcoholic steatohepatitis)   . Obesity   . Pneumonia   . PVC (premature ventricular contraction)   . Vulva cancer [facility])    Past Surgical History:  Procedure Laterality Date  . ABDOMINAL HYSTERECTOMY     total  . CHOLECYSTECTOMY    . COLONOSCOPY    . FOOT SURGERY Bilateral    Bunion  . HERNIA REPAIR       A IV Location/Drains/Wounds Patient Lines/Drains/Airways Status    Active Line/Drains/Airways    Name Placement date Placement time Site Days   Peripheral IV 12/04/20 Left;Upper Arm 12/04/20  1844  Arm  less than 1   Urethral Catheter Alphonsus Sias, RN Double-lumen 14 Fr. 10/31/20  6384  Double-lumen  34   Pressure Injury 02/09/20 Mid;Right;Left;Medial Stage 1 -  Intact skin with non-blanchable redness of a localized area usually over a bony prominence. Non blanchable redness on sacral area. 02/09/20  1355  -- 299          Intake/Output Last 24 hours No intake or output data in the 24 hours ending 12/04/20 2221  Labs/Imaging Results for orders placed or performed during the hospital encounter of 12/04/20 (from the past 48 hour(s))  Comprehensive metabolic panel     Status: Abnormal   Collection Time: 12/04/20  6:51 PM  Result  Value Ref Range   Sodium 133 (L) 135 - 145 mmol/L   Potassium 4.1 3.5 - 5.1 mmol/L   Chloride 104 98 - 111 mmol/L   CO2 22 22 - 32 mmol/L   Glucose, Bld 87 70 - 99 mg/dL    Comment: Glucose reference range applies only to samples taken after fasting for at least 8 hours.   BUN 34 (H) 8 - 23 mg/dL   Creatinine, Ser 1.36 (H) 0.44 - 1.00 mg/dL   Calcium 10.4 (H) 8.9 - 10.3 mg/dL   Total Protein 6.7 6.5 - 8.1 g/dL   Albumin 2.9 (L) 3.5 - 5.0 g/dL   AST 48 (H) 15 - 41 U/L   ALT 31 0 - 44 U/L   Alkaline Phosphatase 118 38 - 126 U/L   Total Bilirubin 1.4 (H) 0.3 - 1.2 mg/dL   GFR, Estimated 40 (L) >60 mL/min    Comment: (NOTE) Calculated using the CKD-EPI Creatinine Equation (2021)    Anion gap 7 5 - 15    Comment: Performed at [facility], [place] [address], [place], [place] [address]  CBC with Differential     Status: Abnormal   Collection Time: 12/04/20  6:51 PM  Result Value Ref Range   WBC 5.1 4.0 - 10.5 K/uL   RBC 4.00 3.87 - 5.11 MIL/uL   Hemoglobin 12.4 12.0 - 15.0 g/dL   HCT 37.2 36.0 - 46.0 %   MCV 93.0 80.0 - 100.0 fL   MCH 31.0 26.0 - 34.0 pg   MCHC 33.3 30.0 - 36.0 g/dL   RDW Not Measured 11.5 - 15.5 %   Platelets 152 150 - 400 K/uL   nRBC 0.0 0.0 - 0.2 %   Neutrophils Relative % 64 %   Neutro Abs 3.2 1.7 - 7.7 K/uL   Lymphocytes Relative 12 %   Lymphs Abs 0.6 (L) 0.7 - 4.0 K/uL   Monocytes Relative 19 %   Monocytes Absolute 0.9 0.1 - 1.0 K/uL   Eosinophils Relative 4 %   Eosinophils Absolute 0.2 0.0 - 0.5 K/uL   Basophils Relative 1 %   Basophils Absolute 0.1 0.0 - 0.1 K/uL   Immature Granulocytes 0 %   Abs Immature Granulocytes 0.01 0.00 - 0.07 K/uL   Target Cells PRESENT    Ovalocytes PRESENT     Comment: Performed at [facility], [place] [address] [place], [place] [address]  Urinalysis, Routine w reflex microscopic     Status: Abnormal   Collection Time: 12/04/20  6:51 PM  Result Value Ref Range   Color, Urine YELLOW  YELLOW   APPearance CLEAR CLEAR   Specific Gravity, Urine 1.011 1.005 - 1.030   pH 5.0 5.0 - 8.0   Glucose, UA NEGATIVE NEGATIVE mg/dL   Hgb urine dipstick MODERATE (A) NEGATIVE   Bilirubin Urine NEGATIVE NEGATIVE   Ketones, ur NEGATIVE NEGATIVE mg/dL  Protein, ur NEGATIVE NEGATIVE mg/dL   Nitrite POSITIVE (A) NEGATIVE   Leukocytes,Ua LARGE (A) NEGATIVE   RBC / HPF 21-50 0 - 5 RBC/hpf   WBC, UA >50 (H) 0 - 5 WBC/hpf   Bacteria, UA RARE (A) NONE SEEN   Squamous Epithelial / LPF 0-5 0 - 5   Mucus PRESENT    Hyaline Casts, UA PRESENT     Comment: Performed at [facility], [place] [address] [place], [place] [address]  Lactic acid, plasma     Status: None   Collection Time: 12/04/20  6:51 PM  Result Value Ref Range   Lactic Acid, Venous 1.4 0.5 - 1.9 mmol/L    Comment: Performed at [facility], [place] [address], [place], [place] [address]  Ammonia     Status: Abnormal   Collection Time: 12/04/20  6:51 PM  Result Value Ref Range   Ammonia 60 (H) 9 - 35 umol/L    Comment: Performed at [facility], [place] [address], [place], [place] [address]   CT Abdomen Pelvis Wo Contrast  Result Date: 12/04/2020 CLINICAL DATA:  Acute abdominal pain. Confusion beginning last night. History of liver failure. High ammonia levels. EXAM: CT ABDOMEN AND PELVIS WITHOUT CONTRAST TECHNIQUE: Multidetector CT imaging of the abdomen and pelvis was performed following the standard protocol without IV contrast. COMPARISON:  Ultrasound abdomen 10/06/2020. CT abdomen and pelvis 05/24/2019 FINDINGS: Lower chest: Mild dependent changes in the lung bases. Hepatobiliary: Changes of hepatic cirrhosis with enlarged lateral segment left and caudate lobes and atrophy of the right lobe. Diffuse nodular contour to the liver. No focal lesions demonstrated on noncontrast imaging. Gallbladder is surgically absent. No bile duct dilatation. Pancreas: Unremarkable. No pancreatic ductal  dilatation or surrounding inflammatory changes. Spleen: Normal in size without focal abnormality. Adrenals/Urinary Tract: No adrenal gland nodules. No hydronephrosis or hydroureter of the kidneys. Multiple bilateral renal cysts. Hyperdense cyst on the right likely represents a hemorrhagic cyst. Bladder is completely decompressed with a Foley catheter. Stomach/Bowel: Stomach is decompressed. Redundant sigmoid colon. Colon is diffusely stool-filled with fluid in the rectum consistent with diarrhea. Wall thickening in the right colon with pericolonic stranding likely representing portal hypertensive colopathy. Focal colitis would be an alternative possibility. No obstruction. Small bowel are mostly decompressed. Vascular/Lymphatic: Diffuse aortic calcification. No aneurysm. No significant lymphadenopathy in the retroperitoneum. Scattered retroperitoneal, porta hepatis, and mesenteric lymph nodes are probably reactive related to cirrhosis. Reproductive: Uterus is surgically absent. No abnormal adnexal lesions. Other: No free air or free fluid in the abdomen. Abdominal wall musculature appears intact. Musculoskeletal: Spondylolysis with mild spondylolisthesis at L5-S1. Degenerative changes in the spine. No destructive bone lesions. IMPRESSION: 1. Changes of hepatic cirrhosis. 2. Wall thickening in the right colon with pericolonic stranding likely representing portal hypertensive colopathy. Focal colitis would be an alternative possibility. 3. Multiple bilateral renal cysts. Hyperdense cyst on the right likely represents a hemorrhagic cyst. 4. Diffuse aortic calcification. Aortic Atherosclerosis (ICD10-I70.0). Electronically Signed   By: Lucienne Capers M.D.   On: 12/04/2020 19:25   CT Head Wo Contrast  Result Date: 12/04/2020 CLINICAL DATA:  Mental status changes EXAM: CT HEAD WITHOUT CONTRAST TECHNIQUE: Contiguous axial images were obtained from the base of the skull through the vertex without intravenous contrast.  COMPARISON:  10/22/2020 FINDINGS: Brain: There is atrophy and chronic small vessel disease changes. No acute intracranial abnormality. Specifically, no hemorrhage, hydrocephalus, mass lesion, acute infarction, or significant intracranial injury. Vascular: No hyperdense vessel or unexpected  calcification. Skull: No acute calvarial abnormality. Sinuses/Orbits: No acute findings Other: None IMPRESSION: Atrophy, chronic microvascular disease. No acute intracranial abnormality. Electronically Signed   By: Rolm Baptise M.D.   On: 12/04/2020 19:15    Pending Labs Unresulted Labs (From admission, onward)          Start     Ordered   12/05/20 0500  Ammonia  Tomorrow morning,   R        12/04/20 2115   12/05/20 0500  Calcium, ionized  Tomorrow morning,   R        12/04/20 2151   12/04/20 2154  Culture, blood (routine x 2)  BLOOD CULTURE X 2,   R (with STAT occurrences)      12/04/20 2153   12/04/20 2120  SARS CORONAVIRUS 2 (TAT 6-24 HRS) Nasopharyngeal Nasopharyngeal Swab  (Tier 3 - Symptomatic/asymptomatic with Precautions)  Once,   STAT       Question Answer Comment  Is this test for diagnosis or screening Screening   Symptomatic for COVID-19 as defined by CDC No   Hospitalized for COVID-19 No   Admitted to ICU for COVID-19 No   Previously tested for COVID-19 Yes   Resident in a congregate (group) care setting No   Employed in healthcare setting Unknown   Pregnant No   Has patient completed COVID vaccination(s) (2 doses of Pfizer/Moderna 1 dose of [employer]) Unknown      12/04/20 2119   12/04/20 2029  Urine culture  ONCE - STAT,   STAT        12/04/20 2028          Vitals/Pain Today's Vitals   12/04/20 1930 12/04/20 1945 12/04/20 2030 12/04/20 2100  BP: (!) 154/69 (!) 156/67 (!) 158/72 (!) 161/67  Pulse: 100 98 98 98  Resp: 20 16 14 12   Temp:      TempSrc:      SpO2: 94% 91% 92% 92%  Weight:      Height:      PainSc:        Isolation Precautions Airborne and Contact  precautions  Medications Medications  cefTRIAXone (ROCEPHIN) 1 g in sodium chloride 0.9 % 100 mL IVPB (1 g Intravenous New Bag/Given 12/04/20 2150)  insulin aspart (novoLOG) injection 0-9 Units (has no administration in time range)  insulin aspart (novoLOG) injection 0-5 Units (has no administration in time range)  0.9 %  sodium chloride infusion (has no administration in time range)  metroNIDAZOLE (FLAGYL) IVPB 500 mg (has no administration in time range)  lactulose (CHRONULAC) 10 GM/15ML solution 30 g (has no administration in time range)  lactulose (CHRONULAC) 10 GM/15ML solution 30 g (has no administration in time range)    Mobility walks with person assist High fall risk   Focused Assessments    R Recommendations: See Admitting Provider Note  Report given to:   Additional Notes:

## 2020-12-04 NOTE — ED Triage Notes (Signed)
Pt came from home via EMS. C/c: confusion that began last night. Pt reports difficulty finishing sentences. No hx of stroke, no new weakness. PMH of liver failure. Issues with high ammonia levels in the past that causes this similar kind of confusion. Pt recently d/c from rehab facility due to insurance issues. Pt lives in independent living with husband that has dementia. ETCO2: 27 CBG: 93 afebrile

## 2020-12-04 NOTE — Progress Notes (Signed)
Received patient from ED at 2244. Patient is Alert and oriented to person, place and time but disoriented to situation. On RA, breathing is even and unlabored. Indwelling catheter in place, draining freely yellow colored urine. Denies pain or discomfort at this time. Oriented to room, call bell placed within reach.

## 2020-12-04 NOTE — H&P (Signed)
History and Physical    Sheila [name] TTS:[id number] DOB: Jun 15, 1946 DOA: 12/04/2020  PCP: Sheila Noon, MD Patient coming from: Home  Chief Complaint: Confusion  HPI: Sheila [name] is a 75 y.o. female with medical history significant of Sheila [name] cirrhosis and hepatic encephalopathy, CAD, chronic diastolic CHF, hypertension, hyperlipidemia, non-insulin-dependent type 2 diabetes, CKD stage IIIa, anemia, bipolar disorder, anxiety, depression, GERD presenting to the ED via EMS for evaluation of confusion.  She was admitted a month ago for hepatic encephalopathy.  History limited as patient is confused and disoriented.  She does not know why she is here.  Reports 1 episode of vomiting yesterday.  Endorsing abdominal pain but is not able to localize the site of the pain.  Endorsing poor oral intake.  Also complaining of swelling in her feet.  Denies fevers, cough, shortness of breath, or chest pain.  No additional history could be obtained from her.  ED Course: Afebrile.  Slightly tachycardic but not hypotensive.  Labs showing WBC 5.1, hemoglobin 12.4, platelet count 152K.  Sodium 133, potassium 4.1, chloride 104, bicarb 22, BUN 34, creatinine 1.3 (baseline 0.8), glucose 87.  Corrected calcium 11.3.  No significant elevation of LFTs.  UA with signs of infection (positive nitrite, large amount of leukocytes, and greater than 50 WBCs).  Urine culture pending.  Lactic acid within normal range.  Ammonia level 60.  Head CT negative for acute intracranial abnormality.  CT abdomen pelvis showing wall thickening in the right colon with pericolonic stranding, findings suspicious for portal hypertensive colopathy versus possible focal colitis.  Also showing multiple bilateral renal cysts with a hemorrhagic cyst on the right.  No medications administered in the ED.  Review of Systems:  All systems reviewed and apart from history of presenting illness, are negative.  Past Medical History:  Diagnosis Date  . Anemia    . Anxiety   . Cataract   . Depression   . Diabetes mellitus without complication ([place])   . Diverticulitis    Bled  . Esophageal varices ([place])   . Gallstones   . GERD (gastroesophageal reflux disease)   . Heart murmur   . Hx of migraines   . Hyperlipidemia   . Hypertension   . IC (interstitial cystitis)   . Irritable bowel syndrome   . NASH (nonalcoholic steatohepatitis)   . Obesity   . Pneumonia   . PVC (premature ventricular contraction)   . Vulva cancer [facility])     Past Surgical History:  Procedure Laterality Date  . ABDOMINAL HYSTERECTOMY     total  . CHOLECYSTECTOMY    . COLONOSCOPY    . FOOT SURGERY Bilateral    Bunion  . HERNIA REPAIR       reports that she quit smoking about 13 years ago. Her smoking use included cigarettes. She has a 25.00 pack-year smoking history. She has never used smokeless tobacco. She reports that she does not drink alcohol and does not use drugs.  Allergies  Allergen Reactions  . Acetaminophen Other (See Comments)    Patient avoids Tylenol due to fatty liver disease  . Cefazolin Other (See Comments)    Caused C-DIF   . Ciprofloxacin Hcl Nausea Only  . Ibuprofen Other (See Comments)    Diabetic--Concerns about kidneys  . Sulfa Antibiotics Hives and Itching  . Naproxen Sodium Other (See Comments)    Causes pain in veins  . Amoxicillin Diarrhea    Did it involve swelling of the face/tongue/throat, SOB, or low BP? No Did  it involve sudden or severe rash/hives, skin peeling, or any reaction on the inside of your mouth or nose? No Did you need to seek medical attention at a hospital or doctor's office? No When did it last happen?several years If all above answers are "NO", may proceed with cephalosporin use.     Family History  Adopted: Yes    Prior to Admission medications   Medication Sig Start Date End Date Taking? Authorizing Provider  ARIPiprazole (ABILIFY) 10 MG tablet Take 10 mg by mouth at bedtime.   Yes [provider]  buPROPion (WELLBUTRIN XL) 300 MG 24 hr tablet Take 300 mg by mouth every morning. 04/15/19  Yes [provider]  Calcium 500-125 MG-UNIT TABS Take 2 tablets by mouth daily.   Yes [provider]  clobetasol cream (TEMOVATE) 7.10 % Apply 1 application topically daily as needed for rash. 11/01/16  Yes [provider]  diclofenac sodium (VOLTAREN) 1 % GEL Apply 2 g topically 2 (two) times daily as needed for pain. 09/12/17  Yes [provider]  ezetimibe (ZETIA) 10 MG tablet Take 10 mg by mouth every morning. 08/05/18  Yes [provider]  FLUoxetine (PROZAC) 40 MG capsule Take 40 mg by mouth daily.    [provider]  furosemide (LASIX) 20 MG tablet Take 20 mg by mouth daily. 10/04/20   [provider]  hydrALAZINE (APRESOLINE) 10 MG tablet Take 10 mg by mouth 4 (four) times daily.    [provider]  lactulose (CHRONULAC) 10 GM/15ML solution Take 45 mLs (30 g total) by mouth 4 (four) times daily. 11/02/20   Cato Mulligan, MD  lactulose, encephalopathy, (CHRONULAC) 10 GM/15ML SOLN TAKE 45 MLS (30 G TOTAL) BY MOUTH FOUR TIMES DAILY. 11/02/20 11/02/21  Cato Mulligan, MD  omeprazole (PRILOSEC) 40 MG capsule Take 40 mg by mouth daily. 12/22/14   [provider]  Polyethyl Glycol-Propyl Glycol (SYSTANE) 0.4-0.3 % SOLN Place 1 drop into both eyes 2 (two) times daily.    [provider]  spironolactone (ALDACTONE) 100 MG tablet Take 1 tablet (100 mg total) by mouth daily. 11/03/20   Cato Mulligan, MD  spironolactone (ALDACTONE) 100 MG tablet TAKE 1 TABLET (100 MG TOTAL) BY MOUTH DAILY. 11/02/20 11/02/21  Cato Mulligan, MD    Physical Exam: Vitals:   12/04/20 1930 12/04/20 1945 12/04/20 2030 12/04/20 2100  BP: (!) 154/69 (!) 156/67 (!) 158/72 (!) 161/67  Pulse: 100 98 98 98  Resp: 20 16 14 12   Temp:      TempSrc:      SpO2: 94% 91% 92% 92%  Weight:      Height:        Physical Exam Constitutional:       Appearance: She is not diaphoretic.  HENT:     Head: Normocephalic and atraumatic.  Eyes:     Extraocular Movements: Extraocular movements intact.  Cardiovascular:     Rate and Rhythm: Normal rate and regular rhythm.     Pulses: Normal pulses.  Pulmonary:     Effort: Pulmonary effort is normal. No respiratory distress.     Breath sounds: Normal breath sounds. No wheezing or rales.  Abdominal:     General: Bowel sounds are normal. There is no distension.     Palpations: Abdomen is soft.     Tenderness: There is abdominal tenderness. There is guarding. There is no rebound.     Comments: Generalized tenderness to palpation  Musculoskeletal:  General: No tenderness.     Cervical back: Normal range of motion and neck supple.     Right lower leg: Edema present.     Left lower leg: Edema present.     Comments: +1 pedal edema  Skin:    General: Skin is warm and dry.  Neurological:     Mental Status: She is alert.     Comments: Confused Oriented to person and place only Moving all extremities on command, no focal weakness.     Labs on Admission: I have personally reviewed following labs and imaging studies  CBC: Recent Labs  Lab 12/04/20 1851  WBC 5.1  NEUTROABS 3.2  HGB 12.4  HCT 37.2  MCV 93.0  PLT 638   Basic Metabolic Panel: Recent Labs  Lab 12/04/20 1851  NA 133*  K 4.1  CL 104  CO2 22  GLUCOSE 87  BUN 34*  CREATININE 1.36*  CALCIUM 10.4*   GFR: Estimated Creatinine Clearance: 35.9 mL/min (A) (by C-G formula based on SCr of 1.36 mg/dL (H)). Liver Function Tests: Recent Labs  Lab 12/04/20 1851  AST 48*  ALT 31  ALKPHOS 118  BILITOT 1.4*  PROT 6.7  ALBUMIN 2.9*   No results for input(s): LIPASE, AMYLASE in the last 168 hours. Recent Labs  Lab 12/04/20 1851  AMMONIA 60*   Coagulation Profile: No results for input(s): INR, PROTIME in the last 168 hours. Cardiac Enzymes: No results for input(s): CKTOTAL, CKMB, CKMBINDEX, TROPONINI in  the last 168 hours. BNP (last 3 results) No results for input(s): PROBNP in the last 8760 hours. HbA1C: No results for input(s): HGBA1C in the last 72 hours. CBG: No results for input(s): GLUCAP in the last 168 hours. Lipid Profile: No results for input(s): CHOL, HDL, LDLCALC, TRIG, CHOLHDL, LDLDIRECT in the last 72 hours. Thyroid Function Tests: No results for input(s): TSH, T4TOTAL, FREET4, T3FREE, THYROIDAB in the last 72 hours. Anemia Panel: No results for input(s): VITAMINB12, FOLATE, FERRITIN, TIBC, IRON, RETICCTPCT in the last 72 hours. Urine analysis:    Component Value Date/Time   COLORURINE YELLOW 12/04/2020 [place] 12/04/2020 1851   LABSPEC 1.011 12/04/2020 1851   PHURINE 5.0 12/04/2020 1851   GLUCOSEU NEGATIVE 12/04/2020 1851   HGBUR MODERATE (A) 12/04/2020 1851   BILIRUBINUR NEGATIVE 12/04/2020 1851   KETONESUR NEGATIVE 12/04/2020 1851   PROTEINUR NEGATIVE 12/04/2020 1851   NITRITE POSITIVE (A) 12/04/2020 1851   LEUKOCYTESUR LARGE (A) 12/04/2020 1851    Radiological Exams on Admission: CT Abdomen Pelvis Wo Contrast  Result Date: 12/04/2020 CLINICAL DATA:  Acute abdominal pain. Confusion beginning last night. History of liver failure. High ammonia levels. EXAM: CT ABDOMEN AND PELVIS WITHOUT CONTRAST TECHNIQUE: Multidetector CT imaging of the abdomen and pelvis was performed following the standard protocol without IV contrast. COMPARISON:  Ultrasound abdomen 10/06/2020. CT abdomen and pelvis 05/24/2019 FINDINGS: Lower chest: Mild dependent changes in the lung bases. Hepatobiliary: Changes of hepatic cirrhosis with enlarged lateral segment left and caudate lobes and atrophy of the right lobe. Diffuse nodular contour to the liver. No focal lesions demonstrated on noncontrast imaging. Gallbladder is surgically absent. No bile duct dilatation. Pancreas: Unremarkable. No pancreatic ductal dilatation or surrounding inflammatory changes. Spleen: Normal in size  without focal abnormality. Adrenals/Urinary Tract: No adrenal gland nodules. No hydronephrosis or hydroureter of the kidneys. Multiple bilateral renal cysts. Hyperdense cyst on the right likely represents a hemorrhagic cyst. Bladder is completely decompressed with a Foley catheter. Stomach/Bowel: Stomach is decompressed. Redundant  sigmoid colon. Colon is diffusely stool-filled with fluid in the rectum consistent with diarrhea. Wall thickening in the right colon with pericolonic stranding likely representing portal hypertensive colopathy. Focal colitis would be an alternative possibility. No obstruction. Small bowel are mostly decompressed. Vascular/Lymphatic: Diffuse aortic calcification. No aneurysm. No significant lymphadenopathy in the retroperitoneum. Scattered retroperitoneal, porta hepatis, and mesenteric lymph nodes are probably reactive related to cirrhosis. Reproductive: Uterus is surgically absent. No abnormal adnexal lesions. Other: No free air or free fluid in the abdomen. Abdominal wall musculature appears intact. Musculoskeletal: Spondylolysis with mild spondylolisthesis at L5-S1. Degenerative changes in the spine. No destructive bone lesions. IMPRESSION: 1. Changes of hepatic cirrhosis. 2. Wall thickening in the right colon with pericolonic stranding likely representing portal hypertensive colopathy. Focal colitis would be an alternative possibility. 3. Multiple bilateral renal cysts. Hyperdense cyst on the right likely represents a hemorrhagic cyst. 4. Diffuse aortic calcification. Aortic Atherosclerosis (ICD10-I70.0). Electronically Signed   By: Lucienne Capers M.D.   On: 12/04/2020 19:25   CT Head Wo Contrast  Result Date: 12/04/2020 CLINICAL DATA:  Mental status changes EXAM: CT HEAD WITHOUT CONTRAST TECHNIQUE: Contiguous axial images were obtained from the base of the skull through the vertex without intravenous contrast. COMPARISON:  10/22/2020 FINDINGS: Brain: There is atrophy and chronic  small vessel disease changes. No acute intracranial abnormality. Specifically, no hemorrhage, hydrocephalus, mass lesion, acute infarction, or significant intracranial injury. Vascular: No hyperdense vessel or unexpected calcification. Skull: No acute calvarial abnormality. Sinuses/Orbits: No acute findings Other: None IMPRESSION: Atrophy, chronic microvascular disease. No acute intracranial abnormality. Electronically Signed   By: Rolm Baptise M.D.   On: 12/04/2020 19:15    Assessment/Plan Principal Problem:   Acute metabolic encephalopathy Active Problems:   AKI (acute kidney injury) ([place])   Acute hepatic encephalopathy   UTI (urinary tract infection)   Colitis   Acute hepatic and metabolic encephalopathy: Ammonia level elevated at 60 and also has a UTI.  Currently confused and disoriented but no focal weakness.  Head CT negative for acute finding. -Lactulose for hepatic encephalopathy.  Ceftriaxone for UTI.  Continue to monitor mental status closely.  Decompensated [place] cirrhosis with hepatic encephalopathy: Ammonia level elevated, unclear whether patient is taking lactulose at home.  Has generalized abdominal tenderness but no significant abdominal distention/ascites appreciated on exam. -Start lactulose and monitor ammonia level.  Hold Lasix and spironolactone given AKI.  UTI: UA with signs of infection (positive nitrite, large amount of leukocytes, and greater than 50 WBCs).  Slightly tachycardic but no fever, leukocytosis, or lactic acidosis to suggest sepsis. -Ceftriaxone.  Urine culture pending.  Possible colitis: Slightly tachycardic but no fever, leukocytosis, or lactic acidosis to suggest sepsis.  Abdomen tender to palpation. CT abdomen pelvis showing wall thickening in the right colon with pericolonic stranding, findings suspicious for portal hypertensive colopathy versus possible focal colitis.  -Ceftriaxone and Flagyl.  Order blood cultures.  AKI on CKD stage IIIa: BUN 34,  creatinine 1.3 (baseline 0.8).  Possibly prerenal from dehydration as patient is endorsing poor oral intake and an episode of vomiting yesterday. -Hold Lasix and spironolactone.  Give gentle IV fluid hydration and continue to monitor renal function closely.  Hemorrhagic renal cyst/microscopic hematuria: CT showing multiple bilateral renal cysts with a hemorrhagic cyst on the right.  UA with evidence of microscopic hematuria (21-50 RBCs per high-power field). -Avoid chemical DVT prophylaxis.  Urology follow-up  Mild hypercalcemia: Corrected calcium 11.3.  Calcium supplement listed in home medications. -Hold calcium supplement.  Gentle IV  fluid hydration.  Check ionized calcium level.  Chronic diastolic CHF: Has mild pedal edema but lungs clear on exam. -Hold Lasix and spironolactone given AKI.  Giving gentle IV fluid hydration for AKI suspected to be from dehydration.  Continue to monitor volume status closely.  Well-controlled non-insulin-dependent type 2 diabetes: A1c 5.4 on 10/23/2020.  Janumet was discontinued during hospitalization a month ago. -Sliding scale insulin sensitive ACHS.  Anxiety, depression, bipolar disorder Hypertension Hyperlipidemia GERD -Pharmacy med rec pending.  DVT prophylaxis: SCDs Code Status: Full code Family Communication: No family available at this time. Disposition Plan: Status is: Inpatient  Remains inpatient appropriate because:Altered mental status and Inpatient level of care appropriate due to severity of illness   Dispo: The patient is from: Home              Anticipated d/c is to: SNF              Patient currently is not medically stable to d/c.   Difficult to place patient No  Level of care: Level of care: Med-Surg   The medical decision making on this patient was of high complexity and the patient is at high risk for clinical deterioration, therefore this is a level 3 visit.  Shela Leff MD [facility]  If 7PM-7AM, please  contact night-coverage [email or web address]  12/04/2020, 9:53 PM

## 2020-12-05 ENCOUNTER — Encounter (HOSPITAL_COMMUNITY): Payer: Self-pay | Admitting: Internal Medicine

## 2020-12-05 ENCOUNTER — Other Ambulatory Visit: Payer: Self-pay

## 2020-12-05 DIAGNOSIS — E871 Hypo-osmolality and hyponatremia: Secondary | ICD-10-CM

## 2020-12-05 DIAGNOSIS — R5381 Other malaise: Secondary | ICD-10-CM

## 2020-12-05 DIAGNOSIS — T83511A Infection and inflammatory reaction due to indwelling urethral catheter, initial encounter: Secondary | ICD-10-CM

## 2020-12-05 DIAGNOSIS — R7989 Other specified abnormal findings of blood chemistry: Secondary | ICD-10-CM

## 2020-12-05 LAB — CBC
HCT: 38.5 % (ref 36.0–46.0)
Hemoglobin: 13 g/dL (ref 12.0–15.0)
MCH: 31.3 pg (ref 26.0–34.0)
MCHC: 33.8 g/dL (ref 30.0–36.0)
MCV: 92.5 fL (ref 80.0–100.0)
Platelets: 131 10*3/uL — ABNORMAL LOW (ref 150–400)
RBC: 4.16 MIL/uL (ref 3.87–5.11)
WBC: 4.4 10*3/uL (ref 4.0–10.5)
nRBC: 0 % (ref 0.0–0.2)

## 2020-12-05 LAB — RENAL FUNCTION PANEL
Albumin: 2.7 g/dL — ABNORMAL LOW (ref 3.5–5.0)
Anion gap: 7 (ref 5–15)
BUN: 32 mg/dL — ABNORMAL HIGH (ref 8–23)
CO2: 20 mmol/L — ABNORMAL LOW (ref 22–32)
Calcium: 10.2 mg/dL (ref 8.9–10.3)
Chloride: 108 mmol/L (ref 98–111)
Creatinine, Ser: 1.18 mg/dL — ABNORMAL HIGH (ref 0.44–1.00)
GFR, Estimated: 48 mL/min — ABNORMAL LOW (ref >60)
Glucose, Bld: 87 mg/dL (ref 70–99)
Phosphorus: 2.9 mg/dL (ref 2.5–4.6)
Potassium: 4.4 mmol/L (ref 3.5–5.1)
Sodium: 135 mmol/L (ref 135–145)

## 2020-12-05 LAB — MAGNESIUM: Magnesium: 2.1 mg/dL (ref 1.7–2.4)

## 2020-12-05 LAB — MRSA PCR SCREENING: MRSA by PCR: NEGATIVE

## 2020-12-05 LAB — AMMONIA
Ammonia: 86 umol/L — ABNORMAL HIGH (ref 9–35)
Ammonia: 93 umol/L — ABNORMAL HIGH (ref 9–35)

## 2020-12-05 LAB — GLUCOSE, CAPILLARY
Glucose-Capillary: 89 mg/dL (ref 70–99)
Glucose-Capillary: 101 mg/dL — ABNORMAL HIGH (ref 70–99)
Glucose-Capillary: 116 mg/dL — ABNORMAL HIGH (ref 70–99)

## 2020-12-05 LAB — SARS CORONAVIRUS 2 (TAT 6-24 HRS): SARS Coronavirus 2: NEGATIVE

## 2020-12-05 MED ORDER — FLUOXETINE HCL 20 MG PO CAPS
40.0000 mg | ORAL_CAPSULE | Freq: Every morning | ORAL | Status: DC
  Administered 2020-12-05 – 2020-12-09 (×5): 40 mg via ORAL
  Filled 2020-12-05 (×5): qty 2

## 2020-12-05 MED ORDER — CHLORHEXIDINE GLUCONATE CLOTH 2 % EX PADS
6.0000 | MEDICATED_PAD | Freq: Every day | CUTANEOUS | Status: DC
  Administered 2020-12-05 – 2020-12-09 (×5): 6 via TOPICAL

## 2020-12-05 MED ORDER — ARIPIPRAZOLE 10 MG PO TABS
10.0000 mg | ORAL_TABLET | Freq: Every day | ORAL | Status: DC
  Administered 2020-12-05 – 2020-12-08 (×4): 10 mg via ORAL
  Filled 2020-12-05 (×4): qty 1

## 2020-12-05 MED ORDER — EZETIMIBE 10 MG PO TABS
10.0000 mg | ORAL_TABLET | Freq: Every day | ORAL | Status: DC
  Administered 2020-12-05 – 2020-12-09 (×5): 10 mg via ORAL
  Filled 2020-12-05 (×5): qty 1

## 2020-12-05 MED ORDER — LACTULOSE 10 GM/15ML PO SOLN
30.0000 g | Freq: Four times a day (QID) | ORAL | Status: DC
  Administered 2020-12-05 – 2020-12-07 (×9): 30 g via ORAL
  Filled 2020-12-05 (×9): qty 45

## 2020-12-05 MED ORDER — HYDRALAZINE HCL 10 MG PO TABS
10.0000 mg | ORAL_TABLET | Freq: Three times a day (TID) | ORAL | Status: DC
  Administered 2020-12-05: 10 mg via ORAL
  Filled 2020-12-05: qty 1

## 2020-12-05 MED ORDER — LACTULOSE 10 GM/15ML PO SOLN: 30.0000 g | Freq: Three times a day (TID) | ORAL | Status: DC

## 2020-12-05 MED ORDER — SODIUM CHLORIDE 0.9 % IV SOLN: INTRAVENOUS | Status: DC

## 2020-12-05 MED ORDER — CARVEDILOL 6.25 MG PO TABS
6.2500 mg | ORAL_TABLET | Freq: Two times a day (BID) | ORAL | Status: DC
  Administered 2020-12-06 – 2020-12-09 (×7): 6.25 mg via ORAL
  Filled 2020-12-05 (×7): qty 1

## 2020-12-05 MED ORDER — SODIUM CHLORIDE 0.9 % IV SOLN
2.0000 g | INTRAVENOUS | Status: DC
  Administered 2020-12-05 – 2020-12-07 (×3): 2 g via INTRAVENOUS
  Filled 2020-12-05: qty 2
  Filled 2020-12-05: qty 20
  Filled 2020-12-05: qty 2

## 2020-12-05 MED ORDER — BUPROPION HCL ER (XL) 300 MG PO TB24
300.0000 mg | ORAL_TABLET | Freq: Every morning | ORAL | Status: DC
  Administered 2020-12-05 – 2020-12-09 (×5): 300 mg via ORAL
  Filled 2020-12-05 (×6): qty 1

## 2020-12-05 NOTE — Progress Notes (Addendum)
PROGRESS NOTE  Sheila [name] KAJ:[id number] DOB: [birth year]   PCP: Chesley Noon, MD  Patient is from:[facility].  Bedbound except for transfer to wheelchair using lift at baseline.  DOA: 12/04/2020 LOS: 1  Chief complaints: Confusion  Brief Narrative / Interim history: 75 year old F with PMH of NASH cirrhosis, hepatic encephalopathy, diastolic CHF, and IDDM-2, CKD-3A, HTN, HLD, anemia, GERD, anxiety, depression, bipolar disorder, debility, chronic indwelling Foley and sacral decubitus brought to ED with confusion, poor p.o. intake and vomiting, and admitted for acute hepatic encephalopathy, AKI, dehydration, possible portal hypertensive colopathy versus colitis and possible CAUTI.  Started on IV fluid, lactulose, ceftriaxone and Flagyl.  Subjective: Seen and examined earlier this morning.  No major events overnight of this morning.  She says she does not feel well.  She could not elaborate other than saying she cannot walk.  She denies pain. She denies chest pain or dyspnea.  She wants to get well.  She is oriented x4.  Last emesis yesterday.  It was nonbloody.  She denies abdominal pain or diarrhea.  She has chronic indwelling Foley due to immobility and pressure skin injury.  Objective: Vitals:   12/05/20 0304 12/05/20 0500 12/05/20 0708 12/05/20 1056  BP: (!) 143/72  (!) 149/75 138/71  Pulse: 85  93 92  Resp: 16  16 15   Temp: 97.9 F (36.6 C)  97.9 F (36.6 C) 98.3 F (36.8 C)  TempSrc: Oral  Oral Oral  SpO2: 96%  96% 99%  Weight:  73.4 kg    Height:        Intake/Output Summary (Last 24 hours) at 12/05/2020 1222 Last data filed at 12/05/2020 1200 Gross per 24 hour  Intake 899.39 ml  Output 800 ml  Net 99.39 ml   Filed Weights   12/04/20 1716 12/04/20 2255 12/05/20 0500  Weight: 76.2 kg 73.4 kg 73.4 kg    Examination:  GENERAL: No apparent distress.  Nontoxic. HEENT: MMM.  Vision and hearing grossly intact.  NECK: Supple.  No apparent JVD.  RESP: On RA.  No  IWOB.  Fair aeration bilaterally. CVS:  RRR. Heart sounds normal.  ABD/GI/GU: BS+. Abd soft.  Mild LLQ tenderness.  Indwelling Foley in place. MSK/EXT:  Moves extremities. No apparent deformity. No edema.  SKIN: no apparent skin lesion or wound NEURO: Awake, alert and oriented appropriately.  No apparent focal neuro deficit other than BLE weakness. PSYCH: Calm. Normal affect.   Procedures:  4/3-indwelling Foley catheter change  Microbiology summarized: [id number]-74 and influenza PCR nonreactive. Urine culture pending. Blood culture pending.  Assessment & Plan: Acute hepatic and metabolic encephalopathy: Mental status improved but ammonia trended up from 60-93.  There is also some element of dehydration and possible CAUTI.  No focal neuro deficit other than generalized weakness.  CT head without acute finding. -Increase lactulose to 4 times daily -Continue IV fluid for hydration -Continue treatment for UTI and possible colitis -Follow urine and blood cultures. -Continue trending ammonia  Decompensated Nash cirrhosis with hepatic encephalopathy:  -Lactulose as above -Continue holding Lasix and Aldactone in the setting of AKI and dehydration  CAUTI: this is difficult diagnosis and have to go by urinalysis and urine culture.  She has LLQ tenderness as well.  -Continue ceftriaxone pending urine culture -Change indwelling Foley catheter  Possible colitis: CT findings suspicious for portal hypertensive colopathy versus focal right colitis but her tenderness is over LLQ.  Patient has no diarrhea.  Already on ceftriaxone for possible UTI. -Continue Flagyl for now  AKI on CKD-3A/azotemia: Likely due to dehydration and ongoing use of diuretics.  Improving. Recent Labs    10/25/20 0118 10/26/20 0118 10/27/20 0217 10/28/20 0358 10/29/20 0310 10/30/20 0132 10/31/20 0352 11/02/20 0127 12/04/20 1851 12/05/20 0816  BUN 10 12 13 15 12 13 13 16  34* 32*  CREATININE 0.96 0.90 0.83 0.93  0.94 0.93 0.98 1.29* 1.36* 1.18*  -Continue IV fluid -Continue holding diuretics -Recheck in the morning  Hypercalcemia: calcium 11.1.   Likely due to dehydration. -Continue IV fluid -Follow ionized calcium level -Consider further work-up if no improvement  Hyponatremia: Likely due to hypovolemia.  Resolved.  Chronic diastolic CHF: TTE in 11/1515 with LVEF of 50 to 55%, G2 DD, severe LAE, normal RVSP.  She looks dehydrated on exam.  -Continue holding Lasix and spironolactone -Monitor fluid status  Controlled NIDDM-2 with hyperlipidemia: A1c 5.4% on 10/23/2020.  No longer on meds. Recent Labs  Lab 12/04/20 2234 12/05/20 0728 12/05/20 1125  GLUCAP 76 89 101*  -Discontinue CBG monitoring and sliding scale  Hemorrhagic renal cyst/microscopic hematuria: multiple bilateral renal cysts with a hemorrhagic cyst on the right noted on CT abdomen and pelvis.  UA with evidence of microscopic hematuria (21-50 RBCs per high-power field).  H&H stable. -SCD for VT prophylaxis -Outpatient follow-up with urology  Essential hypertension: On hydralazine and diuretics at home. -Resume home hydralazine 10 mg 3 times daily -Continue holding home diuretics.  Anxiety, depression and bipolar disorder: Stable -Resume home Abilify, Prozac and Wellbutrin.  Thrombocytopenia: Likely due to liver cirrhosis -Recheck in the morning  Debility: Lately bedbound.  Maximum assist for transfer from bed to wheelchair.  Body mass index is 26.93 kg/m.        Pressure skin injury: POA -Location-bilateral buttock. -Stage II. -WOCN consulted  DVT prophylaxis:  SCDs Start: 12/04/20 2114  Code Status: Full code Family Communication: Updated patient's son and daughter-in-law over the phone. Level of care: Med-Surg Status is: Inpatient  Remains inpatient appropriate because:Altered mental status, Unsafe d/c plan, IV treatments appropriate due to intensity of illness or inability to take PO and Inpatient  level of care appropriate due to severity of illness   Dispo: The patient is from: ILF              Anticipated d/c is to: LTC              Patient currently is not medically stable to d/c.   Difficult to place patient No       Consultants:  None   Sch Meds:  Scheduled Meds: . Chlorhexidine Gluconate Cloth  6 each Topical Daily  . insulin aspart  0-5 Units Subcutaneous QHS  . insulin aspart  0-9 Units Subcutaneous TID WC  . lactulose  30 g Oral QID   Continuous Infusions: . sodium chloride 75 mL/hr at 12/05/20 1200  . cefTRIAXone (ROCEPHIN)  IV 2 g (12/05/20 1039)  . metronidazole 500 mg (12/05/20 0540)   PRN Meds:.  Antimicrobials: Anti-infectives (From admission, onward)   Start     Dose/Rate Route Frequency Ordered Stop   12/05/20 1000  cefTRIAXone (ROCEPHIN) 2 g in sodium chloride 0.9 % 100 mL IVPB        2 g 200 mL/hr over 30 Minutes Intravenous Every 24 hours 12/05/20 0845     12/04/20 2200  metroNIDAZOLE (FLAGYL) IVPB 500 mg        500 mg 100 mL/hr over 60 Minutes Intravenous Every 8 hours 12/04/20 2153     12/04/20 2115  cefTRIAXone (ROCEPHIN) 1 g in sodium chloride 0.9 % 100 mL IVPB  Status:  Discontinued        1 g 200 mL/hr over 30 Minutes Intravenous Every 24 hours 12/04/20 2115 12/05/20 0845       I have personally reviewed the following labs and images: CBC: Recent Labs  Lab 12/04/20 1851 12/05/20 0816  WBC 5.1 4.4  NEUTROABS 3.2  --   HGB 12.4 13.0  HCT 37.2 38.5  MCV 93.0 92.5  PLT 152 131*   BMP &GFR Recent Labs  Lab 12/04/20 1851 12/05/20 0816  NA 133* 135  K 4.1 4.4  CL 104 108  CO2 22 20*  GLUCOSE 87 87  BUN 34* 32*  CREATININE 1.36* 1.18*  CALCIUM 10.4* 10.2  MG  --  2.1  PHOS  --  2.9   Estimated Creatinine Clearance: 40.7 mL/min (A) (by C-G formula based on SCr of 1.18 mg/dL (H)). Liver & Pancreas: Recent Labs  Lab 12/04/20 1851 12/05/20 0816  AST 48*  --   ALT 31  --   ALKPHOS 118  --   BILITOT 1.4*  --    PROT 6.7  --   ALBUMIN 2.9* 2.7*   No results for input(s): LIPASE, AMYLASE in the last 168 hours. Recent Labs  Lab 12/04/20 1851 12/05/20 0041 12/05/20 0816  AMMONIA 60* 86* 93*   Diabetic: No results for input(s): HGBA1C in the last 72 hours. Recent Labs  Lab 12/04/20 2234 12/05/20 0728 12/05/20 1125  GLUCAP 76 89 101*   Cardiac Enzymes: No results for input(s): CKTOTAL, CKMB, CKMBINDEX, TROPONINI in the last 168 hours. No results for input(s): PROBNP in the last 8760 hours. Coagulation Profile: No results for input(s): INR, PROTIME in the last 168 hours. Thyroid Function Tests: No results for input(s): TSH, T4TOTAL, FREET4, T3FREE, THYROIDAB in the last 72 hours. Lipid Profile: No results for input(s): CHOL, HDL, LDLCALC, TRIG, CHOLHDL, LDLDIRECT in the last 72 hours. Anemia Panel: No results for input(s): VITAMINB12, FOLATE, FERRITIN, TIBC, IRON, RETICCTPCT in the last 72 hours. Urine analysis:    Component Value Date/Time   COLORURINE YELLOW 12/04/2020 [place] 12/04/2020 1851   LABSPEC 1.011 12/04/2020 1851   PHURINE 5.0 12/04/2020 1851   GLUCOSEU NEGATIVE 12/04/2020 1851   HGBUR MODERATE (A) 12/04/2020 1851   BILIRUBINUR NEGATIVE 12/04/2020 1851   [place] 12/04/2020 1851   PROTEINUR NEGATIVE 12/04/2020 1851   NITRITE POSITIVE (A) 12/04/2020 1851   LEUKOCYTESUR LARGE (A) 12/04/2020 1851   Sepsis Labs: Invalid input(s): PROCALCITONIN, [place]  Microbiology: Recent Results (from the past 240 hour(s))  SARS CORONAVIRUS 2 (TAT 6-24 HRS) Nasopharyngeal Nasopharyngeal Swab     Status: None   Collection Time: 12/04/20  9:29 PM   Specimen: Nasopharyngeal Swab  Result Value Ref Range Status   SARS Coronavirus 2 NEGATIVE NEGATIVE Final    Comment: (NOTE) SARS-CoV-2 target nucleic acids are NOT DETECTED.  The SARS-CoV-2 RNA is generally detectable in upper and lower respiratory specimens during the acute phase of infection.  Negative results do not preclude SARS-CoV-2 infection, do not rule out co-infections with other pathogens, and should not be used as the sole basis for treatment or other patient management decisions. Negative results must be combined with clinical observations, patient history, and epidemiological information. The expected result is Negative.  Fact Sheet for Patients: SugarRoll.be  Fact Sheet for Healthcare Providers: https://www.woods-mathews.com/  This test is not yet approved or cleared by the [place] FDA  and  has been authorized for detection and/or diagnosis of SARS-CoV-2 by FDA under an Emergency Use Authorization (EUA). This EUA will remain  in effect (meaning this test can be used) for the duration of the COVID-19 declaration under Se ction 564(b)(1) of the Act, 21 U.S.C. section 360bbb-3(b)(1), unless the authorization is terminated or revoked sooner.  Performed at [facility], [place] [address], [place], [place] [address]   MRSA PCR Screening     Status: None   Collection Time: 12/05/20  2:58 AM   Specimen: Nasopharyngeal  Result Value Ref Range Status   MRSA by PCR NEGATIVE NEGATIVE Final    Comment:        The GeneXpert MRSA Assay (FDA approved for NASAL specimens only), is one component of a comprehensive MRSA colonization surveillance program. It is not intended to diagnose MRSA infection nor to guide or monitor treatment for MRSA infections. Performed at [facility], [place] [address] [place], [place] [address]     Radiology Studies: CT Abdomen Pelvis Wo Contrast  Result Date: 12/04/2020 CLINICAL DATA:  Acute abdominal pain. Confusion beginning last night. History of liver failure. High ammonia levels. EXAM: CT ABDOMEN AND PELVIS WITHOUT CONTRAST TECHNIQUE: Multidetector CT imaging of the abdomen and pelvis was performed following the standard protocol without IV contrast. COMPARISON:   Ultrasound abdomen 10/06/2020. CT abdomen and pelvis 05/24/2019 FINDINGS: Lower chest: Mild dependent changes in the lung bases. Hepatobiliary: Changes of hepatic cirrhosis with enlarged lateral segment left and caudate lobes and atrophy of the right lobe. Diffuse nodular contour to the liver. No focal lesions demonstrated on noncontrast imaging. Gallbladder is surgically absent. No bile duct dilatation. Pancreas: Unremarkable. No pancreatic ductal dilatation or surrounding inflammatory changes. Spleen: Normal in size without focal abnormality. Adrenals/Urinary Tract: No adrenal gland nodules. No hydronephrosis or hydroureter of the kidneys. Multiple bilateral renal cysts. Hyperdense cyst on the right likely represents a hemorrhagic cyst. Bladder is completely decompressed with a Foley catheter. Stomach/Bowel: Stomach is decompressed. Redundant sigmoid colon. Colon is diffusely stool-filled with fluid in the rectum consistent with diarrhea. Wall thickening in the right colon with pericolonic stranding likely representing portal hypertensive colopathy. Focal colitis would be an alternative possibility. No obstruction. Small bowel are mostly decompressed. Vascular/Lymphatic: Diffuse aortic calcification. No aneurysm. No significant lymphadenopathy in the retroperitoneum. Scattered retroperitoneal, porta hepatis, and mesenteric lymph nodes are probably reactive related to cirrhosis. Reproductive: Uterus is surgically absent. No abnormal adnexal lesions. Other: No free air or free fluid in the abdomen. Abdominal wall musculature appears intact. Musculoskeletal: Spondylolysis with mild spondylolisthesis at L5-S1. Degenerative changes in the spine. No destructive bone lesions. IMPRESSION: 1. Changes of hepatic cirrhosis. 2. Wall thickening in the right colon with pericolonic stranding likely representing portal hypertensive colopathy. Focal colitis would be an alternative possibility. 3. Multiple bilateral renal cysts.  Hyperdense cyst on the right likely represents a hemorrhagic cyst. 4. Diffuse aortic calcification. Aortic Atherosclerosis (ICD10-I70.0). Electronically Signed   By: Lucienne Capers M.D.   On: 12/04/2020 19:25   CT Head Wo Contrast  Result Date: 12/04/2020 CLINICAL DATA:  Mental status changes EXAM: CT HEAD WITHOUT CONTRAST TECHNIQUE: Contiguous axial images were obtained from the base of the skull through the vertex without intravenous contrast. COMPARISON:  10/22/2020 FINDINGS: Brain: There is atrophy and chronic small vessel disease changes. No acute intracranial abnormality. Specifically, no hemorrhage, hydrocephalus, mass lesion, acute infarction, or significant intracranial injury. Vascular: No hyperdense vessel or unexpected calcification. Skull: No acute calvarial abnormality. Sinuses/Orbits:  No acute findings Other: None IMPRESSION: Atrophy, chronic microvascular disease. No acute intracranial abnormality. Electronically Signed   By: Rolm Baptise M.D.   On: 12/04/2020 19:15     Brihany Butch T. [place]  If 7PM-7AM, please contact night-coverage [email or web address] 12/05/2020, 12:22 PM

## 2020-12-05 NOTE — Progress Notes (Signed)
Foley catheter changed this morning. Noted pt to have discharge from vagina/ureter?? Brown and yellow sticky mucus with odor noted. Medium amount. MD has been made aware and will examine pt.

## 2020-12-05 NOTE — Evaluation (Signed)
Occupational Therapy Evaluation Patient Details Name: Sheila [name] MRN: [id number] DOB: [birth year] Today's Date: 12/05/2020    History of Present Illness 75 y.o. female with medical history significant of Sheila [name] cirrhosis and hepatic encephalopathy, CAD, chronic diastolic CHF, hypertension, hyperlipidemia, non-insulin-dependent type 2 diabetes, CKD stage IIIa, anemia, bipolar disorder, anxiety, depression, GERD presenting to the ED via EMS for evaluation of confusion. Patient diagnosed with Acute hepatic and metabolic encephalopathy   Clinical Impression   Patient is currently confused, unsure of far from baseline mentation. Patient having difficulty recalling timeline of D/C from rehab and level of assistance needed however does state she can't walk. Called [facility] staff, report since patient D/C from rehab ~1 week ago she has needed extensive assist x2 for all mobility as well as for self care tasks such as dressing, bathing, toileting needs. Currently patient limited by cognition, safety, activity tolerance, strength, balance needing max A to sit up at EOB. Patient initially with posterior lean, needing cues to correct posture however patient frequently closing her eyes and leaning back. Attempted sit to stand from elevated bed height and with sara stedy however patient giving no effort to pull with arms despite multimodal cues and max A from OT. Ultimately patient max A x2 to return to semi-supine.   Will trial acute OT services to try and maximize patient endurance, strength and balance to minimize caregiver burden however this may be patient's new baseline. Recommend D/C back to ALF, if unable to continue to provide current assist levels than patient will need higher level of LTC.     Follow Up Recommendations  Other (comment) (return to ALF vs SNF if unable to provide current levels of assist)    Equipment Recommendations  None recommended by OT       Precautions / Restrictions  Precautions Precautions: Fall Restrictions Weight Bearing Restrictions: No      Mobility Bed Mobility Overal bed mobility: Needs Assistance Bed Mobility: Rolling;Sidelying to Sit;Sit to Supine Rolling: Max assist Sidelying to sit: Max assist;HOB elevated   Sit to supine: Max assist;+2 for physical assistance   General bed mobility comments: patient with significant LE rigidity, needing max cues for following directions to use bed rails to assist with rolling. patient needing max A x1-2 to complete all bed mobility    Transfers                 General transfer comment: unable, attempted sit to stand with stedy and max A x1 however patient does not put forth effort to pull with UEs. Will need a hoyer lift for OOB    Balance Overall balance assessment: Needs assistance Sitting-balance support: Feet supported;Bilateral upper extremity supported Sitting balance-Leahy Scale: Poor Sitting balance - Comments: with cues can correct posterior lean, reliant on UE support       Standing balance comment: unable                           ADL either performed or assessed with clinical judgement   ADL Overall ADL's : Needs assistance/impaired     Grooming: Minimal assistance;Bed level   Upper Body Bathing: Moderate assistance;Bed level;Sitting   Lower Body Bathing: Total assistance;Bed level   Upper Body Dressing : Maximal assistance;Sitting;Bed level   Lower Body Dressing: Total assistance;Sitting/lateral leans;Bed level     Toilet Transfer Details (indicate cue type and reason): unable Toileting- Clothing Manipulation and Hygiene: Total assistance;Bed level       Functional  mobility during ADLs: Maximal assistance General ADL Comments: per ALF staff patient now needing extensive assistance as patient exhibits today with OT, this may be patient's new baseline as she recently D/C from rehab                  Pertinent Vitals/Pain Pain Assessment:  Faces Faces Pain Scale: Hurts little more Pain Location: legs with bed mobility Pain Descriptors / Indicators: Grimacing Pain Intervention(s): Limited activity within patient's tolerance     Hand Dominance Right   Extremity/Trunk Assessment Upper Extremity Assessment Upper Extremity Assessment: Generalized weakness   Lower Extremity Assessment Lower Extremity Assessment: Defer to PT evaluation   Cervical / Trunk Assessment Cervical / Trunk Assessment: Kyphotic   Communication Communication Communication: No difficulties   Cognition Arousal/Alertness: Lethargic Behavior During Therapy: Flat affect Overall Cognitive Status: No family/caregiver present to determine baseline cognitive functioning                                 General Comments: patient keeping her eyes closed needing frequent cues to open. Patient knows shes in the hospital, cannot recall name of Sheila [name], oriented to year needed cue to guess month correctly. Patient having difficult time maintaining train of thought, also with providing accurate history/timeline              Home Living Family/patient expects to be discharged to:: Assisted living                                        Prior Functioning/Environment Level of Independence: Needs assistance  Gait / Transfers Assistance Needed: spoke with staff from [facility], since D/C from rehab ~1 week ago patient needing extensive 2 assist to perform any mobility ADL's / Homemaking Assistance Needed: per staff patient needing total A for ADLs such as dressing, toileting, bathing            OT Problem List: Decreased strength;Decreased activity tolerance;Impaired balance (sitting and/or standing);Decreased cognition;Decreased safety awareness;Pain      OT Treatment/Interventions: Self-care/ADL training;Therapeutic exercise;Therapeutic activities;Cognitive remediation/compensation;Patient/family education;Balance  training    OT Goals(Current goals can be found in the care plan section) Acute Rehab OT Goals Patient Stated Goal: "I can't walk" OT Goal Formulation: With patient Time For Goal Achievement: 12/19/20 Potential to Achieve Goals: Fair  OT Frequency: Min 2X/week    AM-PAC OT "6 Clicks" Daily Activity     Outcome Measure Help from another person eating meals?: A Little Help from another person taking care of personal grooming?: A Little Help from another person toileting, which includes using toliet, bedpan, or urinal?: Total Help from another person bathing (including washing, rinsing, drying)?: A Lot Help from another person to put on and taking off regular upper body clothing?: A Lot Help from another person to put on and taking off regular lower body clothing?: Total 6 Click Score: 12   End of Session Equipment Utilized During Treatment: Gait belt Nurse Communication: Mobility status;Need for lift equipment  Activity Tolerance: Patient limited by fatigue Patient left: in bed;with call bell/phone within reach;with bed alarm set  OT Visit Diagnosis: Other abnormalities of gait and mobility (R26.89);Muscle weakness (generalized) (M62.81);Pain;Other symptoms and signs involving cognitive function Pain - Right/Left:  (bilateral) Pain - part of body: Leg  Time: 1025-8527 OT Time Calculation (min): 28 min Charges:  OT General Charges $OT Visit: 1 Visit OT Evaluation $OT Eval Moderate Complexity: 1 Mod OT Treatments $Self Care/Home Management : 8-22 mins  Delbert Phenix OT OT pager: [email or web address] 12/05/2020, 12:54 PM

## 2020-12-06 DIAGNOSIS — D61818 Other pancytopenia: Secondary | ICD-10-CM

## 2020-12-06 LAB — MAGNESIUM: Magnesium: 1.8 mg/dL (ref 1.7–2.4)

## 2020-12-06 LAB — CALCIUM, IONIZED: Calcium, Ionized, Serum: 6.8 mg/dL — ABNORMAL HIGH (ref 4.5–5.6)

## 2020-12-06 LAB — CBC
HCT: 34.9 % — ABNORMAL LOW (ref 36.0–46.0)
Hemoglobin: 11.6 g/dL — ABNORMAL LOW (ref 12.0–15.0)
MCH: 31.5 pg (ref 26.0–34.0)
MCHC: 33.2 g/dL (ref 30.0–36.0)
MCV: 94.8 fL (ref 80.0–100.0)
Platelets: 120 10*3/uL — ABNORMAL LOW (ref 150–400)
RBC: 3.68 MIL/uL — ABNORMAL LOW (ref 3.87–5.11)
RDW: 23.1 % — ABNORMAL HIGH (ref 11.5–15.5)
WBC: 3.7 10*3/uL — ABNORMAL LOW (ref 4.0–10.5)
nRBC: 0 % (ref 0.0–0.2)

## 2020-12-06 LAB — RENAL FUNCTION PANEL
Albumin: 2.4 g/dL — ABNORMAL LOW (ref 3.5–5.0)
Anion gap: 8 (ref 5–15)
BUN: 25 mg/dL — ABNORMAL HIGH (ref 8–23)
CO2: 19 mmol/L — ABNORMAL LOW (ref 22–32)
Calcium: 9.8 mg/dL (ref 8.9–10.3)
Chloride: 111 mmol/L (ref 98–111)
Creatinine, Ser: 1.18 mg/dL — ABNORMAL HIGH (ref 0.44–1.00)
GFR, Estimated: 48 mL/min — ABNORMAL LOW (ref >60)
Glucose, Bld: 87 mg/dL (ref 70–99)
Phosphorus: 2.7 mg/dL (ref 2.5–4.6)
Potassium: 4.5 mmol/L (ref 3.5–5.1)
Sodium: 138 mmol/L (ref 135–145)

## 2020-12-06 LAB — GLUCOSE, CAPILLARY: Glucose-Capillary: 93 mg/dL (ref 70–99)

## 2020-12-06 LAB — AMMONIA: Ammonia: 55 umol/L — ABNORMAL HIGH (ref 9–35)

## 2020-12-06 MED ORDER — ZINC OXIDE 40 % EX OINT
TOPICAL_OINTMENT | Freq: Two times a day (BID) | CUTANEOUS | Status: DC
  Administered 2020-12-08: 1 via TOPICAL
  Filled 2020-12-06 (×2): qty 57

## 2020-12-06 MED ORDER — ENSURE ENLIVE PO LIQD
237.0000 mL | Freq: Two times a day (BID) | ORAL | Status: DC
  Administered 2020-12-06 – 2020-12-09 (×6): 237 mL via ORAL

## 2020-12-06 MED ORDER — JUVEN PO PACK
1.0000 | PACK | Freq: Two times a day (BID) | ORAL | Status: DC
  Administered 2020-12-06 – 2020-12-08 (×5): 1 via ORAL
  Filled 2020-12-06 (×7): qty 1

## 2020-12-06 MED ORDER — ADULT MULTIVITAMIN W/MINERALS CH
1.0000 | ORAL_TABLET | Freq: Every day | ORAL | Status: DC
  Administered 2020-12-06 – 2020-12-09 (×4): 1 via ORAL
  Filled 2020-12-06 (×4): qty 1

## 2020-12-06 NOTE — Progress Notes (Signed)
Initial Nutrition Assessment  INTERVENTION:   -Ensure Enlive po BID, each supplement provides 350 kcal and 20 grams of protein  -Juven Fruit Punch BID, each serving provides 95kcal and 2.5g of protein (amino acids glutamine and arginine)  -Multivitamin with minerals daily  NUTRITION DIAGNOSIS:   Increased nutrient needs related to wound healing as evidenced by estimated needs.  GOAL:   Patient will meet greater than or equal to 90% of their needs  MONITOR:   PO intake,Supplement acceptance,Labs,Weight trends,I & O's,Skin  REASON FOR ASSESSMENT:   Malnutrition Screening Tool    ASSESSMENT:   75 year old F with PMH of NASH cirrhosis, hepatic encephalopathy, diastolic CHF, and IDDM-2, CKD-3A, HTN, HLD, anemia, GERD, anxiety, depression, bipolar disorder, debility, chronic indwelling Foley and sacral decubitus brought to ED with confusion, poor p.o. intake and vomiting, and admitted for acute hepatic encephalopathy, AKI, dehydration, possible portal hypertensive colopathy versus colitis and possible CAUTI.  Patient receiving patient care at time of visit. Pt chart review, pt is bedbound. Currently alert/oriented x 2.  Pt with poor appetite. Recent history of COVID-19 in February into March. Now with stage 2 pressure injury. Has been consuming 25-50% of meals. Will order Ensure and Juven supplements to aid in wound healing.   Per weight records, pt has lost 29 lbs since 2/7 (15% wt loss x 2 months, significant for time frame).   Medications: Lactulose  Labs reviewed: CBGs: 93-116  NUTRITION - FOCUSED PHYSICAL EXAM:  Unable to complete  Diet Order:   Diet Order            Diet heart healthy/carb modified Room service appropriate? Yes; Fluid consistency: Thin  Diet effective now                 EDUCATION NEEDS:   No education needs have been identified at this time  Skin:  Skin Assessment: Skin Integrity Issues: Skin Integrity Issues:: Stage II Stage II: right,  left buttocks  Last BM:  4/4 -type 7  Height:   Ht Readings from Last 1 Encounters:  12/04/20 5' 5"  (1.651 m)    Weight:   Wt Readings from Last 1 Encounters:  12/06/20 79.7 kg   BMI:  Body mass index is 29.24 kg/m.  Estimated Nutritional Needs:   Kcal:  0802-2336  Protein:  80-95g  Fluid:  1.9L/day  Clayton Bibles, MS, RD, LDN Inpatient Clinical Dietitian Contact information available via Amion

## 2020-12-06 NOTE — Consult Note (Signed)
[facility] Nurse Consult Note: Reason for Consult: Consult requested for sacrum and buttocks.  Pt is currently incontinent of stool and it is becoming trapped underneath the foam dressing.  Wound type: Sacrum and bilat buttocks were noted to have a Stage 2 pressure injury; previous blisters have ruptured and evolved into red moist partial thickness skin loss; affected area is approx 4X4X.1cm.  There is red moist macerated skin surrounding; appearance is consistent with moisture associated skin damage.   Pressure Injury POA: Yes Dressing procedure/placement/frequency: Topical treatment orders provided for bedside nurses to perform as follows to protect from further injury: Apply Desitin to buttocks BID and PRN when turning and cleaning.  Leave foam dressing off to avoid stool being trapped underneath the dressing against skin. Please re-consult if further assistance is needed.  Thank-you,  Julien Girt MSN, [place], [place], [place], [place]

## 2020-12-06 NOTE — Progress Notes (Signed)
PROGRESS NOTE  Sheila [name] OBS:[id number] DOB: July 09, 1946   PCP: Chesley Noon, MD  Patient is from:[facility].  Bedbound except for transfer to wheelchair using lift at baseline.  DOA: 12/04/2020 LOS: 2  Chief complaints: Confusion  Brief Narrative / Interim history: 75 year old F with PMH of NASH cirrhosis, hepatic encephalopathy, diastolic CHF, and IDDM-2, CKD-3A, HTN, HLD, anemia, GERD, anxiety, depression, bipolar disorder, debility, chronic indwelling Foley and sacral decubitus brought to ED with confusion, poor p.o. intake and vomiting, and admitted for acute hepatic encephalopathy, AKI, dehydration, possible portal hypertensive colopathy versus colitis and possible CAUTI.  Started on IV fluid, lactulose, ceftriaxone and Flagyl.  Urine culture with GNR.  Subjective: Seen and examined earlier this morning.  No major events overnight or this morning.  She said she had an episode of nausea and small emesis after lactulose.  Denies chest pain or dyspnea.  Denies abdominal pain but tenderness with palpation.  Objective: Vitals:   12/05/20 2041 12/06/20 0442 12/06/20 0444 12/06/20 0822  BP: (!) 151/71 (!) 158/76  (!) 162/85  Pulse: 96 83  92  Resp: 16 20    Temp: 98.2 F (36.8 C) 97.9 F (36.6 C)    TempSrc: Oral Oral    SpO2: 98% 97%    Weight:   79.7 kg   Height:        Intake/Output Summary (Last 24 hours) at 12/06/2020 1221 Last data filed at 12/06/2020 0600 Gross per 24 hour  Intake 2403.81 ml  Output --  Net 2403.81 ml   Filed Weights   12/04/20 2255 12/05/20 0500 12/06/20 0444  Weight: 73.4 kg 73.4 kg 79.7 kg    Examination:  GENERAL: No apparent distress.  Nontoxic. HEENT: MMM.  Vision and hearing grossly intact.  NECK: Supple.  No apparent JVD.  RESP: On RA.  No IWOB.  Fair aeration bilaterally. CVS:  RRR. Heart sounds normal.  ABD/GI/GU: BS+. Abd soft.  Mild diffuse tenderness. MSK/EXT:  Moves extremities.  Bilateral lower extremity  weakness. SKIN: Stage II bilateral buttock wound.  No signs of infection.  See picture in media for more. NEURO: Awake and alert.  Oriented to self, "hospital", city, state and year.  No apparent focal neuro deficit other than generalized weakness PSYCH: Calm. Normal affect.   RN, Gunnar Fusi present as chaperone during exam. Procedures:  4/3-indwelling Foley catheter change  Microbiology summarized: COVID-19 and influenza PCR nonreactive. Urine culture with E. coli. Blood culture pending.  Assessment & Plan: Acute hepatic and metabolic encephalopathy: Mental status improved but ammonia trended down. Urine culture with E. Coli. No focal neuro deficit other than generalized weakness.  CT head without acute finding. -Continue lactulose 30 g 4 times daily -Discontinue IV fluid -Continue treatment for UTI and possible colitis -Follow urine and blood cultures. -Continue trending ammonia-downtrending.  Decompensated NASH cirrhosis with hepatic encephalopathy:  -Lactulose as above -Continue holding Lasix and Aldactone in the setting of AKI and dehydration -Discontinue IV fluid  CAUTI: this is difficult diagnosis and have to go by urinalysis and urine culture.  She has LLQ tenderness as well.  Urine culture with E. Coli. Changed indwelling Foley catheter on 4/3 -Continue ceftriaxone pending urine culture sensitivity  Possible colitis: CT findings suspicious for portal hypertensive colopathy versus focal right colitis.  Initially, tenderness over LLQ.  No mild diffuse tenderness but no rebound.  Already on ceftriaxone for possible UTI. -Continue Flagy  AKI on CKD-3A/azotemia: Likely due to dehydration and ongoing use of diuretics.  Improving.  Recent Labs    10/26/20 0118 10/27/20 0217 10/28/20 0358 10/29/20 0310 10/30/20 0132 10/31/20 0352 11/02/20 0127 12/04/20 1851 12/05/20 0816 12/06/20 0033  BUN 12 13 15 12 13 13 16  34* 32* 25*  CREATININE 0.90 0.83 0.93 0.94 0.93  0.98 1.29* 1.36* 1.18* 1.18*  -Continue holding diuretics -Discontinue IV fluid -Recheck in the morning  Hypercalcemia: calcium 11.   Likely due to dehydration. -Follow ionized calcium level -Consider further work-up if no improvement  Hyponatremia: Likely due to hypovolemia.  Resolved.  Chronic diastolic CHF: TTE in 12/2351 with LVEF of 50 to 55%, G2 DD, severe LAE, normal RVSP.  Appears euvolemic.  INR incomplete. -Continue holding Lasix and spironolactone -Monitor fluid status  Controlled NIDDM-2 with hyperlipidemia: A1c 5.4% on 10/23/2020.  No longer on meds. Recent Labs  Lab 12/04/20 2234 12/05/20 0728 12/05/20 1125 12/05/20 1557 12/05/20 2044  GLUCAP 76 89 101* 116* 93  -Discontinued CBG monitoring and sliding scale  Hemorrhagic renal cyst/microscopic hematuria: multiple bilateral renal cysts with a hemorrhagic cyst on the right noted on CT abdomen and pelvis.  UA with evidence of microscopic hematuria (21-50 RBCs per high-power field).  Mild drop in Hgb likely dilutional from IV fluids and holding diuretics. -SCD for VTE prophylaxis -Outpatient follow-up with urology  Essential hypertension: On hydralazine and diuretics at home. -Resume home hydralazine 10 mg 3 times daily -Continue holding home diuretics.  Anxiety, depression and bipolar disorder: Stable -Continue home Abilify, Prozac and Wellbutrin.  Pancytopenia: Likely dilutional from IV fluid -Continue monitoring  Debility: Lately bedbound.  Maximum assist for transfer from bed to wheelchair.  Body mass index is 29.24 kg/m.         Pressure skin injury: POA -Appreciate input by WOCN.    Pressure Injury 12/04/20 Buttocks Right;Left Stage 2 -  Partial thickness loss of dermis presenting as a shallow open injury with a red, pink wound bed without slough. (Active)  12/04/20 2230  Location: Buttocks  Location Orientation: Right;Left  Staging: Stage 2 -  Partial thickness loss of dermis presenting as a  shallow open injury with a red, pink wound bed without slough.  Wound Description (Comments):   Present on Admission: Yes       DVT prophylaxis:  SCDs Start: 12/04/20 2114  Code Status: Full code Family Communication: Updated patient's son over the phone. Level of care: Med-Surg Status is: Inpatient  Remains inpatient appropriate because:Altered mental status, Unsafe d/c plan, IV treatments appropriate due to intensity of illness or inability to take PO and Inpatient level of care appropriate due to severity of illness   Dispo: The patient is from: ILF              Anticipated d/c is to: LTC              Patient currently is not medically stable to d/c.   Difficult to place patient No       Consultants:  None   Sch Meds:  Scheduled Meds: . ARIPiprazole  10 mg Oral QHS  . buPROPion  300 mg Oral q morning  . carvedilol  6.25 mg Oral BID WC  . Chlorhexidine Gluconate Cloth  6 each Topical Daily  . ezetimibe  10 mg Oral Daily  . FLUoxetine  40 mg Oral q morning  . lactulose  30 g Oral QID  . liver oil-zinc oxide   Topical BID   Continuous Infusions: . cefTRIAXone (ROCEPHIN)  IV 2 g (12/06/20 1056)  . metronidazole 500 mg (  12/06/20 0534)   PRN Meds:.  Antimicrobials: Anti-infectives (From admission, onward)   Start     Dose/Rate Route Frequency Ordered Stop   12/05/20 1000  cefTRIAXone (ROCEPHIN) 2 g in sodium chloride 0.9 % 100 mL IVPB        2 g 200 mL/hr over 30 Minutes Intravenous Every 24 hours 12/05/20 0845     12/04/20 2200  metroNIDAZOLE (FLAGYL) IVPB 500 mg        500 mg 100 mL/hr over 60 Minutes Intravenous Every 8 hours 12/04/20 2153     12/04/20 2115  cefTRIAXone (ROCEPHIN) 1 g in sodium chloride 0.9 % 100 mL IVPB  Status:  Discontinued        1 g 200 mL/hr over 30 Minutes Intravenous Every 24 hours 12/04/20 2115 12/05/20 0845       I have personally reviewed the following labs and images: CBC: Recent Labs  Lab 12/04/20 1851 12/05/20 0816  12/06/20 0033  WBC 5.1 4.4 3.7*  NEUTROABS 3.2  --   --   HGB 12.4 13.0 11.6*  HCT 37.2 38.5 34.9*  MCV 93.0 92.5 94.8  PLT 152 131* 120*   BMP &GFR Recent Labs  Lab 12/04/20 1851 12/05/20 0816 12/06/20 0033  NA 133* 135 138  K 4.1 4.4 4.5  CL 104 108 111  CO2 22 20* 19*  GLUCOSE 87 87 87  BUN 34* 32* 25*  CREATININE 1.36* 1.18* 1.18*  CALCIUM 10.4* 10.2 9.8  MG  --  2.1 1.8  PHOS  --  2.9 2.7   Estimated Creatinine Clearance: 42.3 mL/min (A) (by C-G formula based on SCr of 1.18 mg/dL (H)). Liver & Pancreas: Recent Labs  Lab 12/04/20 1851 12/05/20 0816 12/06/20 0033  AST 48*  --   --   ALT 31  --   --   ALKPHOS 118  --   --   BILITOT 1.4*  --   --   PROT 6.7  --   --   ALBUMIN 2.9* 2.7* 2.4*   No results for input(s): LIPASE, AMYLASE in the last 168 hours. Recent Labs  Lab 12/04/20 1851 12/05/20 0041 12/05/20 0816 12/06/20 0927  AMMONIA 60* 86* 93* 55*   Diabetic: No results for input(s): HGBA1C in the last 72 hours. Recent Labs  Lab 12/04/20 2234 12/05/20 0728 12/05/20 1125 12/05/20 1557 12/05/20 2044  GLUCAP 76 89 101* 116* 93   Cardiac Enzymes: No results for input(s): CKTOTAL, CKMB, CKMBINDEX, TROPONINI in the last 168 hours. No results for input(s): PROBNP in the last 8760 hours. Coagulation Profile: No results for input(s): INR, PROTIME in the last 168 hours. Thyroid Function Tests: No results for input(s): TSH, T4TOTAL, FREET4, T3FREE, THYROIDAB in the last 72 hours. Lipid Profile: No results for input(s): CHOL, HDL, LDLCALC, TRIG, CHOLHDL, LDLDIRECT in the last 72 hours. Anemia Panel: No results for input(s): VITAMINB12, FOLATE, FERRITIN, TIBC, IRON, RETICCTPCT in the last 72 hours. Urine analysis:    Component Value Date/Time   COLORURINE YELLOW 12/04/2020 [place] 12/04/2020 1851   LABSPEC 1.011 12/04/2020 1851   PHURINE 5.0 12/04/2020 1851   GLUCOSEU NEGATIVE 12/04/2020 1851   HGBUR MODERATE (A) 12/04/2020 1851    BILIRUBINUR NEGATIVE 12/04/2020 1851   [place] 12/04/2020 1851   PROTEINUR NEGATIVE 12/04/2020 1851   NITRITE POSITIVE (A) 12/04/2020 1851   LEUKOCYTESUR LARGE (A) 12/04/2020 1851   Sepsis Labs: Invalid input(s): PROCALCITONIN, [place]  Microbiology: Recent Results (from the past 240 hour(s))  Urine culture     Status: Abnormal (Preliminary result)   Collection Time: 12/04/20  6:51 PM   Specimen: Urine, Clean Catch  Result Value Ref Range Status   Specimen Description   Final    URINE, CLEAN CATCH Performed at [facility], [place] [address] [place], [place] [address]    Special Requests   Final    NONE Performed at [facility], [place] [address] [place], [place] [address]    Culture (A)  Final    >=100,000 COLONIES/mL ESCHERICHIA COLI SUSCEPTIBILITIES TO FOLLOW Performed at [facility], [place] [address], [place],  [address]    Report Status PENDING  Incomplete  SARS CORONAVIRUS 2 (TAT 6-24 HRS) Nasopharyngeal Nasopharyngeal Swab     Status: None   Collection Time: 12/04/20  9:29 PM   Specimen: Nasopharyngeal Swab  Result Value Ref Range Status   SARS Coronavirus 2 NEGATIVE NEGATIVE Final    Comment: (NOTE) SARS-CoV-2 target nucleic acids are NOT DETECTED.  The SARS-CoV-2 RNA is generally detectable in upper and lower respiratory specimens during the acute phase of infection. Negative results do not preclude SARS-CoV-2 infection, do not rule out co-infections with other pathogens, and should not be used as the sole basis for treatment or other patient management decisions. Negative results must be combined with clinical observations, patient history, and epidemiological information. The expected result is Negative.  Fact Sheet for Patients: SugarRoll.be  Fact Sheet for Healthcare Providers: https://www.woods-mathews.com/  This test is not yet approved or cleared  by the [place] FDA and  has been authorized for detection and/or diagnosis of SARS-CoV-2 by FDA under an Emergency Use Authorization (EUA). This EUA will remain  in effect (meaning this test can be used) for the duration of the COVID-19 declaration under Se ction 564(b)(1) of the Act, 21 U.S.C. section 360bbb-3(b)(1), unless the authorization is terminated or revoked sooner.  Performed at [facility], [place] [address] [place],  [address]   MRSA PCR Screening     Status: None   Collection Time: 12/05/20  2:58 AM   Specimen: Nasopharyngeal  Result Value Ref Range Status   MRSA by PCR NEGATIVE NEGATIVE Final    Comment:        The GeneXpert MRSA Assay (FDA approved for NASAL specimens only), is one component of a comprehensive MRSA colonization surveillance program. It is not intended to diagnose MRSA infection nor to guide or monitor treatment for MRSA infections. Performed at [facility], [place] [address] [place],  [address]     Radiology Studies: No results found.   Kuba Shepherd T. [place]  If 7PM-7AM, please contact night-coverage [email or web address] 12/06/2020, 12:21 PM

## 2020-12-06 NOTE — Evaluation (Signed)
Physical Therapy Evaluation Patient Details Name: Sheila [name] MRN: [id number] DOB: 03-Dec-1945 Today's Date: 12/06/2020   History of Present Illness  75 y.o. female with medical history significant of [name] cirrhosis and hepatic encephalopathy, CAD, chronic diastolic CHF, hypertension, hyperlipidemia, non-insulin-dependent type 2 diabetes, CKD stage IIIa, anemia, bipolar disorder, anxiety, depression, GERD presenting to the ED via EMS for evaluation of confusion. Patient diagnosed with Acute hepatic and metabolic encephalopathy  Clinical Impression  Evaluation limited as patient recently had lactulose and incontinent of BM. Assisted with bed mobility to get cleaned. Patient reports very sore buttocks .  Patient with noted plantarflexion contractures. Patient requires mechanical lift for Out of bed.  Patient requires higher level of care it appears. Return to functional  Mobility appears guarded. Pt admitted with above diagnosis.Marland KitchensignPt currently with functional limitations due to the deficits listed below (see PT Problem List). Pt will benefit from skilled PT to increase their independence and safety with mobility to allow discharge to the venue listed below.       Follow Up Recommendations SNF (higher level of care.)    Equipment Recommendations  None recommended by PT    Recommendations for Other Services       Precautions / Restrictions Precautions Precautions: Fall Precaution Comments: takes lactulose      Mobility  Bed Mobility Overal bed mobility: Needs Assistance Bed Mobility: Rolling Rolling: Max assist         General bed mobility comments: patient does initiate reaching for rail to roll multiple times.    Transfers                 General transfer comment: made attempt, patient declining to sit up, reports buttock sore at wound.  Ambulation/Gait                Stairs            Wheelchair Mobility    Modified Rankin (Stroke Patients  Only)       Balance                                             Pertinent Vitals/Pain Faces Pain Scale: Hurts even more Pain Location: legs with bed mobility, rectum, sore Pain Descriptors / Indicators: Grimacing;Moaning;Discomfort Pain Intervention(s): Limited activity within patient's tolerance;Monitored during session;Repositioned    Home Living Family/patient expects to be discharged to:: Assisted living                 Additional Comments: since return to ALF, has been total care    Prior Function     Gait / Transfers Assistance Needed: spoke with staff from [facility], since D/C from rehab ~1 week ago patient needing extensive 2 assist to perform any mobility  ADL's / Homemaking Assistance Needed: per staff patient needing total A for ADLs such as dressing, toileting, bathing        Hand Dominance        Extremity/Trunk Assessment   Upper Extremity Assessment Upper Extremity Assessment: Generalized weakness    Lower Extremity Assessment Lower Extremity Assessment: RLE deficits/detail;LLE deficits/detail RLE Deficits / Details: ankles plantarflexed, lifts leg from bed, flexes knee and hip with assistance. LLE Deficits / Details: ankle plantar flexed contracture.lifts leg, flexes knee and hip with assistance       Communication      Cognition   Behavior During Therapy: [facility] for  tasks assessed/performed Overall Cognitive Status: No family/caregiver present to determine baseline cognitive functioning                                 General Comments: patient oriented to self. States she is trying to call her husband.      General Comments      Exercises     Assessment/Plan    PT Assessment Patient needs continued PT services  PT Problem List Decreased strength;Decreased mobility;Decreased activity tolerance;Decreased cognition;Decreased knowledge of use of DME;Decreased balance;Decreased skin integrity;Pain        PT Treatment Interventions Functional mobility training;Therapeutic exercise;Therapeutic activities;Cognitive remediation;Patient/family education    PT Goals (Current goals can be found in the Care Plan section)  Acute Rehab PT Goals Patient Stated Goal: "I can't walk" PT Goal Formulation: Patient unable to participate in goal setting Time For Goal Achievement: 12/20/20 Potential to Achieve Goals: Poor    Frequency Min 2X/week   Barriers to discharge Decreased caregiver support      Co-evaluation               AM-PAC PT "6 Clicks" Mobility  Outcome Measure Help needed turning from your back to your side while in a flat bed without using bedrails?: Total Help needed moving from lying on your back to sitting on the side of a flat bed without using bedrails?: Total Help needed moving to and from a bed to a chair (including a wheelchair)?: Total Help needed standing up from a chair using your arms (e.g., wheelchair or bedside chair)?: Total Help needed to walk in hospital room?: Total Help needed climbing 3-5 steps with a railing? : Total 6 Click Score: 6    End of Session   Activity Tolerance: Patient limited by fatigue;Patient limited by pain Patient left: in bed;with call bell/phone within reach;with bed alarm set Nurse Communication: Mobility status;Need for lift equipment PT Visit Diagnosis: Pain;Muscle weakness (generalized) (M62.81)    Time: 3435-6861 PT Time Calculation (min) (ACUTE ONLY): 18 min   Charges:   PT Evaluation $PT Eval Low Complexity: [facility] PT Acute Rehabilitation Services Pager [phone] Office [phone]    Claretha Cooper 12/06/2020, 1:52 PM

## 2020-12-06 NOTE — TOC Initial Note (Signed)
Transition of Care [facility]) - Initial/Assessment Note    Patient Details  Name: Sheila [name] MRN: [id number] Date of Birth: 22-Dec-1945  Transition of Care [facility]) CM/SW Contact:    Trish Mage, LCSW Phone Number: 12/06/2020, 2:47 PM  Clinical Narrative:   Patient seen in follow up to PT/OT recommendation of SNF.  Found Ms Messner in room with son visiting at bedside.  They explained that coming home from [facility] a week and a half ago was too soon as she had not yet regained her mobility, have determined that she currently needs more help then she can get at [facility], and would like her to return to SNF level of care for more therapy.  They talked about having the means to pay for co-pay days for rehab, and also LTC insurance policy. According to [place] at [facility], returning there will not be an option.  Bed search initiated. TOC will continue to follow during the course of hospitalization.                 Expected Discharge Plan: Skilled Nursing Facility Barriers to Discharge: SNF Pending bed offer   Patient Goals and CMS Choice     Choice offered to / list presented to : [place]  Expected Discharge Plan and Services Expected Discharge Plan: [place] Acute Care Choice: [place] Living arrangements for the past 2 months: [place]                                      Prior Living Arrangements/Services Living arrangements for the past 2 months: [place] Lives with:: Facility Resident Patient language and need for interpreter reviewed:: Yes        Need for Family Participation in Patient Care: Yes (Comment) Care giver support system in place?: Yes (comment)   Criminal Activity/Legal Involvement Pertinent to Current Situation/Hospitalization: No - Comment as needed  Activities of Daily Living Home Assistive Devices/Equipment: Gilford Rile (specify type) ADL Screening (condition at  time of admission) Patient's cognitive ability adequate to safely complete daily activities?: No Is the patient deaf or have difficulty hearing?: No Does the patient have difficulty seeing, even when wearing glasses/contacts?: No Does the patient have difficulty concentrating, remembering, or making decisions?: Yes Patient able to express need for assistance with ADLs?: Yes Does the patient have difficulty dressing or bathing?: Yes Independently performs ADLs?: No Communication: Independent Dressing (OT): Needs assistance Is this a change from baseline?: Pre-admission baseline Grooming: Needs assistance Is this a change from baseline?: Pre-admission baseline Feeding: Independent Bathing: Needs assistance Is this a change from baseline?: Pre-admission baseline Toileting: Needs assistance Is this a change from baseline?: Pre-admission baseline In/Out Bed: Needs assistance Is this a change from baseline?: Pre-admission baseline Walks in Home: Needs assistance Is this a change from baseline?: Pre-admission baseline Does the patient have difficulty walking or climbing stairs?: Yes Weakness of Legs: Both Weakness of Arms/Hands: None  Permission Sought/Granted Permission sought to share information with : Family Supports Permission granted to share information with : Yes, Verbal Permission Granted  Share Information with NAME: Gregery Na (Son)   [phone]           Emotional Assessment Appearance:: Appears stated age Attitude/Demeanor/Rapport: Engaged Affect (typically observed): Appropriate Orientation: : Oriented to Self,Oriented to Place Alcohol / Substance Use: Not Applicable Psych Involvement: No (comment)  Admission diagnosis:  Hepatic encephalopathy (HCC) [K72.90] Urinary tract infection without hematuria, site unspecified [N39.0] Altered mental status, unspecified altered mental status type [O84.16] Acute metabolic encephalopathy [S06.30] Patient Active Problem  List   Diagnosis Date Noted  . Acute metabolic encephalopathy 16/09/930  . UTI (urinary tract infection) 12/04/2020  . Colitis 12/04/2020  . Hypokalemia 10/23/2020  . COVID-19 virus infection 10/23/2020  . Type 2 diabetes mellitus ([place]) 10/23/2020  . Pressure injury of skin 02/11/2020  . Acute hepatic encephalopathy 02/09/2020  . Esophageal varices determined by endoscopy ([place]) 06/30/2019  . Portal hypertensive gastropathy ([place]) 06/30/2019  . Elevated troponin   . Demand ischemia ([place])   . Essential hypertension   . NSTEMI (non-ST elevated myocardial infarction) ([place]) 05/23/2019  . Chest pain 05/22/2019  . Hepatic encephalopathy ([place])   . Liver cirrhosis ([place])   . NASH (nonalcoholic steatohepatitis)   . Weakness 05/13/2019  . AKI (acute kidney injury) ([place]) 05/13/2019   PCP:  Chesley Noon, MD Pharmacy:   [place] [place] [address] Phone: [phone] Fax: [phone]     Social Determinants of Health (SDOH) Interventions    Readmission Risk Interventions Readmission Risk Prevention Plan 05/15/2019  Post Dischage Appt Complete  Medication Screening Complete  Transportation Screening Complete

## 2020-12-06 NOTE — NC FL2 (Signed)
[place] LEVEL OF CARE SCREENING TOOL     IDENTIFICATION  Patient Name: Sheila [name] Birthdate: [birth year] Sex: female Admission Date (Current Location): 12/04/2020  [facility] and [place] Number:  Herbalist and Address:  [facility],  [place] [address], [place]      Provider Number: [id number]  Attending Physician Name and Address:  Mercy Riding, MD  Relative Name and Phone Number:  Gregery Na [facility])   [phone]    Current Level of Care: Hospital Recommended Level of Care: [place] Prior Approval Number:    Date Approved/Denied:   PASRR Number: [id number]  Discharge Plan: SNF    Current Diagnoses: Patient Active Problem List   Diagnosis Date Noted  . Acute metabolic encephalopathy 17/40/8144  . UTI (urinary tract infection) 12/04/2020  . Colitis 12/04/2020  . Hypokalemia 10/23/2020  . COVID-19 virus infection 10/23/2020  . Type 2 diabetes mellitus ([place]) 10/23/2020  . Pressure injury of skin 02/11/2020  . Acute hepatic encephalopathy 02/09/2020  . Esophageal varices determined by endoscopy ([place]) 06/30/2019  . Portal hypertensive gastropathy ([place]) 06/30/2019  . Elevated troponin   . Demand ischemia ([place])   . Essential hypertension   . NSTEMI (non-ST elevated myocardial infarction) ([place]) 05/23/2019  . Chest pain 05/22/2019  . Hepatic encephalopathy ([place])   . Liver cirrhosis ([place])   . NASH (nonalcoholic steatohepatitis)   . Weakness 05/13/2019  . AKI (acute kidney injury) ([place]) 05/13/2019    Orientation RESPIRATION BLADDER Height & Weight     Self,Place  Normal Indwelling catheter Weight: 79.7 kg Height:  5' 5"  (165.1 cm)  BEHAVIORAL SYMPTOMS/MOOD NEUROLOGICAL BOWEL NUTRITION STATUS   (none)  (none) Incontinent Diet (see d/c summary)  AMBULATORY STATUS COMMUNICATION OF NEEDS Skin   Total Care Verbally Other (Comment) (Pressure injury, L buttock)                       Personal Care  Assistance Level of Assistance  Bathing,Feeding,Dressing Bathing Assistance: Maximum assistance Feeding assistance: Limited assistance Dressing Assistance: Maximum assistance     Functional Limitations Info  Sight,Hearing,Speech Sight Info: Adequate Hearing Info: Adequate Speech Info: Adequate    SPECIAL CARE FACTORS FREQUENCY  PT (By licensed PT)     PT Frequency: 5X/W              Contractures Contractures Info: Not present    Additional Factors Info  Code Status,Allergies Code Status Info: full Allergies Info: Acetaminophen   Cefazolin   Ciprofloxacin Hcl   Ibuprofen   Sulfa Antibiotics   Naproxen Sodium   Amoxicillin           Current Medications (12/06/2020):  This is the current hospital active medication list Current Facility-Administered Medications  Medication Dose Route Frequency Provider Last Rate Last Admin  . ARIPiprazole (ABILIFY) tablet 10 mg  10 mg Oral QHS Wendee Beavers T, MD   10 mg at 12/05/20 2131  . buPROPion (WELLBUTRIN XL) 24 hr tablet 300 mg  300 mg Oral q morning Wendee Beavers T, MD   300 mg at 12/06/20 1052  . carvedilol (COREG) tablet 6.25 mg  6.25 mg Oral BID WC Wendee Beavers T, MD   6.25 mg at 12/06/20 8185  . cefTRIAXone (ROCEPHIN) 2 g in sodium chloride 0.9 % 100 mL IVPB  2 g Intravenous Q24H Gonfa, Taye T, MD 200 mL/hr at 12/06/20 1056 2 g at 12/06/20 1056  . Chlorhexidine Gluconate Cloth 2 % PADS  6 each  6 each Topical Daily Mercy Riding, MD   6 each at 12/06/20 1018  . ezetimibe (ZETIA) tablet 10 mg  10 mg Oral Daily Wendee Beavers T, MD   10 mg at 12/06/20 1052  . feeding supplement (ENSURE ENLIVE / ENSURE PLUS) liquid 237 mL  237 mL Oral BID BM Gonfa, Taye T, MD      . FLUoxetine (PROZAC) capsule 40 mg  40 mg Oral q morning Gonfa, Taye T, MD   40 mg at 12/06/20 1052  . lactulose (CHRONULAC) 10 GM/15ML solution 30 g  30 g Oral QID Wendee Beavers T, MD   30 g at 12/06/20 1052  . liver oil-zinc oxide (DESITIN) 40 % ointment   Topical BID Mercy Riding, MD   Given at 12/06/20 1053  . metroNIDAZOLE (FLAGYL) IVPB 500 mg  500 mg Intravenous Q8H Shela Leff, MD 100 mL/hr at 12/06/20 1414 500 mg at 12/06/20 1414  . multivitamin with minerals tablet 1 tablet  1 tablet Oral Daily Gonfa, Charlesetta Ivory, MD      . nutrition supplement (JUVEN) (JUVEN) powder packet 1 packet  1 packet Oral BID BM Mercy Riding, MD         Discharge Medications: Please see discharge summary for a list of discharge medications.  Relevant Imaging Results:  Relevant Lab Results:   Additional Information SSN# [id number],  Sheila [name], Sheila [name]

## 2020-12-07 DIAGNOSIS — Z515 Encounter for palliative care: Secondary | ICD-10-CM

## 2020-12-07 DIAGNOSIS — K529 Noninfective gastroenteritis and colitis, unspecified: Secondary | ICD-10-CM

## 2020-12-07 DIAGNOSIS — Z7189 Other specified counseling: Secondary | ICD-10-CM

## 2020-12-07 DIAGNOSIS — G9341 Metabolic encephalopathy: Secondary | ICD-10-CM

## 2020-12-07 DIAGNOSIS — K7290 Hepatic failure, unspecified without coma: Secondary | ICD-10-CM

## 2020-12-07 DIAGNOSIS — N390 Urinary tract infection, site not specified: Secondary | ICD-10-CM

## 2020-12-07 LAB — FOLATE: Folate: 9.3 ng/mL (ref >5.9)

## 2020-12-07 LAB — URINE CULTURE: Culture: >=100000 — AB

## 2020-12-07 LAB — CBC WITH DIFFERENTIAL/PLATELET
Abs Immature Granulocytes: 0.02 10*3/uL (ref 0.00–0.07)
Basophils Absolute: 0.1 10*3/uL (ref 0.0–0.1)
Basophils Relative: 2 %
Eosinophils Absolute: 0.2 10*3/uL (ref 0.0–0.5)
Eosinophils Relative: 6 %
HCT: 34.5 % — ABNORMAL LOW (ref 36.0–46.0)
Hemoglobin: 11.1 g/dL — ABNORMAL LOW (ref 12.0–15.0)
Immature Granulocytes: 1 %
Lymphocytes Relative: 17 %
Lymphs Abs: 0.7 10*3/uL (ref 0.7–4.0)
MCH: 30.5 pg (ref 26.0–34.0)
MCHC: 32.2 g/dL (ref 30.0–36.0)
MCV: 94.8 fL (ref 80.0–100.0)
Monocytes Absolute: 0.7 10*3/uL (ref 0.1–1.0)
Monocytes Relative: 18 %
Neutro Abs: 2.2 10*3/uL (ref 1.7–7.7)
Neutrophils Relative %: 56 %
Platelets: 116 10*3/uL — ABNORMAL LOW (ref 150–400)
RBC: 3.64 MIL/uL — ABNORMAL LOW (ref 3.87–5.11)
WBC: 3.9 10*3/uL — ABNORMAL LOW (ref 4.0–10.5)
nRBC: 0 % (ref 0.0–0.2)

## 2020-12-07 LAB — COMPREHENSIVE METABOLIC PANEL
ALT: 25 U/L (ref 0–44)
AST: 48 U/L — ABNORMAL HIGH (ref 15–41)
Albumin: 2.3 g/dL — ABNORMAL LOW (ref 3.5–5.0)
Alkaline Phosphatase: 102 U/L (ref 38–126)
Anion gap: 5 (ref 5–15)
BUN: 23 mg/dL (ref 8–23)
CO2: 22 mmol/L (ref 22–32)
Calcium: 9.5 mg/dL (ref 8.9–10.3)
Chloride: 113 mmol/L — ABNORMAL HIGH (ref 98–111)
Creatinine, Ser: 0.97 mg/dL (ref 0.44–1.00)
GFR, Estimated: >60 mL/min (ref >60)
Glucose, Bld: 70 mg/dL (ref 70–99)
Potassium: 4.1 mmol/L (ref 3.5–5.1)
Sodium: 140 mmol/L (ref 135–145)
Total Bilirubin: 0.8 mg/dL (ref 0.3–1.2)
Total Protein: 5.5 g/dL — ABNORMAL LOW (ref 6.5–8.1)

## 2020-12-07 LAB — RETICULOCYTES
Immature Retic Fract: 23.7 % — ABNORMAL HIGH (ref 2.3–15.9)
RBC.: 3.56 MIL/uL — ABNORMAL LOW (ref 3.87–5.11)
Retic Count, Absolute: 76.9 10*3/uL (ref 19.0–186.0)
Retic Ct Pct: 2.2 % (ref 0.4–3.1)

## 2020-12-07 LAB — VITAMIN B12: Vitamin B-12: 259 pg/mL (ref 180–914)

## 2020-12-07 LAB — FERRITIN: Ferritin: 252 ng/mL (ref 11–307)

## 2020-12-07 LAB — AMMONIA: Ammonia: 30 umol/L (ref 9–35)

## 2020-12-07 LAB — TSH: TSH: 2.508 u[IU]/mL (ref 0.350–4.500)

## 2020-12-07 LAB — IRON AND TIBC
Iron: 74 ug/dL (ref 28–170)
Saturation Ratios: 37 % — ABNORMAL HIGH (ref 10.4–31.8)
TIBC: 200 ug/dL — ABNORMAL LOW (ref 250–450)
UIBC: 126 ug/dL

## 2020-12-07 LAB — SARS CORONAVIRUS 2 (TAT 6-24 HRS): SARS Coronavirus 2: NEGATIVE

## 2020-12-07 LAB — PHOSPHORUS: Phosphorus: 1.9 mg/dL — ABNORMAL LOW (ref 2.5–4.6)

## 2020-12-07 LAB — MAGNESIUM: Magnesium: 1.8 mg/dL (ref 1.7–2.4)

## 2020-12-07 MED ORDER — CEFAZOLIN SODIUM-DEXTROSE 2-4 GM/100ML-% IV SOLN
2.0000 g | Freq: Three times a day (TID) | INTRAVENOUS | Status: DC
  Administered 2020-12-08 – 2020-12-09 (×4): 2 g via INTRAVENOUS
  Filled 2020-12-07 (×5): qty 100

## 2020-12-07 MED ORDER — LACTULOSE 10 GM/15ML PO SOLN
30.0000 g | Freq: Three times a day (TID) | ORAL | Status: DC
  Administered 2020-12-07: 30 g via ORAL
  Filled 2020-12-07 (×4): qty 45

## 2020-12-07 NOTE — Progress Notes (Signed)
PROGRESS NOTE  Sheila [name] TIW:[id number] DOB: [birth year]   PCP: Chesley Noon, MD  Patient is from:[facility].  Bedbound except for transfer to wheelchair using lift at baseline.  DOA: 12/04/2020 LOS: 3  Chief complaints: Confusion  Brief Narrative / Interim history: 75 year old F with PMH of NASH cirrhosis, hepatic encephalopathy, diastolic CHF, and IDDM-2, CKD-3A, HTN, HLD, anemia, GERD, anxiety, depression, bipolar disorder, debility, chronic indwelling Foley and sacral decubitus brought to ED with confusion, poor p.o. intake and vomiting, and admitted for acute hepatic encephalopathy, AKI, dehydration, possible portal hypertensive colopathy versus colitis and possible CAUTI.  Started on IV fluid, lactulose, ceftriaxone and Flagyl.  Urine culture with pansensitive E. coli.  De-escalate antibiotic to Ancef and Flagyl.  Encephalopathy improved.  Ammonia down to 30.  Therapy recommended SNF.  TOC working on this.  Subjective: Seen and examined earlier this morning.  No major events overnight of this morning.  She reports feeling better but complains about diarrhea.  She denies chest pain, dyspnea, nausea, vomiting or abdominal pain.  Objective: Vitals:   12/06/20 1733 12/06/20 1945 12/07/20 0325 12/07/20 1304  BP: 140/88 (!) 109/52 134/62 (!) 114/55  Pulse: 82 74 62 61  Resp:  19 19 16   Temp:  98.3 F (36.8 C) 98.1 F (36.7 C) 98.8 F (37.1 C)  TempSrc:    Oral  SpO2:  97% 96% 97%  Weight:      Height:        Intake/Output Summary (Last 24 hours) at 12/07/2020 1308 Last data filed at 12/07/2020 0640 Gross per 24 hour  Intake 804.63 ml  Output 350 ml  Net 454.63 ml   Filed Weights   12/04/20 2255 12/05/20 0500 12/06/20 0444  Weight: 73.4 kg 73.4 kg 79.7 kg    Examination:  GENERAL: No apparent distress.  Nontoxic. HEENT: MMM.  Vision and hearing grossly intact.  NECK: Supple.  No apparent JVD.  RESP: On RA.  No IWOB.  Fair aeration bilaterally. CVS:  RRR.  Heart sounds normal.  ABD/GI/GU: BS+. Abd soft, NTND.  MSK/EXT:  Moves extremities.  BLE weakness.  No edema. SKIN: Stage II pressure skin injury over bilateral buttocks NEURO: Awake, alert and oriented x4 except months.  No apparent focal neuro deficit but bilateral extremity weakness. PSYCH: Calm. Normal affect.    Procedures:  4/3-indwelling Foley catheter change  Microbiology summarized: [id number] and influenza PCR nonreactive. Urine culture with pansensitive E. coli Blood culture NGTD  Assessment & Plan: Acute hepatic and metabolic encephalopathy: In the setting of hepatic encephalopathy and E. coli UTI.  No focal neuro deficit other than generalized weakness.  CT head without acute finding.  Ammonia peaked at 95 and decreased to 30. Encephalopathy seems to have resolved.  Oriented x4 except month.  -Decrease lactulose to 30 g 3 times daily -Continue treatment for UTI and possible colitis -Reorientation and delirium precautions  Decompensated NASH cirrhosis with hepatic encephalopathy: Appears euvolemic. -Lactulose as above -Continue holding Lasix and Aldactone in the setting of AKI and dehydration  CAUTI: this is difficult diagnosis and have to go by urinalysis and urine culture.  Heart LLQ tenderness on admission.  Urine culture with pansensitive E. Coli. Changed indwelling Foley catheter on 4/3 -IV ceftriaxone 4/2-4/5>> IV Ancef 4/5>>.  Possible colitis: CT findings suspicious for portal hypertensive colopathy versus focal right colitis.  Initially, had LLQ tenderness is resolved.  -Continue Flagy to complete 5 days course -Ceftriaxone and Ancef as above  AKI/azotemia: Initially thought to have CKD-3A.  AKI resolved. Recent Labs    10/27/20 0217 10/28/20 0358 10/29/20 0310 10/30/20 0132 10/31/20 0352 11/02/20 0127 12/04/20 1851 12/05/20 0816 12/06/20 0033 12/07/20 0342  BUN 13 15 12 13 13 16  34* 32* 25* 23  CREATININE 0.83 0.93 0.94 0.93 0.98 1.29* 1.36* 1.18*  1.18* 0.97  -Continue holding diuretics  Hypercalcemia: corrects to 10.8.  Ionized calcium elevated at 6.8.  Likely due to dehydration.  Improving.  Hyponatremia: Likely due to hypovolemia.  Resolved.  Chronic diastolic CHF: TTE in 03/5642 with LVEF of 50 to 55%, G2 DD, severe LAE, normal RVSP.  I and O incomplete.  Appears euvolemic. -Continue holding Lasix and spironolactone -Monitor fluid and respiratory status.  Controlled NIDDM-2 with hyperlipidemia: A1c 5.4% on 10/23/2020.  No longer on meds. Recent Labs  Lab 12/04/20 2234 12/05/20 0728 12/05/20 1125 12/05/20 1557 12/05/20 2044  GLUCAP 76 89 101* 116* 93  -Discontinued CBG monitoring and sliding scale  Hemorrhagic renal cyst/microscopic hematuria: multiple bilateral renal cysts with a hemorrhagic cyst on the right noted on CT abdomen and pelvis.  UA with evidence of microscopic hematuria (21-50 RBCs per high-power field).  Mild drop in Hgb likely dilutional from IV fluids and holding diuretics. -SCD for VTE prophylaxis -Outpatient follow-up with urology  Essential hypertension: On hydralazine and diuretics at home. -Changed hydralazine to Coreg given history of liver cirrhosis -Continue holding home diuretics.  Anxiety, depression and bipolar disorder: Stable -Continue home Abilify, Prozac and Wellbutrin.  Pancytopenia: Likely due to liver cirrhosis and dilution from IV fluids -Continue monitoring  Debility: Lately bedbound.  Maximum assist for transfer from bed to wheelchair.  Overweight Body mass index is 29.24 kg/m. Nutrition Problem: Increased nutrient needs Etiology: wound healing Signs/Symptoms: estimated needs Interventions: Ensure Enlive (each supplement provides 350kcal and 20 grams of protein),Juven,MVI   Pressure skin injury: POA -Appreciate input by WOCN.    Pressure Injury 12/04/20 Buttocks Right;Left Stage 2 -  Partial thickness loss of dermis presenting as a shallow open injury with a red, pink  wound bed without slough. (Active)  12/04/20 2230  Location: Buttocks  Location Orientation: Right;Left  Staging: Stage 2 -  Partial thickness loss of dermis presenting as a shallow open injury with a red, pink wound bed without slough.  Wound Description (Comments):   Present on Admission: Yes       DVT prophylaxis:  SCDs Start: 12/04/20 2114  Code Status: Full code Family Communication: Updated patient's son over the phone. Level of care: Med-Surg Status is: Inpatient  Remains inpatient appropriate because:Unsafe d/c plan   Dispo: The patient is from: ILF              Anticipated d/c is to: SNF              Patient currently is medically stable to d/c.   Difficult to place patient No       Consultants:  None   Sch Meds:  Scheduled Meds: . ARIPiprazole  10 mg Oral QHS  . buPROPion  300 mg Oral q morning  . carvedilol  6.25 mg Oral BID WC  . Chlorhexidine Gluconate Cloth  6 each Topical Daily  . ezetimibe  10 mg Oral Daily  . feeding supplement  237 mL Oral BID BM  . FLUoxetine  40 mg Oral q morning  . lactulose  30 g Oral QID  . liver oil-zinc oxide   Topical BID  . multivitamin with minerals  1 tablet Oral Daily  . nutrition supplement (JUVEN)  1 packet Oral BID BM   Continuous Infusions: . [START ON 12/08/2020]  ceFAZolin (ANCEF) IV    . metronidazole 500 mg (12/07/20 0557)   PRN Meds:.  Antimicrobials: Anti-infectives (From admission, onward)   Start     Dose/Rate Route Frequency Ordered Stop   12/08/20 0600  ceFAZolin (ANCEF) IVPB 2g/100 mL premix        2 g 200 mL/hr over 30 Minutes Intravenous Every 8 hours 12/07/20 1128     12/05/20 1000  cefTRIAXone (ROCEPHIN) 2 g in sodium chloride 0.9 % 100 mL IVPB  Status:  Discontinued        2 g 200 mL/hr over 30 Minutes Intravenous Every 24 hours 12/05/20 0845 12/07/20 1126   12/04/20 2200  metroNIDAZOLE (FLAGYL) IVPB 500 mg        500 mg 100 mL/hr over 60 Minutes Intravenous Every 8 hours 12/04/20 2153      12/04/20 2115  cefTRIAXone (ROCEPHIN) 1 g in sodium chloride 0.9 % 100 mL IVPB  Status:  Discontinued        1 g 200 mL/hr over 30 Minutes Intravenous Every 24 hours 12/04/20 2115 12/05/20 0845       I have personally reviewed the following labs and images: CBC: Recent Labs  Lab 12/04/20 1851 12/05/20 0816 12/06/20 0033 12/07/20 0342  WBC 5.1 4.4 3.7* 3.9*  NEUTROABS 3.2  --   --  2.2  HGB 12.4 13.0 11.6* 11.1*  HCT 37.2 38.5 34.9* 34.5*  MCV 93.0 92.5 94.8 94.8  PLT 152 131* 120* 116*   BMP &GFR Recent Labs  Lab 12/04/20 1851 12/05/20 0816 12/06/20 0033 12/07/20 0342  NA 133* 135 138 140  K 4.1 4.4 4.5 4.1  CL 104 108 111 113*  CO2 22 20* 19* 22  GLUCOSE 87 87 87 70  BUN 34* 32* 25* 23  CREATININE 1.36* 1.18* 1.18* 0.97  CALCIUM 10.4* 10.2 9.8 9.5  MG  --  2.1 1.8 1.8  PHOS  --  2.9 2.7 1.9*   Estimated Creatinine Clearance: 51.5 mL/min (by C-G formula based on SCr of 0.97 mg/dL). Liver & Pancreas: Recent Labs  Lab 12/04/20 1851 12/05/20 0816 12/06/20 0033 12/07/20 0342  AST 48*  --   --  48*  ALT 31  --   --  25  ALKPHOS 118  --   --  102  BILITOT 1.4*  --   --  0.8  PROT 6.7  --   --  5.5*  ALBUMIN 2.9* 2.7* 2.4* 2.3*   No results for input(s): LIPASE, AMYLASE in the last 168 hours. Recent Labs  Lab 12/04/20 1851 12/05/20 0041 12/05/20 0816 12/06/20 0927 12/07/20 0427  AMMONIA 60* 86* 93* 55* 30   Diabetic: No results for input(s): HGBA1C in the last 72 hours. Recent Labs  Lab 12/04/20 2234 12/05/20 0728 12/05/20 1125 12/05/20 1557 12/05/20 2044  GLUCAP 76 89 101* 116* 93   Cardiac Enzymes: No results for input(s): CKTOTAL, CKMB, CKMBINDEX, TROPONINI in the last 168 hours. No results for input(s): PROBNP in the last 8760 hours. Coagulation Profile: No results for input(s): INR, PROTIME in the last 168 hours. Thyroid Function Tests: Recent Labs    12/07/20 0342  TSH 2.508   Lipid Profile: No results for input(s): CHOL,  HDL, LDLCALC, TRIG, CHOLHDL, LDLDIRECT in the last 72 hours. Anemia Panel: Recent Labs    12/07/20 0342  VITAMINB12 259  FOLATE 9.3  FERRITIN 252  TIBC 200*  IRON 74  RETICCTPCT 2.2   Urine analysis:    Component Value Date/Time   COLORURINE YELLOW 12/04/2020 [place] 12/04/2020 1851   LABSPEC 1.011 12/04/2020 1851   PHURINE 5.0 12/04/2020 1851   GLUCOSEU NEGATIVE 12/04/2020 1851   HGBUR MODERATE (A) 12/04/2020 1851   BILIRUBINUR NEGATIVE 12/04/2020 1851   [place] 12/04/2020 1851   PROTEINUR NEGATIVE 12/04/2020 1851   NITRITE POSITIVE (A) 12/04/2020 1851   LEUKOCYTESUR LARGE (A) 12/04/2020 1851   Sepsis Labs: Invalid input(s): PROCALCITONIN, [place]  Microbiology: Recent Results (from the past 240 hour(s))  Urine culture     Status: Abnormal   Collection Time: 12/04/20  6:51 PM   Specimen: Urine, Clean Catch  Result Value Ref Range Status   Specimen Description   Final    URINE, CLEAN CATCH Performed at [facility], [place] [address] [place], [place] [address]    Special Requests   Final    NONE Performed at [facility], [place] [address], [place], [place] [address]    Culture >=100,000 COLONIES/mL ESCHERICHIA COLI (A)  Final   Report Status 12/07/2020 FINAL  Final   Organism ID, Bacteria ESCHERICHIA COLI (A)  Final      Susceptibility   Escherichia coli - MIC*    AMPICILLIN <=2 SENSITIVE Sensitive     CEFAZOLIN <=4 SENSITIVE Sensitive     CEFEPIME <=0.12 SENSITIVE Sensitive     CEFTRIAXONE <=0.25 SENSITIVE Sensitive     CIPROFLOXACIN <=0.25 SENSITIVE Sensitive     GENTAMICIN <=1 SENSITIVE Sensitive     IMIPENEM <=0.25 SENSITIVE Sensitive     NITROFURANTOIN <=16 SENSITIVE Sensitive     TRIMETH/SULFA <=20 SENSITIVE Sensitive     AMPICILLIN/SULBACTAM <=2 SENSITIVE Sensitive     PIP/TAZO <=4 SENSITIVE Sensitive     * >=100,000 COLONIES/mL ESCHERICHIA COLI  SARS CORONAVIRUS 2 (TAT 6-24 HRS)  Nasopharyngeal Nasopharyngeal Swab     Status: None   Collection Time: 12/04/20  9:29 PM   Specimen: Nasopharyngeal Swab  Result Value Ref Range Status   SARS Coronavirus 2 NEGATIVE NEGATIVE Final    Comment: (NOTE) SARS-CoV-2 target nucleic acids are NOT DETECTED.  The SARS-CoV-2 RNA is generally detectable in upper and lower respiratory specimens during the acute phase of infection. Negative results do not preclude SARS-CoV-2 infection, do not rule out co-infections with other pathogens, and should not be used as the sole basis for treatment or other patient management decisions. Negative results must be combined with clinical observations, patient history, and epidemiological information. The expected result is Negative.  Fact Sheet for Patients: SugarRoll.be  Fact Sheet for Healthcare Providers: https://www.woods-mathews.com/  This test is not yet approved or cleared by the [place] FDA and  has been authorized for detection and/or diagnosis of SARS-CoV-2 by FDA under an Emergency Use Authorization (EUA). This EUA will remain  in effect (meaning this test can be used) for the duration of the COVID-19 declaration under Se ction 564(b)(1) of the Act, 21 U.S.C. section 360bbb-3(b)(1), unless the authorization is terminated or revoked sooner.  Performed at [facility], [place] [address], [place], [place] [address]   Culture, blood (routine x 2)     Status: None (Preliminary result)   Collection Time: 12/05/20 12:41 AM   Specimen: BLOOD RIGHT ARM  Result Value Ref Range Status   Specimen Description   Final    BLOOD RIGHT ARM Performed at [place] [address] [place], [place] [address]  Special Requests   Final    BOTTLES DRAWN AEROBIC ONLY Blood Culture adequate volume Performed at [place] [address] [place], [place] [address]    Culture   Final    NO GROWTH 2  DAYS Performed at [place] [address] [place], [place] [address]    Report Status PENDING  Incomplete  Culture, blood (routine x 2)     Status: None (Preliminary result)   Collection Time: 12/05/20 12:41 AM   Specimen: BLOOD  Result Value Ref Range Status   Specimen Description   Final    BLOOD BLOOD RIGHT HAND Performed at [place] [address], [place], [place] [address]    Special Requests   Final    BOTTLES DRAWN AEROBIC ONLY Blood Culture adequate volume Performed at [facility] [address], [place], [place] [address]    Culture   Final    NO GROWTH 2 DAYS Performed at [place] [address], [place], [place] [address]    Report Status PENDING  Incomplete  MRSA PCR Screening     Status: None   Collection Time: 12/05/20  2:58 AM   Specimen: Nasopharyngeal  Result Value Ref Range Status   MRSA by PCR NEGATIVE NEGATIVE Final    Comment:        The GeneXpert MRSA Assay (FDA approved for NASAL specimens only), is one component of a comprehensive MRSA colonization surveillance program. It is not intended to diagnose MRSA infection nor to guide or monitor treatment for MRSA infections. Performed at [facility], [place] [address], [place], [place] [address]     Radiology Studies: No results found.   Pearline Yerby T. [place]  If 7PM-7AM, please contact night-coverage [email or web address] 12/07/2020, 1:08 PM

## 2020-12-07 NOTE — Care Management Important Message (Signed)
Important Message  Patient Details  IM Letter given to the Patient. Name: Sheila [name] MRN: [id number] Date of Birth: 1945/10/18   Medicare Important Message Given:  Yes     Kerin Salen 12/07/2020, 10:29 AM

## 2020-12-07 NOTE — TOC Progression Note (Addendum)
Transition of Care [facility]) - Progression Note    Patient Details  Name: Sheila [name] MRN: [id number] Date of Birth: March 01, 1946  Transition of Care [facility]) CM/SW [place], [place] Phone Number: 12/07/2020, 11:06 AM  Clinical Narrative:   Spoke to son about bed offers.  He selected [facility]  I have reached out to [facility] to find out when they can take her.  Awaiting response. TOC will continue to follow during the course of hospitalization.  AddendumKennon Holter MD asking for COVID test as Sheila [name] responded and I let her know patient will be stable for d/c tomorrow.     Expected Discharge Plan: Skilled Nursing Facility Barriers to Discharge: SNF Pending bed offer  Expected Discharge Plan and Services Expected Discharge Plan: [place] Choice: [place] arrangements for the past 2 months: Assisted Living Facility                                       Social Determinants of Health (SDOH) Interventions    Readmission Risk Interventions Readmission Risk Prevention Plan 05/15/2019  Post Dischage Appt Complete  Medication Screening Complete  Transportation Screening Complete

## 2020-12-08 DIAGNOSIS — N179 Acute kidney failure, unspecified: Secondary | ICD-10-CM

## 2020-12-08 LAB — COMPREHENSIVE METABOLIC PANEL
ALT: 24 U/L (ref 0–44)
AST: 42 U/L — ABNORMAL HIGH (ref 15–41)
Albumin: 2.3 g/dL — ABNORMAL LOW (ref 3.5–5.0)
Alkaline Phosphatase: 123 U/L (ref 38–126)
Anion gap: 5 (ref 5–15)
BUN: 20 mg/dL (ref 8–23)
CO2: 19 mmol/L — ABNORMAL LOW (ref 22–32)
Calcium: 9.3 mg/dL (ref 8.9–10.3)
Chloride: 114 mmol/L — ABNORMAL HIGH (ref 98–111)
Creatinine, Ser: 0.9 mg/dL (ref 0.44–1.00)
GFR, Estimated: >60 mL/min (ref >60)
Glucose, Bld: 77 mg/dL (ref 70–99)
Potassium: 3.9 mmol/L (ref 3.5–5.1)
Sodium: 138 mmol/L (ref 135–145)
Total Bilirubin: 0.7 mg/dL (ref 0.3–1.2)
Total Protein: 5.6 g/dL — ABNORMAL LOW (ref 6.5–8.1)

## 2020-12-08 LAB — CBC
HCT: 34.4 % — ABNORMAL LOW (ref 36.0–46.0)
Hemoglobin: 11.5 g/dL — ABNORMAL LOW (ref 12.0–15.0)
MCH: 31.9 pg (ref 26.0–34.0)
MCHC: 33.4 g/dL (ref 30.0–36.0)
MCV: 95.3 fL (ref 80.0–100.0)
Platelets: 115 10*3/uL — ABNORMAL LOW (ref 150–400)
RBC: 3.61 MIL/uL — ABNORMAL LOW (ref 3.87–5.11)
RDW: 22.5 % — ABNORMAL HIGH (ref 11.5–15.5)
WBC: 4.8 10*3/uL (ref 4.0–10.5)
nRBC: 0 % (ref 0.0–0.2)

## 2020-12-08 LAB — AMMONIA: Ammonia: 30 umol/L (ref 9–35)

## 2020-12-08 LAB — PHOSPHORUS: Phosphorus: 2.7 mg/dL (ref 2.5–4.6)

## 2020-12-08 LAB — MAGNESIUM: Magnesium: 1.8 mg/dL (ref 1.7–2.4)

## 2020-12-08 MED ORDER — VITAMIN B-12 1000 MCG PO TABS
1000.0000 ug | ORAL_TABLET | Freq: Every day | ORAL | Status: DC
  Administered 2020-12-08 – 2020-12-09 (×2): 1000 ug via ORAL
  Filled 2020-12-08 (×2): qty 1

## 2020-12-08 NOTE — Progress Notes (Addendum)
PROGRESS NOTE    Sheila [name]  ZOX:[id number] DOB: September 30, 1945 DOA: 12/04/2020 PCP: Chesley Noon, MD   Chief Complaint  Patient presents with  . Altered Mental Status   Brief Narrative:  75 year old F with PMH of NASH cirrhosis, hepatic encephalopathy, diastolic CHF, and IDDM-2, CKD-3A, HTN, HLD, anemia, GERD, anxiety, depression, bipolar disorder, debility, chronic indwelling Foley and sacral decubitus brought to ED with confusion, poor p.o. intake and vomiting, and admitted for acute hepatic encephalopathy, AKI, dehydration, possible portal hypertensive colopathy versus colitis and possible CAUTI.  She's improved on IV fluid, lactulose, ceftriaxone and Flagyl.  Currently working on SNF placement.  See below for additional details.  Assessment & Plan:   Principal Problem:   Acute metabolic encephalopathy Active Problems:   AKI (acute kidney injury) ([place])   Acute hepatic encephalopathy   Pressure injury of skin   UTI (urinary tract infection)   Colitis  Acute hepaticandmetabolic encephalopathy: In the setting of hepatic encephalopathy and E. coli UTI.  No focal neuro deficit other than generalized weakness.  CT head without acute finding.  Ammonia peaked at 95 and decreased to 30. Encephalopathy seems to have resolved.  Oriented x2 today (thought it was May).   -Continue lactulose with goal of 2-3 BM daily -Continue treatment for UTI and possible colitis -Reorientation and delirium precautions  Decompensated NASH cirrhosis with hepatic encephalopathy: Appears euvolemic. -Lactulose as above -Continue holding Lasix and Aldactone in the setting of AKI and dehydration  CAUTI: this is difficult diagnosis and have to go by urinalysis and urine culture.  Heart LLQ tenderness on admission.  Urine culture with pansensitive E. Coli. Changed indwelling Foley catheter on 4/3 -IV ceftriaxone 4/2-4/5>> IV Ancef 4/5>>. -plan for 7 days abx  Possible colitis: CT findings  suspicious for portal hypertensive colopathy versus focal right colitis.  Initially, had LLQ tenderness is resolved.  -Continue Flagy to complete 5 days course -Ceftriaxone and Ancef as above  AKI/azotemia: Initially thought to have CKD-3A.  AKI resolved.  Hypercalcemia: corrects to 10.7.  Gradually improving.   Hyponatremia: Likely due to hypovolemia.  Resolved.  Chronic diastolic CHF: TTE in 04/1190 with LVEF of 50 to 55%, G2 DD, severe LAE, normal RVSP.  I and O incomplete.  Appears euvolemic. -Continue holding Lasix and spironolactone -Monitor fluid and respiratory status.  Controlled NIDDM-2 with hyperlipidemia: A1c 5.4% on 10/23/2020.  No longer on meds.  Hemorrhagic renal cyst/microscopic hematuria: multiple bilateral renal cysts with W[place] Penning hemorrhagic cyst on the right noted on CT abdomen and pelvis. UA with evidence of microscopic hematuria (21-50 RBCs per high-power field).  Mild drop in Hgb likely dilutional from IV fluids and holding diuretics. -SCD for VTE prophylaxis -Outpatient follow-up with urology  Essential hypertension: On hydralazine and diuretics at home. -Changed hydralazine to Coreg given history of liver cirrhosis -Continue holding home diuretics.  Anxiety, depression and bipolar disorder: Stable -Continue home Abilify, Prozac and Wellbutrin.  Pancytopenia: Likely due to liver cirrhosis and dilution from IV fluids -Continue monitoring  Debility: Lately bedbound.  Maximum assist for transfer from bed to wheelchair.  Overweight Body mass index is 29.24 kg/m. Nutrition Problem: Increased nutrient needs Etiology: wound healing Signs/Symptoms: estimated needs Interventions: Ensure Enlive (each supplement provides 350kcal and 20 grams of protein),Juven,MVI   Pressure skin injury: POA -Appreciate input by WOCN.  Goals of Care Appreciate palliative care assistance, plan for d/c to SNF with palliative to follow up   DVT prophylaxis: SCD Code Status:  full  Family Communication: none at bedside -  discussed with son Disposition:   Status is: Inpatient  Remains inpatient appropriate because:Inpatient level of care appropriate due to severity of illness   Dispo: The patient is from: Home              Anticipated d/c is to: SNF              Patient currently is medically stable to d/c.   Difficult to place patient No   Consultants:   Palliative care  Procedures:    Antimicrobials:  Anti-infectives (From admission, onward)   Start     Dose/Rate Route Frequency Ordered Stop   12/08/20 0600  ceFAZolin (ANCEF) IVPB 2g/100 mL premix        2 g 200 mL/hr over 30 Minutes Intravenous Every 8 hours 12/07/20 1128     12/05/20 1000  cefTRIAXone (ROCEPHIN) 2 g in sodium chloride 0.9 % 100 mL IVPB  Status:  Discontinued        2 g 200 mL/hr over 30 Minutes Intravenous Every 24 hours 12/05/20 0845 12/07/20 1126   12/04/20 2200  metroNIDAZOLE (FLAGYL) IVPB 500 mg        500 mg 100 mL/hr over 60 Minutes Intravenous Every 8 hours 12/04/20 2153     12/04/20 2115  cefTRIAXone (ROCEPHIN) 1 g in sodium chloride 0.9 % 100 mL IVPB  Status:  Discontinued        1 g 200 mL/hr over 30 Minutes Intravenous Every 24 hours 12/04/20 2115 12/05/20 0845         Subjective: Neilani Duffee&Ox2 Feeling better overall  Objective: Vitals:   12/07/20 1304 12/07/20 2111 12/08/20 0418 12/08/20 1320  BP: (!) 114/55 140/68 110/69 (!) 101/59  Pulse: 61 65 62 63  Resp: 16 17 17 16   Temp: 98.8 F (37.1 C) 98.2 F (36.8 C) 98 F (36.7 C) 98.2 F (36.8 C)  TempSrc: Oral   Oral  SpO2: 97% 97% 91% 98%  Weight:      Height:        Intake/Output Summary (Last 24 hours) at 12/08/2020 1640 Last data filed at 12/08/2020 0900 Gross per 24 hour  Intake 540 ml  Output 900 ml  Net -360 ml   Filed Weights   12/04/20 2255 12/05/20 0500 12/06/20 0444  Weight: 73.4 kg 73.4 kg 79.7 kg    Examination:  General exam: Appears calm and comfortable  Respiratory system: Clear  to auscultation. Respiratory effort normal. Cardiovascular system: S1 & S2 heard, RRR. Gastrointestinal system: Abdomen is nondistended, soft and nontender. Central nervous system: Alert and oriented x2. No focal neurological deficits. Extremities: no LEE Skin: No rashes, lesions or ulcers Psychiatry: Judgement and insight appear normal. Mood & affect appropriate.     Data Reviewed: I have personally reviewed following labs and imaging studies  CBC: Recent Labs  Lab 12/04/20 1851 12/05/20 0816 12/06/20 0033 12/07/20 0342 12/08/20 0035  WBC 5.1 4.4 3.7* 3.9* 4.8  NEUTROABS 3.2  --   --  2.2  --   HGB 12.4 13.0 11.6* 11.1* 11.5*  HCT 37.2 38.5 34.9* 34.5* 34.4*  MCV 93.0 92.5 94.8 94.8 95.3  PLT 152 131* 120* 116* 115*    Basic Metabolic Panel: Recent Labs  Lab 12/04/20 1851 12/05/20 0816 12/06/20 0033 12/07/20 0342 12/08/20 0035  NA 133* 135 138 140 138  K 4.1 4.4 4.5 4.1 3.9  CL 104 108 111 113* 114*  CO2 22 20* 19* 22 19*  GLUCOSE 87 87 87 70 77  BUN  34* 32* 25* 23 20  CREATININE 1.36* 1.18* 1.18* 0.97 0.90  CALCIUM 10.4* 10.2 9.8 9.5 9.3  MG  --  2.1 1.8 1.8 1.8  PHOS  --  2.9 2.7 1.9* 2.7    GFR: Estimated Creatinine Clearance: 55.5 mL/min (by C-G formula based on SCr of 0.9 mg/dL).  Liver Function Tests: Recent Labs  Lab 12/04/20 1851 12/05/20 0816 12/06/20 0033 12/07/20 0342 12/08/20 0035  AST 48*  --   --  48* 42*  ALT 31  --   --  25 24  ALKPHOS 118  --   --  102 123  BILITOT 1.4*  --   --  0.8 0.7  PROT 6.7  --   --  5.5* 5.6*  ALBUMIN 2.9* 2.7* 2.4* 2.3* 2.3*    CBG: Recent Labs  Lab 12/04/20 2234 12/05/20 0728 12/05/20 1125 12/05/20 1557 12/05/20 2044  GLUCAP 76 89 101* 116* 93     Recent Results (from the past 240 hour(s))  Urine culture     Status: Abnormal   Collection Time: 12/04/20  6:51 PM   Specimen: Urine, Clean Catch  Result Value Ref Range Status   Specimen Description   Final    URINE, CLEAN CATCH Performed at  [facility], [place] [address], [place], [place] [address]    Special Requests   Final    NONE Performed at [facility], [place] [address], [place], [place] [address]    Culture >=100,000 COLONIES/mL ESCHERICHIA COLI (Kaymon Denomme)  Final   Report Status 12/07/2020 FINAL  Final   Organism ID, Bacteria ESCHERICHIA COLI (Itati Brocksmith)  Final      Susceptibility   Escherichia coli - MIC*    AMPICILLIN <=2 SENSITIVE Sensitive     CEFAZOLIN <=4 SENSITIVE Sensitive     CEFEPIME <=0.12 SENSITIVE Sensitive     CEFTRIAXONE <=0.25 SENSITIVE Sensitive     CIPROFLOXACIN <=0.25 SENSITIVE Sensitive     GENTAMICIN <=1 SENSITIVE Sensitive     IMIPENEM <=0.25 SENSITIVE Sensitive     NITROFURANTOIN <=16 SENSITIVE Sensitive     TRIMETH/SULFA <=20 SENSITIVE Sensitive     AMPICILLIN/SULBACTAM <=2 SENSITIVE Sensitive     PIP/TAZO <=4 SENSITIVE Sensitive     * >=100,000 COLONIES/mL ESCHERICHIA COLI  SARS CORONAVIRUS 2 (TAT 6-24 HRS) Nasopharyngeal Nasopharyngeal Swab     Status: None   Collection Time: 12/04/20  9:29 PM   Specimen: Nasopharyngeal Swab  Result Value Ref Range Status   SARS Coronavirus 2 NEGATIVE NEGATIVE Final    Comment: (NOTE) SARS-CoV-2 target nucleic acids are NOT DETECTED.  The SARS-CoV-2 RNA is generally detectable in upper and lower respiratory specimens during the acute phase of infection. Negative results do not preclude SARS-CoV-2 infection, do not rule out co-infections with other pathogens, and should not be used as the sole basis for treatment or other patient management decisions. Negative results must be combined with clinical observations, patient history, and epidemiological information. The expected result is Negative.  Fact Sheet for Patients: SugarRoll.be  Fact Sheet for Healthcare Providers: https://www.woods-mathews.com/  This test is not yet approved or cleared by the [place] FDA and  has been  authorized for detection and/or diagnosis of SARS-CoV-2 by FDA under an Emergency Use Authorization (EUA). This EUA will remain  in effect (meaning this test can be used) for the duration of the COVID-19 declaration under Se ction 564(b)(1) of the Act, 21 U.S.C. section 360bbb-3(b)(1), unless the authorization is terminated or revoked sooner.  Performed  at [facility], [place] [address], [place], [place] [address]   Culture, blood (routine x 2)     Status: None (Preliminary result)   Collection Time: 12/05/20 12:41 AM   Specimen: BLOOD RIGHT ARM  Result Value Ref Range Status   Specimen Description   Final    BLOOD RIGHT ARM Performed at [facility] [address] [place], [place] [address]    Special Requests   Final    BOTTLES DRAWN AEROBIC ONLY Blood Culture adequate volume Performed at [facility] [address] [place], [place] [address]    Culture   Final    NO GROWTH 3 DAYS Performed at [facility], [place] [address] [place], [place] [address]    Report Status PENDING  Incomplete  Culture, blood (routine x 2)     Status: None (Preliminary result)   Collection Time: 12/05/20 12:41 AM   Specimen: BLOOD  Result Value Ref Range Status   Specimen Description   Final    BLOOD BLOOD RIGHT HAND Performed at [facility] [address], [place], [place] [address]    Special Requests   Final    BOTTLES DRAWN AEROBIC ONLY Blood Culture adequate volume Performed at [facility] [address] [place], [place] [address]    Culture   Final    NO GROWTH 3 DAYS Performed at [facility], [place] [address] [place], [place] [address]    Report Status PENDING  Incomplete  MRSA PCR Screening     Status: None   Collection Time: 12/05/20  2:58 AM   Specimen: Nasopharyngeal  Result Value Ref Range Status   MRSA by PCR NEGATIVE NEGATIVE Final    Comment:        The GeneXpert MRSA Assay  (FDA approved for NASAL specimens only), is one component of Rutledge Selsor comprehensive MRSA colonization surveillance program. It is not intended to diagnose MRSA infection nor to guide or monitor treatment for MRSA infections. Performed at [facility], [place] [address], [place], [place] [address]   SARS CORONAVIRUS 2 (TAT 6-24 HRS) Nasopharyngeal Nasopharyngeal Swab     Status: None   Collection Time: 12/07/20  2:02 PM   Specimen: Nasopharyngeal Swab  Result Value Ref Range Status   SARS Coronavirus 2 NEGATIVE NEGATIVE Final    Comment: (NOTE) SARS-CoV-2 target nucleic acids are NOT DETECTED.  The SARS-CoV-2 RNA is generally detectable in upper and lower respiratory specimens during the acute phase of infection. Negative results do not preclude SARS-CoV-2 infection, do not rule out co-infections with other pathogens, and should not be used as the sole basis for treatment or other patient management decisions. Negative results must be combined with clinical observations, patient history, and epidemiological information. The expected result is Negative.  Fact Sheet for Patients: SugarRoll.be  Fact Sheet for Healthcare Providers: https://www.woods-mathews.com/  This test is not yet approved or cleared by the [place] FDA and  has been authorized for detection and/or diagnosis of SARS-CoV-2 by FDA under an Emergency Use Authorization (EUA). This EUA will remain  in effect (meaning this test can be used) for the duration of the COVID-19 declaration under Se ction 564(b)(1) of the Act, 21 U.S.C. section 360bbb-3(b)(1), unless the authorization is terminated or revoked sooner.  Performed at [facility], [place] [address] [place], [place] [address]          Radiology Studies: No results found.  Scheduled Meds: . ARIPiprazole  10 mg Oral QHS  . buPROPion  300 mg Oral q morning  . carvedilol  6.25 mg  Oral BID WC  . Chlorhexidine Gluconate Cloth  6 each Topical Daily  . ezetimibe  10 mg Oral Daily  . feeding supplement  237 mL Oral BID BM  . FLUoxetine  40 mg Oral q morning  . lactulose  30 g Oral TID  . liver oil-zinc oxide   Topical BID  . multivitamin with minerals  1 tablet Oral Daily  . nutrition supplement (JUVEN)  1 packet Oral BID BM  . vitamin B-12  1,000 mcg Oral Daily   Continuous Infusions: .  ceFAZolin (ANCEF) IV 2 g (12/08/20 1335)  . metronidazole 500 mg (12/08/20 1438)     LOS: 4 days    Time spent: over 30 min    Fayrene Helper, MD [facility]   To contact the attending provider between 7A-7P or the covering provider during after hours 7P-7A, please log into the web site [email or web address] and access using universal [place] password for that web site. If you do not have the password, please call the hospital operator.  12/08/2020, 4:40 PM

## 2020-12-08 NOTE — Progress Notes (Signed)
Manufacturing engineer [facility])  Received request from [facility] for hospice services at home after discharge.  Chart and pt information under review by [facility] physician.  Hospice eligibility pending at this time.  Hospital liaison spoke with pt's son Elta Guadeloupe to initiate education related to hospice philosophy and services and to answer any questions at this time.  Elta Guadeloupe verbalized understanding of information given.  Per discussion the plan is to discharge home tomorrow via [facility].    Please provide prescriptions at discharge as needed to ensure ongoing symptom management until pt can be admitted onto hospice services.    DME needs discussed.  Family denies any DME needs at this time.  Address has been verified and is correct in the chart.  ACC information and contact numbers given to [facility].  Above information shared with [facility] [employer] Manager.  Please call with any questions or concerns.  Thank you for the opportunity to participate in this pt's care.  Domenic Moras, BSN, RN [facility] [phone] (24h on call)

## 2020-12-08 NOTE — Consult Note (Signed)
Consultation Note Date: 12/08/2020   Patient Name: Sheila [name]  DOB: 21-Jan-1946  MRN: [id number]  Age / Sex: 75 y.o., female  PCP: Chesley Noon, MD Referring Physician: Elodia Florence., *  Reason for Consultation: Establishing goals of care  HPI/Patient Profile: 75 y.o. female  with past medical history of [name] process, hepatic cephalopathy, diastolic congestive heart failure, IDDM, CKD, hypertension, hyperlipidemia, anemia, GERD, anxiety, depression, bipolar disorder, debility, chronic indwelling Foley catheter, and sacral decubitus ulcer admitted on 12/04/2020 with confusion and poor p.o. intake and vomiting.  She was admitted with acute hepatic encephalopathy, AKI, dehydration, portal hypertensive colopathy versus colitis and possible catheter associated UTI.  She was started on fluid, lactulose, ceftriaxone and Flagyl.  Clinical Assessment and Goals of Care: I met today with patient and her husband.  I introduced palliative care as specialized medical care for people living with serious illness. It focuses on providing relief from the symptoms and stress of a serious illness. The goal is to improve quality of life for both the patient and the family. We discussed clinical course as well as wishes moving forward in regard to advanced directives.  Concepts specific to code status and rehospitalization discussed.  We discussed difference between a aggressive medical intervention path and a palliative, comfort focused care path.  Values and goals of care important to patient and family were attempted to be elicited.  Questions and concerns addressed.   PMT will continue to support holistically.  SUMMARY OF RECOMMENDATIONS   - Full code full scope -I met today and discussed with patient and her husband.  Attempted to call son but was not able to reach him today.  Overall, I am not sure patient or her  husband have full insight into her overall conditions.  They are able to hold appropriate social conversation, however, they did have difficulty processing parts of conversation and also were unable to answer some questions that one would think they will be able to answer (such as being unable to answer how many children they have). -Chart review reveals that she is ready for discharge soon with plan for trial of rehab.  Would therefore recommend continuing conversation regarding goals through outpatient palliative care after they can assess how she is doing in a week or 2 after transitioning to rehab.  Code Status/Advance Care Planning: Full code   Prognosis:   Unable to determine  Discharge Planning: [place] for rehab with Palliative care service follow-up      Primary Diagnoses: Present on Admission: . Acute metabolic encephalopathy . UTI (urinary tract infection) . Acute hepatic encephalopathy . Colitis . AKI (acute kidney injury) ([place]) . Pressure injury of skin   I have reviewed the medical record, interviewed the patient and family, and examined the patient. The following aspects are pertinent.  Past Medical History:  Diagnosis Date  . Anemia   . Anxiety   . Cataract   . Depression   . Diabetes mellitus without complication ([place])   . Diverticulitis  Bled  . Esophageal varices ([place])   . Gallstones   . GERD (gastroesophageal reflux disease)   . Heart murmur   . Hx of migraines   . Hyperlipidemia   . Hypertension   . IC (interstitial cystitis)   . Irritable bowel syndrome   . NASH (nonalcoholic steatohepatitis)   . Obesity   . Pneumonia   . PVC (premature ventricular contraction)   . Vulva cancer [facility])    Social History   Socioeconomic History  . Marital status: Married    Spouse name: Broadus John  . Number of children: 4  . Years of education: Not on file  . Highest education level: Not on file  Occupational History  . Not on file  Tobacco  Use  . Smoking status: Former Smoker    Packs/day: 1.00    Years: 25.00    Pack years: 25.00    Types: Cigarettes    Quit date: 2009    Years since quitting: 13.2  . Smokeless tobacco: Never Used  Vaping Use  . Vaping Use: Never used  Substance and Sexual Activity  . Alcohol use: Never    Comment: rarely  . Drug use: Never  . Sexual activity: Not on file  Other Topics Concern  . Not on file  Social History Narrative  . Not on file   Social Determinants of Health   Financial Resource Strain: Not on file  Food Insecurity: Not on file  Transportation Needs: Not on file  Physical Activity: Not on file  Stress: Not on file  Social Connections: Not on file   Family History  Adopted: Yes   Scheduled Meds: . ARIPiprazole  10 mg Oral QHS  . buPROPion  300 mg Oral q morning  . carvedilol  6.25 mg Oral BID WC  . Chlorhexidine Gluconate Cloth  6 each Topical Daily  . ezetimibe  10 mg Oral Daily  . feeding supplement  237 mL Oral BID BM  . FLUoxetine  40 mg Oral q morning  . lactulose  30 g Oral TID  . liver oil-zinc oxide   Topical BID  . multivitamin with minerals  1 tablet Oral Daily  . nutrition supplement (JUVEN)  1 packet Oral BID BM  . vitamin B-12  1,000 mcg Oral Daily   Continuous Infusions: .  ceFAZolin (ANCEF) IV 2 g (12/08/20 0617)  . metronidazole 500 mg (12/08/20 0508)   PRN Meds:. Medications Prior to Admission:  Prior to Admission medications   Medication Sig Start Date End Date Taking? Authorizing Provider  ARIPiprazole (ABILIFY) 10 MG tablet Take 10 mg by mouth at bedtime.   Yes [provider]  bethanechol (URECHOLINE) 5 MG tablet Take 5 mg by mouth 3 (three) times daily. 11/26/20  Yes [provider]  buPROPion (WELLBUTRIN XL) 300 MG 24 hr tablet Take 300 mg by mouth every morning. 04/15/19  Yes [provider]  Calcium-Vitamin D-Vitamin K (VIACTIV PO) Take 1 tablet by mouth daily.   Yes [provider]  clobetasol  cream (TEMOVATE) 8.02 % Apply 1 application topically daily as needed for rash. 11/01/16  Yes [provider]  diclofenac sodium (VOLTAREN) 1 % GEL Apply 2 g topically 2 (two) times daily as needed for pain. 09/12/17  Yes [provider]  ezetimibe (ZETIA) 10 MG tablet Take 10 mg by mouth daily. 08/05/18  Yes [provider]  FLUoxetine (PROZAC) 40 MG capsule Take 40 mg by mouth every morning.   Yes [provider]  furosemide (LASIX) 20 MG tablet Take 20 mg by mouth every morning. 10/04/20  Yes [provider]  hydrALAZINE (APRESOLINE) 10 MG tablet Take 10 mg by mouth 4 (four) times daily.   Yes [provider]  lactulose (CHRONULAC) 10 GM/15ML solution Take 45 mLs (30 g total) by mouth 4 (four) times daily. Patient taking differently: Take 30 g by mouth 2 (two) times daily. 11/02/20  Yes Cato Mulligan, MD  omeprazole (PRILOSEC) 40 MG capsule Take 40 mg by mouth every morning. 12/22/14  Yes [provider]  ondansetron (ZOFRAN) 4 MG tablet Take 4 mg by mouth every 8 (eight) hours as needed for nausea or vomiting.   Yes [provider]  Polyethyl Glycol-Propyl Glycol (SYSTANE) 0.4-0.3 % SOLN Place 1 drop into both eyes 2 (two) times daily.   Yes [provider]  spironolactone (ALDACTONE) 100 MG tablet Take 1 tablet (100 mg total) by mouth daily. 11/03/20  Yes Cato Mulligan, MD  lactulose, encephalopathy, (CHRONULAC) 10 GM/15ML SOLN TAKE 45 MLS (30 G TOTAL) BY MOUTH FOUR TIMES DAILY. Patient not taking: No sig reported 11/02/20 11/02/21  Cato Mulligan, MD   Allergies  Allergen Reactions  . Acetaminophen Other (See Comments)    Patient avoids Tylenol due to fatty liver disease  . Cefazolin Other (See Comments)    Caused C-DIF   . Ciprofloxacin Hcl Nausea Only  . Ibuprofen Other (See Comments)    Diabetic--Concerns about kidneys  . Sulfa Antibiotics Hives and Itching  . Naproxen Sodium Other (See Comments)    Causes  pain in veins  . Amoxicillin Diarrhea    Did it involve swelling of the face/tongue/throat, SOB, or low BP? No Did it involve sudden or severe rash/hives, skin peeling, or any reaction on the inside of your mouth or nose? No Did you need to seek medical attention at a hospital or doctor's office? No When did it last happen?several years If all above answers are "NO", may proceed with cephalosporin use.    Review of Systems  Endorses diarrhea. Denies all other complaints.  Physical Exam  General: Alert, awake, in no acute distress.  HEENT: No bruits, no goiter, no JVD Heart: Regular rate and rhythm. No murmur appreciated. Lungs: Good air movement, clear Abdomen: Soft, nontender, nondistended, positive bowel sounds.  Ext: No significant edema Skin: Warm and dry Neuro: Grossly intact, nonfocal.  Vital Signs: BP 110/69   Pulse 62   Temp 98 F (36.7 C)   Resp 17   Ht _0  (1.651 m)   Wt 79.7 kg   SpO2 91%   BMI 29.24 kg/m  Pain Scale: 0-10   Pain Score: 0-No pain   SpO2: SpO2: 91 % O2 Device:SpO2: 91 % O2 Flow Rate: .   IO: Intake/output summary:   Intake/Output Summary (Last 24 hours) at 12/08/2020 0750 Last data filed at 12/08/2020 0617 Gross per 24 hour  Intake 540 ml  Output 900 ml  Net -360 ml    LBM: Last BM Date: 12/07/20 Baseline Weight: Weight: 76.2 kg Most recent weight: Weight: 79.7 kg     Palliative Assessment/Data:   Flowsheet Rows   Flowsheet Row Most Recent Value  Intake Tab   Referral Department Hospitalist  Unit at Time of Referral Med/Surg Unit  Palliative Care Primary Diagnosis Sepsis/Infectious Disease  Date Notified 12/07/20  Palliative Care Type New Palliative care  Reason for referral Clarify Goals of Care  Date of Admission 12/04/20  Date first seen by Palliative Care 12/07/20  #  of days Palliative referral response time 0 Day(s)  # of days IP prior to Palliative referral 3  Clinical Assessment   Palliative Performance  Scale Score 40%  Psychosocial & Spiritual Assessment   Palliative Care Outcomes   Patient/Family meeting held? Yes  Who was at the meeting? patient, husband      Time Total: 45 minutes Greater than 50%  of this time was spent counseling and coordinating care related to the above assessment and plan.  Signed by: Micheline Rough, MD   Please contact Palliative Medicine Team phone at [phone] for questions and concerns.  For individual provider: See Shea Evans

## 2020-12-08 NOTE — TOC Progression Note (Signed)
Transition of Care [facility]) - Progression Note    Patient Details  Name: Evarose Altland MRN: [id number] Date of Birth: 01/03/1946  Transition of Care [facility]) CM/SW [place], [place] Phone Number: 12/08/2020, 2:34 PM  Clinical Narrative:  Today, son stated plan has changed and they would like patient to return to [facility] with a palliative referral.  Spoke with Rollene Fare who confirmed that patient can return under those circumstances, but son would need to negotiate payment for increased level of care services at the facility since she is non-ambulatory, for all intents and purposes.  Mr Martinique is fine with doing that as he was already contracting for additional care.  Will plan on returning patient to facility tomorrow after getting confirmation that details have been worked out between Mr Martinique and facility. Referral made to [facility].  TOC will continue to follow during the course of hospitalization.     Expected Discharge Plan: Assisted Living Barriers to Discharge: Barriers Resolved  Expected Discharge Plan and Services Expected Discharge Plan: Assisted Living     Post Acute Care Choice: [place] Living arrangements for the past 2 months: Assisted Living Facility                                       Social Determinants of Health (SDOH) Interventions    Readmission Risk Interventions Readmission Risk Prevention Plan 05/15/2019  Post Dischage Appt Complete  Medication Screening Complete  Transportation Screening Complete

## 2020-12-08 NOTE — Progress Notes (Addendum)
Manufacturing engineer [facility])  Hospital Liaison RN note        Addendum: Al Decant notified this RNCM that that pt was now a hospice referral based on an updated conversation with pt's son  Elta Guadeloupe.  Verified recommendation for hospice with Dr. Domingo Cocking, PMT, who confirmed appropriateness of pt for hospice services. Voicemail with contact info left for son.  Liaisons will follow up tomorrow if not sooner pending return call.  Notified by [facility] manager of patient/family request for [facility] Palliative services at [facility] after discharge.               [place] Palliative team will follow up with patient after discharge.         Please call with any hospice or palliative related questions.         Thank you for the opportunity to participate in this patient's care.     Domenic Moras, BSN, RN [facility] Liaison (listed on [place] under Hospice/[facility])    [phone] (24h on call)

## 2020-12-08 NOTE — Progress Notes (Signed)
Daily Progress Note   Sheila [name] Name: Sheila [name]       Date: 12/08/2020 DOB: 14-Jan-1946  Age: 75 y.o. MRN#: [id number] Attending Physician: Elodia Florence., * Primary Care Physician: Chesley Noon, MD Admit Date: 12/04/2020  Reason for Consultation/Follow-up: Establishing goals of care  Subjective: I met with Sheila [name] this evening.  She reports that she very much wants to be able to return home with Sheila [name] [name] at discharge.  I called and was able to reach Sheila [name].  We discussed clinical course as well as wishes moving forward in regard to advanced directives.  Concepts specific to code status and rehospitalization discussed.  We discussed difference between a aggressive medical intervention path and a palliative, comfort focused care path.  Values and goals of care important to Sheila [name] and family were attempted to be elicited.  We discussed that in light of multiple chronic medical problems that have worsened with this acute problem, care should be focused on interventions that are likely to allow Sheila [name] to achieve goal of getting back to home and spending time with family. I discussed with Sheila [name] regarding heroic interventions at the end-of-life and he agrees this would not be in line with prior expressed wishes for a natural death or be likely to lead to getting well enough to go back home. He was in agreement with changing CODE STATUS to DO NOT RESUSCITATE.  Concept of Hospice and Palliative Care were discussed  Questions and concerns addressed.   PMT will continue to support holistically.  Length of Stay: 4  Current Medications: Scheduled Meds:  . ARIPiprazole  10 mg Oral QHS  . buPROPion  300 mg Oral q morning  . carvedilol  6.25 mg Oral BID WC  . Chlorhexidine  Gluconate Cloth  6 each Topical Daily  . ezetimibe  10 mg Oral Daily  . feeding supplement  237 mL Oral BID BM  . FLUoxetine  40 mg Oral q morning  . lactulose  30 g Oral TID  . liver oil-zinc oxide   Topical BID  . multivitamin with minerals  1 tablet Oral Daily  . nutrition supplement (JUVEN)  1 packet Oral BID BM  . vitamin B-12  1,000 mcg Oral Daily    Continuous Infusions: .  ceFAZolin (ANCEF) IV 2 g (12/08/20  2136)  . metronidazole 500 mg (12/08/20 1438)    PRN Meds:   Physical Exam         General: Alert, awake, in no acute distress.  HEENT: No bruits, no goiter, no JVD Heart: Regular rate and rhythm. No murmur appreciated. Lungs: Good air movement, clear Abdomen: Soft, nontender, nondistended, positive bowel sounds.  Ext: No significant edema Skin: Warm and dry Neuro: Grossly intact, nonfocal.  Vital Signs: BP (!) 143/67   Pulse (!) 59   Temp 98.2 F (36.8 C) (Oral)   Resp 20   Ht _0  (1.651 m)   Wt 79.7 kg   SpO2 97%   BMI 29.24 kg/m  SpO2: SpO2: 97 % O2 Device: O2 Device: Room Air O2 Flow Rate:    Intake/output summary:   Intake/Output Summary (Last 24 hours) at 12/08/2020 2209 Last data filed at 12/08/2020 1700 Gross per 24 hour  Intake 1868.58 ml  Output 650 ml  Net 1218.58 ml   LBM: Last BM Date: 12/08/20 Baseline Weight: Weight: 76.2 kg Most recent weight: Weight: 79.7 kg       Palliative Assessment/Data:    Flowsheet Rows   Flowsheet Row Most Recent Value  Intake Tab   Referral Department Hospitalist  Unit at Time of Referral Med/Surg Unit  Palliative Care Primary Diagnosis Sepsis/Infectious Disease  Date Notified 12/07/20  Palliative Care Type New Palliative care  Reason for referral Clarify Goals of Care  Date of Admission 12/04/20  Date first seen by Palliative Care 12/07/20  # of days Palliative referral response time 0 Day(s)  # of days IP prior to Palliative referral 3  Clinical Assessment   Palliative Performance Scale  Score 40%  Psychosocial & Spiritual Assessment   Palliative Care Outcomes   Sheila [name]/Family meeting held? Yes  Who was at the meeting? Sheila [name]      Sheila [name] Active Problem List   Diagnosis Date Noted  . Acute metabolic encephalopathy 34/91/7915  . UTI (urinary tract infection) 12/04/2020  . Colitis 12/04/2020  . Hypokalemia 10/23/2020  . COVID-19 virus infection 10/23/2020  . Type 2 diabetes mellitus ([place]) 10/23/2020  . Pressure injury of skin 02/11/2020  . Acute hepatic encephalopathy 02/09/2020  . Esophageal varices determined by endoscopy ([place]) 06/30/2019  . Portal hypertensive gastropathy ([place]) 06/30/2019  . Elevated troponin   . Demand ischemia ([place])   . Essential hypertension   . NSTEMI (non-ST elevated myocardial infarction) ([place]) 05/23/2019  . Chest pain 05/22/2019  . Hepatic encephalopathy ([place])   . Liver cirrhosis ([place])   . NASH (nonalcoholic steatohepatitis)   . Weakness 05/13/2019  . AKI (acute kidney injury) ([place]) 05/13/2019    Palliative Care Assessment & Plan   Sheila [name] Profile: 75 y.o. female  with past medical history of [name] process, hepatic cephalopathy, diastolic congestive heart failure, IDDM, CKD, hypertension, hyperlipidemia, anemia, GERD, anxiety, depression, bipolar disorder, debility, chronic indwelling Foley catheter, and sacral decubitus ulcer admitted on 12/04/2020 with confusion and poor p.o. intake and vomiting.  She was admitted with acute hepatic encephalopathy, AKI, dehydration, portal hypertensive colopathy versus colitis and possible catheter associated UTI.  She was started on fluid, lactulose, ceftriaxone and Flagyl.  Recommendations/Plan:  DNR/DNI  Plan for transition back to [facility] with the support of hospice  Goals of Care and Additional Recommendations:  Limitations on Scope of Treatment: Avoid Hospitalization and comfort  Code Status:    Code Status Orders  (From admission, onward)         Start  Ordered    12/08/20 1928  Do not attempt resuscitation (DNR)  Continuous       Question Answer Comment  In the event of cardiac or respiratory ARREST Do not call a "code blue"   In the event of cardiac or respiratory ARREST Do not perform Intubation, CPR, defibrillation or ACLS   In the event of cardiac or respiratory ARREST Use medication by any route, position, wound care, and other measures to relive pain and suffering. May use oxygen, suction and manual treatment of airway obstruction as needed for comfort.      12/08/20 1927        Code Status History    Date Active Date Inactive Code Status Order ID Comments User Context   12/04/2020 2115 12/08/2020 1927 Full Code [id number]  Shela Leff, MD ED   10/22/2020 2224 11/03/2020 0417 Full Code [id number]  Maudie Mercury, MD ED   02/09/2020 1013 02/12/2020 2232 DNR [id number]  Neva Seat, MD ED   05/22/2019 1835 05/26/2019 1810 Full Code [id number]  Asencion Noble, MD ED   05/13/2019 1146 05/15/2019 1943 Full Code [id number]  Welford Roche, MD ED   Advance Care Planning Activity    Advance Directive Documentation   Flowsheet Row Most Recent Value  Type of Advance Directive Healthcare Power of Attorney  Pre-existing out of facility DNR order (yellow form or pink MOST form) --  "MOST" Form in Place? --       Prognosis:   < 6 months most likely  Discharge Planning:  Home with Hospice  Care plan was discussed with Sheila [name]'s [name]  Thank you for allowing the Palliative Medicine Team to assist in the care of this Sheila [name].   Time In: 1855 Time Out: 1940 Total Time 45 Prolonged Time Billed No      Greater than 50%  of this time was spent counseling and coordinating care related to the above assessment and plan.  Micheline Rough, MD  Please contact Palliative Medicine Team phone at [phone] for questions and concerns.

## 2020-12-08 NOTE — Progress Notes (Signed)
Occupational Therapy Treatment Patient Details Name: Sheila [name] MRN: [id number] DOB: October 06, 1945 Today's Date: 12/08/2020    History of present illness 75 y.o. female with medical history significant of Sheila [name] cirrhosis and hepatic encephalopathy, CAD, chronic diastolic CHF, hypertension, hyperlipidemia, non-insulin-dependent type 2 diabetes, CKD stage IIIa, anemia, bipolar disorder, anxiety, depression, GERD presenting to the ED via EMS for evaluation of confusion. Patient diagnosed with Acute hepatic and metabolic encephalopathy   OT comments  Treatment focused on improving participation of self care tasks out of bed and improving functional mobility. Patient able to perform grooming task with set up at side of bed. Patient required use of stedy and +2 min assist to stand from bed and transfer to [facility]. Mod assist to rise from [facility] and use of stedy to return to side of bed. Total assist for toileting. Patient appears to have some plantar flexion contracture and is unable to bring heel to floor - making ambulation difficult. Was able to tolerate weight bearing. With 2 person hand holds patient able to take a few steps to recliner - walking on toes and LEs appeared mildly ataxic. Patient left in recliner with chair alarm. Patient making great progress now that she is cognitively improved. Continue to recommend short term rehab. Patient is very motivated to walk and improve independence.   Follow Up Recommendations  SNF    Equipment Recommendations  None recommended by OT    Recommendations for Other Services      Precautions / Restrictions Precautions Precautions: Fall Precaution Comments: incontinent secondary to lactulose Restrictions Weight Bearing Restrictions: No       Mobility Bed Mobility Overal bed mobility: Needs Assistance Bed Mobility: Supine to Sit   Sidelying to sit: Min assist;HOB elevated       General bed mobility comments: Increased time, use of bed rails and min  assist to scoot to edge of bed to transfer into sitting.    Transfers Overall transfer level: Needs assistance Equipment used: 2 person hand held assist Transfers: Sit to/from [facility] Sit to Stand: Min assist;+2 physical assistance;+2 safety/equipment;From elevated surface Stand pivot transfers: Min assist;+2 physical assistance;+2 safety/equipment       General transfer comment: Sit to stand from elevated bed height min assist x 2 with stedy to transfer to [facility]. mod assist to stand with stedy from [facility] and return to edge of bed. Third sit to stand with 2 hand holds to take steps to recliner (unable to find spare walker on entirety of 5th floor). patient walking on toes and gait appeared somewhat ataxic.    Balance Overall balance assessment: Needs assistance Sitting-balance support: No upper extremity supported Sitting balance-Leahy Scale: Fair Sitting balance - Comments: able to sit edge of bed   Standing balance support: Bilateral upper extremity supported;During functional activity Standing balance-Leahy Scale: Poor                             ADL either performed or assessed with clinical judgement   ADL Overall ADL's : Needs assistance/impaired Eating/Feeding: Set up;Sitting Eating/Feeding Details (indicate cue type and reason): demonstrates ability to feed self ice at bed level and in sitting. Grooming: Set up;Oral care;Wash/dry face;Sitting Grooming Details (indicate cue type and reason): performed oral care in seated position                 Toilet Transfer: Minimal assistance;+2 for physical assistance;+2 for safety/equipment;BSC Toilet Transfer Details (indicate cue type and reason): +  2 min assist and use of stedy to transfer to [place] and Hygiene: Total assistance;Sit to/from stand Toileting - Clothing Manipulation Details (indicate cue type and reason): Total assist for hygiene and clothing management              Vision Patient Visual Report: No change from baseline     Perception     Praxis      Cognition Arousal/Alertness: Awake/alert Behavior During Therapy: WFL for tasks assessed/performed Overall Cognitive Status: Within Functional Limits for tasks assessed                                          Exercises     Shoulder Instructions       General Comments      Pertinent Vitals/ Pain       Pain Assessment: Faces Faces Pain Scale: Hurts little more Pain Location: toes Pain Descriptors / Indicators: Guarding Pain Intervention(s): Limited activity within patient's tolerance;Monitored during session;Repositioned  Home Living                                          Prior Functioning/Environment              Frequency  Min 2X/week        Progress Toward Goals  OT Goals(current goals can now be found in the care plan section)  Progress towards OT goals: Progressing toward goals  Acute Rehab OT Goals Patient Stated Goal: To walk again OT Goal Formulation: With patient Time For Goal Achievement: 12/19/20 Potential to Achieve Goals: Good  Plan Discharge plan remains appropriate    Co-evaluation                 AM-PAC OT "6 Clicks" Daily Activity     Outcome Measure   Help from another person eating meals?: A Little Help from another person taking care of personal grooming?: A Little Help from another person toileting, which includes using toliet, bedpan, or urinal?: Total Help from another person bathing (including washing, rinsing, drying)?: A Lot Help from another person to put on and taking off regular upper body clothing?: A Little Help from another person to put on and taking off regular lower body clothing?: Total 6 Click Score: 13    End of Session Equipment Utilized During Treatment: Gait belt;Other (comment) (stedy)  OT Visit Diagnosis: Other abnormalities of gait and mobility  (R26.89);Muscle weakness (generalized) (M62.81);Pain;Other symptoms and signs involving cognitive function   Activity Tolerance Patient tolerated treatment well   Patient Left in chair;with call bell/phone within reach;with chair alarm set   Nurse Communication Mobility status        Time: 1020-1100 OT Time Calculation (min): 40 min  Charges: OT General Charges $OT Visit: 1 Visit OT Treatments $Self Care/Home Management : 23-37 mins $Therapeutic Activity: 8-22 mins  Chinedu Agustin, OTR/L [place]  Office [phone] Pager: [place] 12/08/2020, 11:19 AM

## 2020-12-09 LAB — CBC WITH DIFFERENTIAL/PLATELET
Abs Immature Granulocytes: 0.02 10*3/uL (ref 0.00–0.07)
Basophils Absolute: 0.1 10*3/uL (ref 0.0–0.1)
Basophils Relative: 1 %
Eosinophils Absolute: 0.3 10*3/uL (ref 0.0–0.5)
Eosinophils Relative: 6 %
HCT: 33.7 % — ABNORMAL LOW (ref 36.0–46.0)
Hemoglobin: 10.9 g/dL — ABNORMAL LOW (ref 12.0–15.0)
Immature Granulocytes: 0 %
Lymphocytes Relative: 16 %
Lymphs Abs: 0.7 10*3/uL (ref 0.7–4.0)
MCH: 31.3 pg (ref 26.0–34.0)
MCHC: 32.3 g/dL (ref 30.0–36.0)
MCV: 96.8 fL (ref 80.0–100.0)
Monocytes Absolute: 0.6 10*3/uL (ref 0.1–1.0)
Monocytes Relative: 13 %
Neutro Abs: 2.9 10*3/uL (ref 1.7–7.7)
Neutrophils Relative %: 64 %
Platelets: 114 10*3/uL — ABNORMAL LOW (ref 150–400)
RBC: 3.48 MIL/uL — ABNORMAL LOW (ref 3.87–5.11)
RDW: 22 % — ABNORMAL HIGH (ref 11.5–15.5)
WBC: 4.5 10*3/uL (ref 4.0–10.5)
nRBC: 0 % (ref 0.0–0.2)

## 2020-12-09 LAB — COMPREHENSIVE METABOLIC PANEL
ALT: 21 U/L (ref 0–44)
AST: 40 U/L (ref 15–41)
Albumin: 2.3 g/dL — ABNORMAL LOW (ref 3.5–5.0)
Alkaline Phosphatase: 103 U/L (ref 38–126)
Anion gap: 7 (ref 5–15)
BUN: 21 mg/dL (ref 8–23)
CO2: 17 mmol/L — ABNORMAL LOW (ref 22–32)
Calcium: 8.8 mg/dL — ABNORMAL LOW (ref 8.9–10.3)
Chloride: 108 mmol/L (ref 98–111)
Creatinine, Ser: 0.97 mg/dL (ref 0.44–1.00)
GFR, Estimated: >60 mL/min (ref >60)
Glucose, Bld: 100 mg/dL — ABNORMAL HIGH (ref 70–99)
Potassium: 3.5 mmol/L (ref 3.5–5.1)
Sodium: 132 mmol/L — ABNORMAL LOW (ref 135–145)
Total Bilirubin: 0.7 mg/dL (ref 0.3–1.2)
Total Protein: 5.3 g/dL — ABNORMAL LOW (ref 6.5–8.1)

## 2020-12-09 LAB — PROTIME-INR
INR: 1.5 — ABNORMAL HIGH (ref 0.8–1.2)
Prothrombin Time: 17.1 seconds — ABNORMAL HIGH (ref 11.4–15.2)

## 2020-12-09 LAB — MAGNESIUM: Magnesium: 1.7 mg/dL (ref 1.7–2.4)

## 2020-12-09 LAB — PHOSPHORUS: Phosphorus: 2.4 mg/dL — ABNORMAL LOW (ref 2.5–4.6)

## 2020-12-09 MED ORDER — CEPHALEXIN 500 MG PO CAPS: 500.0000 mg | ORAL_CAPSULE | Freq: Four times a day (QID) | ORAL | 0 refills | Status: AC

## 2020-12-09 MED ORDER — LACTULOSE 10 GM/15ML PO SOLN: 30.0000 g | Freq: Three times a day (TID) | ORAL | 0 refills | Status: AC

## 2020-12-09 MED ORDER — CYANOCOBALAMIN 1000 MCG PO TABS: 1000.0000 ug | ORAL_TABLET | Freq: Every day | ORAL | 0 refills | Status: AC

## 2020-12-09 MED ORDER — CARVEDILOL 6.25 MG PO TABS: 6.2500 mg | ORAL_TABLET | Freq: Two times a day (BID) | ORAL | 0 refills | Status: AC

## 2020-12-09 NOTE — Progress Notes (Signed)
Daily Progress Note   Patient Name: Sheila [name]       Date: 12/09/2020 DOB: June 02, 1946  Age: 75 y.o. MRN#: [id number] Attending Physician: No att. providers found Primary Care Physician: Chesley Noon, MD Admit Date: 12/04/2020  Reason for Consultation/Follow-up: Establishing goals of care  Subjective: I briefly checked in on Ms. Burks this morning.  She denies complaints reports looking forward to transitioning back to [facility] with her husband.  Denies any acute symptom management needs today.  Plan for discharge back to assisted living with hospice.  Length of Stay: 5  Current Medications: Scheduled Meds:  . ARIPiprazole  10 mg Oral QHS  . buPROPion  300 mg Oral q morning  . carvedilol  6.25 mg Oral BID WC  . Chlorhexidine Gluconate Cloth  6 each Topical Daily  . ezetimibe  10 mg Oral Daily  . feeding supplement  237 mL Oral BID BM  . FLUoxetine  40 mg Oral q morning  . lactulose  30 g Oral TID  . liver oil-zinc oxide   Topical BID  . multivitamin with minerals  1 tablet Oral Daily  . nutrition supplement (JUVEN)  1 packet Oral BID BM  . vitamin B-12  1,000 mcg Oral Daily    Continuous Infusions: .  ceFAZolin (ANCEF) IV 2 g (12/09/20 0531)  . metronidazole 500 mg (12/09/20 0610)    PRN Meds:   Physical Exam         General: Alert, awake, in no acute distress.  HEENT: No bruits, no goiter, no JVD Heart: Regular rate and rhythm. No murmur appreciated. Lungs: Good air movement, clear Abdomen: Soft, nontender, nondistended, positive bowel sounds.  Ext: No significant edema Skin: Warm and dry Neuro: Grossly intact, nonfocal.  Vital Signs: BP (!) 117/56 (BP Location: Left Arm)   Pulse 62   Temp 98.3 F (36.8 C)   Resp 16   Ht 5' 5"  (1.651 m)   Wt  79.3 kg   SpO2 97%   BMI 29.09 kg/m  SpO2: SpO2: 97 % O2 Device: O2 Device: Nasal Cannula O2 Flow Rate:    Intake/output summary:   Intake/Output Summary (Last 24 hours) at 12/09/2020 1521 Last data filed at 12/09/2020 0900 Gross per 24 hour  Intake 2098.58 ml  Output 200 ml  Net 1898.58 ml  LBM: Last BM Date: 12/08/20 Baseline Weight: Weight: 76.2 kg Most recent weight: Weight: 79.3 kg       Palliative Assessment/Data:    Flowsheet Rows   Flowsheet Row Most Recent Value  Intake Tab   Referral Department Hospitalist  Unit at Time of Referral Med/Surg Unit  Palliative Care Primary Diagnosis Sepsis/Infectious Disease  Date Notified 12/07/20  Palliative Care Type New Palliative care  Reason for referral Clarify Goals of Care  Date of Admission 12/04/20  Date first seen by Palliative Care 12/07/20  # of days Palliative referral response time 0 Day(s)  # of days IP prior to Palliative referral 3  Clinical Assessment   Palliative Performance Scale Score 40%  Psychosocial & Spiritual Assessment   Palliative Care Outcomes   Patient/Family meeting held? Yes  Who was at the meeting? patient, husband      Patient Active Problem List   Diagnosis Date Noted  . Acute metabolic encephalopathy 05/39/7673  . UTI (urinary tract infection) 12/04/2020  . Colitis 12/04/2020  . Hypokalemia 10/23/2020  . COVID-19 virus infection 10/23/2020  . Type 2 diabetes mellitus ([place]) 10/23/2020  . Pressure injury of skin 02/11/2020  . Acute hepatic encephalopathy 02/09/2020  . Esophageal varices determined by endoscopy ([place]) 06/30/2019  . Portal hypertensive gastropathy ([place]) 06/30/2019  . Elevated troponin   . Demand ischemia ([place])   . Essential hypertension   . NSTEMI (non-ST elevated myocardial infarction) ([place]) 05/23/2019  . Chest pain 05/22/2019  . Hepatic encephalopathy ([place])   . Liver cirrhosis ([place])   . NASH (nonalcoholic steatohepatitis)   . Weakness 05/13/2019  . AKI (acute  kidney injury) ([place]) 05/13/2019    Palliative Care Assessment & Plan   Patient Profile: 75 y.o. female  with past medical history of [name] process, hepatic cephalopathy, diastolic congestive heart failure, IDDM, CKD, hypertension, hyperlipidemia, anemia, GERD, anxiety, depression, bipolar disorder, debility, chronic indwelling Foley catheter, and sacral decubitus ulcer admitted on 12/04/2020 with confusion and poor p.o. intake and vomiting.  She was admitted with acute hepatic encephalopathy, AKI, dehydration, portal hypertensive colopathy versus colitis and possible catheter associated UTI.  She was started on fluid, lactulose, ceftriaxone and Flagyl.  Recommendations/Plan:  DNR/DNI  Plan for transition back to [place] with the support of hospice  Goals of Care and Additional Recommendations:  Limitations on Scope of Treatment: Avoid Hospitalization and comfort  Code Status:    Code Status Orders  (From admission, onward)         Start     Ordered   12/08/20 1928  Do not attempt resuscitation (DNR)  Continuous       Question Answer Comment  In the event of cardiac or respiratory ARREST Do not call a "code blue"   In the event of cardiac or respiratory ARREST Do not perform Intubation, CPR, defibrillation or ACLS   In the event of cardiac or respiratory ARREST Use medication by any route, position, wound care, and other measures to relive pain and suffering. May use oxygen, suction and manual treatment of airway obstruction as needed for comfort.      12/08/20 1927        Code Status History    Date Active Date Inactive Code Status Order ID Comments User Context   12/04/2020 2115 12/08/2020 1927 Full Code [id number]  Shela Leff, MD ED   10/22/2020 2224 11/03/2020 0417 Full Code [id number]  Maudie Mercury, MD ED   02/09/2020 1013 02/12/2020 2232 DNR [id number]  Neva Seat, MD ED  05/22/2019 1835 05/26/2019 1810 Full Code [id number]  Asencion Noble, MD ED   05/13/2019  1146 05/15/2019 1943 Full Code [id number]  Welford Roche, MD ED   Advance Care Planning Activity    Advance Directive Documentation   Flowsheet Row Most Recent Value  Type of Advance Directive Healthcare Power of Attorney  Pre-existing out of facility DNR order (yellow form or pink MOST form) --  "MOST" Form in Place? --       Prognosis:   < 6 months most likely  Discharge Planning:  Home with Hospice  Care plan was discussed with patient  Thank you for allowing the Palliative Medicine Team to assist in the care of this patient.   Total Time 20 Prolonged Time Billed No   Greater than 50%  of this time was spent counseling and coordinating care related to the above assessment and plan.  Micheline Rough, MD  Please contact Palliative Medicine Team phone at [phone] for questions and concerns.

## 2020-12-09 NOTE — Discharge Summary (Addendum)
Physician Discharge Summary  Hudsyn Barich OEV:[id number] DOB: Feb 08, 1946 DOA: 12/04/2020  PCP: Chesley Noon, MD  Admit date: 12/04/2020 Discharge date: 12/09/2020  Time spent: 40 minutes  Recommendations for Outpatient Follow-up:  1. Follow outpatient CBC/CMP 2. Follow indwelling foley outpatient, consider urology follow up 3. Follow hemorrhagic renal cysts/microscopic hematuria outpatient, urology follow up  4. Follow blood pressure with adjustments 5. Follow volume status off lasix/spironolactone - adjust as needed outpatient 6. Follow decubitus ulcers outpatient, continue wound care, frequent turns 7. Discharging home with hospice, follow outpatient   Discharge Diagnoses:  Principal Problem:   Acute metabolic encephalopathy Active Problems:   AKI (acute kidney injury) ([place])   Acute hepatic encephalopathy   Pressure injury of skin   UTI (urinary tract infection)   Colitis   Discharge Condition: stable  Diet recommendation: heart healthy/carb mod  Filed Weights   12/05/20 0500 12/06/20 0444 12/09/20 0500  Weight: 73.4 kg 79.7 kg 79.3 kg   History of present illness:  75 year old F with PMH of NASH cirrhosis, hepatic encephalopathy, diastolic CHF, and IDDM-2, CKD-3A, HTN, HLD, anemia, GERD, anxiety, depression, bipolar disorder, debility, chronic indwelling Foley and sacral decubitus brought to ED with confusion, poor p.o. intake and vomiting, and admitted for acute hepatic encephalopathy, AKI, dehydration, possible portal hypertensive colopathy versus colitis and possible CAUTI. She's improved on IV fluid, lactulose, ceftriaxone and Flagyl.  Palliative care was consulted and plan is for discharge back to [facility] with hospice following.   Hospital Course:  Goals of Care Appreciate palliative care assistance, plan for d/c to [facility] with hospice following.  Son has noted significant decline in past several months.  Desired home with hospice.  He's had experiences with  nursing facilities in past and wants to avoid this for his mother if possible.  Wants to try to get her back to [facility].    Acute hepaticandmetabolic encephalopathy:In the setting of hepatic encephalopathy and E. coli UTI. No focal neuro deficit other than generalized weakness. CT head without acute finding.Ammonia peaked at 95 and decreased to 30. Encephalopathy seems to have resolved. Oriented x3 today.  -Continue lactulose with goal of 2-3 BM daily -Continue treatment for UTI and possible colitis -Reorientation and delirium precautions  Decompensated NASH cirrhosis with hepatic encephalopathy:Appears euvolemic. -Lactulose as above -MELD today 11 -> ~6% 3 month mortality, discussed with son -Continue holding Lasix and Aldactone in the setting of AKI and dehydration - follow outpatient to consider need for resumption  CAUTI: this is difficult diagnosis and have to go by urinalysis and urine culture.Heart LLQ tenderness on admission.Urine culture with pansensitiveE. Coli. Changed indwelling Foley catheter on 4/3 -IV ceftriaxone 4/2-4/5>>IV Ancef 4/5>> keflex x 2 days at discharge for total 7 day course. -consider outpatient follow up with urology  Possible colitis:CT findings suspicious for portal hypertensive colopathy versus focal right colitis. Initially,had LLQ tenderness is resolved.  -S/p 5 days flagyl -Ceftriaxone and Ancef as above  AKI/azotemia:Initially thought to have CKD-3A.AKI resolved.  Hypercalcemia:corrects to 10.2. Resolved.   Hyponatremia:Follow.  Worse today, follow outpatient.   Chronic diastolic CHF: TTE in 04/2992 with LVEF of 50 to 55%, G2 DD, severe LAE, normal RVSP.I and O incomplete. Appears euvolemic. -Continue holding Lasix and spironolactone -Monitor fluidand respiratory status outpatient and resume as indicated.  Controlled NIDDM-2 with hyperlipidemia: A1c 5.4% on 10/23/2020. No longer on meds.  Hemorrhagic renal  cyst/microscopic hematuria: multiple bilateral renal cysts with Boruch Manuele hemorrhagic cyst on the right noted on CT abdomen and pelvis. UA with evidence of  microscopic hematuria (21-50 RBCs per high-power field). Mild drop in Hgb likely dilutional from IV fluids and holding diuretics. -SCD for VTE prophylaxis -Outpatient follow-up with urology  Essential hypertension: On hydralazine and diuretics at home. -Changed hydralazine to Coreg given history of liver cirrhosis -Continue holding home diuretics.  Anxiety, depression and bipolar disorder: Stable -Continue home Abilify, Prozac and Wellbutrin.  Pancytopenia: Likelydue to liver cirrhosis and dilution from IV fluids -Continue monitoring  Debility:Lately bedbound. Maximum assist for transfer from bed to wheelchair.  Overweight Body mass index is 29.24 kg/m. Nutrition Problem: Increased nutrient needs Etiology: wound healing Signs/Symptoms: estimated needs Interventions: Ensure Enlive (each supplement provides 350kcal and 20 grams of protein),Juven,MVI  Pressure skin injury:POA -Appreciate input by WOCN. Pressure Injury 02/09/20 Mid;Right;Left;Medial Stage 1 -  Intact skin with non-blanchable redness of Admiral Marcucci localized area usually over Catheryne Deford bony prominence. Non blanchable redness on sacral area. (Active)  02/09/20 1355  Location:   Location Orientation: Mid;Right;Left;Medial  Staging: Stage 1 -  Intact skin with non-blanchable redness of Abbigaile Rockman localized area usually over Saxton Chain bony prominence.  Wound Description (Comments): Non blanchable redness on sacral area.  Present on Admission: Yes     Pressure Injury 12/04/20 Buttocks Right;Left Stage 2 -  Partial thickness loss of dermis presenting as Jocelyne Reinertsen shallow open injury with Charee Tumblin red, pink wound bed without slough. (Active)  12/04/20 2230  Location: Buttocks  Location Orientation: Right;Left  Staging: Stage 2 -  Partial thickness loss of dermis presenting as Emery Dupuy shallow open injury with Karla Pavone red, pink  wound bed without slough.  Wound Description (Comments):   Present on Admission: Yes   Procedures: none  Consultations: Palliative care  Discharge Exam: Vitals:   12/08/20 2004 12/09/20 0552  BP: (!) 143/67 122/64  Pulse: (!) 59 (!) 58  Resp: 20 19  Temp:  98 F (36.7 C)  SpO2: 97% 96%   No new complaints Nickholas Goldston&Ox3 today Discussed d/c plan with son.  Discussed her wounds, meld score.  He noted she's declined significantly in past several months.  She didn't do well with rehab previously.  Wants to avoid nursing home due to his previous experience with this with other family.  We discussed overall goal for hospice is comfort.  General: No acute distress. Cardiovascular: Heart sounds show Loredana Medellin regular rate, and rhythm Lungs: Clear to auscultation bilaterally  Abdomen: Soft, nontender, nondistended  Neurological: Alert and oriented 3. Moves all extremities 4 . Cranial nerves II through XII grossly intact. Skin: stage 2 decubitus ulcer to sacrum Extremities: No clubbing or cyanosis. No edema.  Discharge Instructions   Discharge Instructions    (HEART FAILURE PATIENTS) Call MD:  Anytime you have any of the following symptoms: 1) 3 pound weight gain in 24 hours or 5 pounds in 1 week 2) shortness of breath, with or without Sherlonda Flater dry hacking cough 3) swelling in the hands, feet or stomach 4) if you have to sleep on extra pillows at night in order to breathe.   Complete by: As directed    Call MD for:  difficulty breathing, headache or visual disturbances   Complete by: As directed    Call MD for:  extreme fatigue   Complete by: As directed    Call MD for:  hives   Complete by: As directed    Call MD for:  persistant dizziness or light-headedness   Complete by: As directed    Call MD for:  persistant nausea and vomiting   Complete by: As directed  Call MD for:  redness, tenderness, or signs of infection (pain, swelling, redness, odor or green/yellow discharge around incision site)    Complete by: As directed    Call MD for:  severe uncontrolled pain   Complete by: As directed    Call MD for:  temperature >100.4   Complete by: As directed    Diet - low sodium heart healthy   Complete by: As directed    Diet Carb Modified   Complete by: As directed    Discharge instructions   Complete by: As directed    You were seen for confusion related to hepatic encephalopathy, urinary tract infection, and possible colitis.   You've improved with the lactulose and antibiotics.  Continue the lactulose 3 times Saniyah Mondesir day (for Sheanna Dail goal of 2-3 bowel movements daily).  You can hold doses if you've had 2-3 bowel movements in Sandeep Delagarza day.   We've held your spironolactone and lasix.  Follow with your primary providers outpatient to determine if this needs to be restarted or if it can be held.  Your hydralazine has been discontinued and you were started on coreg.    You were started on vitamin b12 for borderline low b12 levels.  Follow outpatient with your PCP.   Our plan is to get you back home to [facility] with hospice at discharge.   Return for new, recurrent, or worsening symptoms.  Please ask your PCP to request records from this hospitalization so they know what was done and what the next steps will be.   Discharge wound care:   Complete by: As directed    Apply Desitin to buttocks BID and PRN when turning and cleaning.  Leave foam dressing off to avoid stool being trapped underneath the dressing against skin.   Increase activity slowly   Complete by: As directed      Allergies as of 12/09/2020      Reactions   Acetaminophen Other (See Comments)   Patient avoids Tylenol due to fatty liver disease   Cefazolin Other (See Comments)   Caused C-DIF   Ciprofloxacin Hcl Nausea Only   Ibuprofen Other (See Comments)   Diabetic--Concerns about kidneys   Sulfa Antibiotics Hives, Itching   Naproxen Sodium Other (See Comments)   Causes pain in veins   Amoxicillin Diarrhea   Did it involve  swelling of the face/tongue/throat, SOB, or low BP? No Did it involve sudden or severe rash/hives, skin peeling, or any reaction on the inside of your mouth or nose? No Did you need to seek medical attention at [facility] or doctor's office? No When did it last happen?several years If all above answers are "NO", may proceed with cephalosporin use.      Medication List    STOP taking these medications   furosemide 20 MG tablet Commonly known as: LASIX   hydrALAZINE 10 MG tablet Commonly known as: APRESOLINE   lactulose (encephalopathy) 10 GM/15ML Soln Commonly known as: CHRONULAC   spironolactone 100 MG tablet Commonly known as: ALDACTONE     TAKE these medications   ARIPiprazole 10 MG tablet Commonly known as: ABILIFY Take 10 mg by mouth at bedtime.   bethanechol 5 MG tablet Commonly known as: URECHOLINE Take 5 mg by mouth 3 (three) times daily.   buPROPion 300 MG 24 hr tablet Commonly known as: WELLBUTRIN XL Take 300 mg by mouth every morning.   carvedilol 6.25 MG tablet Commonly known as: COREG Take 1 tablet (6.25 mg total) by mouth 2 (two)  times daily with Makyiah Lie meal.   cephALEXin 500 MG capsule Commonly known as: KEFLEX Take 1 capsule (500 mg total) by mouth 4 (four) times daily for 2 days.   clobetasol cream 0.05 % Commonly known as: TEMOVATE Apply 1 application topically daily as needed for rash.   cyanocobalamin 1000 MCG tablet Take 1 tablet (1,000 mcg total) by mouth daily.   diclofenac sodium 1 % Gel Commonly known as: VOLTAREN Apply 2 g topically 2 (two) times daily as needed for pain.   ezetimibe 10 MG tablet Commonly known as: ZETIA Take 10 mg by mouth daily.   FLUoxetine 40 MG capsule Commonly known as: PROZAC Take 40 mg by mouth every morning.   lactulose 10 GM/15ML solution Commonly known as: CHRONULAC Take 45 mLs (30 g total) by mouth 3 (three) times daily. Goal 2-3 BM's daily What changed:   when to take this  additional  instructions   omeprazole 40 MG capsule Commonly known as: PRILOSEC Take 40 mg by mouth every morning.   ondansetron 4 MG tablet Commonly known as: ZOFRAN Take 4 mg by mouth every 8 (eight) hours as needed for nausea or vomiting.   Systane 0.4-0.3 % Soln Generic drug: Polyethyl Glycol-Propyl Glycol Place 1 drop into both eyes 2 (two) times daily.   VIACTIV PO Take 1 tablet by mouth daily.            Discharge Care Instructions  (From admission, onward)         Start     Ordered   12/09/20 0000  Discharge wound care:       Comments: Apply Desitin to buttocks BID and PRN when turning and cleaning.  Leave foam dressing off to avoid stool being trapped underneath the dressing against skin.   12/09/20 0925         Allergies  Allergen Reactions  . Acetaminophen Other (See Comments)    Patient avoids Tylenol due to fatty liver disease  . Cefazolin Other (See Comments)    Caused C-DIF   . Ciprofloxacin Hcl Nausea Only  . Ibuprofen Other (See Comments)    Diabetic--Concerns about kidneys  . Sulfa Antibiotics Hives and Itching  . Naproxen Sodium Other (See Comments)    Causes pain in veins  . Amoxicillin Diarrhea    Did it involve swelling of the face/tongue/throat, SOB, or low BP? No Did it involve sudden or severe rash/hives, skin peeling, or any reaction on the inside of your mouth or nose? No Did you need to seek medical attention at [facility] or doctor's office? No When did it last happen?several years If all above answers are "NO", may proceed with cephalosporin use.     Contact information for after-discharge care    Destination    [facility] .   Service: Skilled Nursing Contact information: [address] [facility] [phone]                   The results of significant diagnostics from this hospitalization (including imaging, microbiology, ancillary and laboratory) are listed below  for reference.    Significant Diagnostic Studies: CT Abdomen Pelvis Wo Contrast  Result Date: 12/04/2020 CLINICAL DATA:  Acute abdominal pain. Confusion beginning last night. History of liver failure. High ammonia levels. EXAM: CT ABDOMEN AND PELVIS WITHOUT CONTRAST TECHNIQUE: Multidetector CT imaging of the abdomen and pelvis was performed following the standard protocol without IV contrast. COMPARISON:  Ultrasound abdomen 10/06/2020. CT abdomen and pelvis  05/24/2019 FINDINGS: Lower chest: Mild dependent changes in the lung bases. Hepatobiliary: Changes of hepatic cirrhosis with enlarged lateral segment left and caudate lobes and atrophy of the right lobe. Diffuse nodular contour to the liver. No focal lesions demonstrated on noncontrast imaging. Gallbladder is surgically absent. No bile duct dilatation. Pancreas: Unremarkable. No pancreatic ductal dilatation or surrounding inflammatory changes. Spleen: Normal in size without focal abnormality. Adrenals/Urinary Tract: No adrenal gland nodules. No hydronephrosis or hydroureter of the kidneys. Multiple bilateral renal cysts. Hyperdense cyst on the right likely represents Glendi Mohiuddin hemorrhagic cyst. Bladder is completely decompressed with [address] Foley catheter. Stomach/Bowel: Stomach is decompressed. Redundant sigmoid colon. Colon is diffusely stool-filled with fluid in the rectum consistent with diarrhea. Wall thickening in the right colon with pericolonic stranding likely representing portal hypertensive colopathy. Focal colitis would be an alternative possibility. No obstruction. Small bowel are mostly decompressed. Vascular/Lymphatic: Diffuse aortic calcification. No aneurysm. No significant lymphadenopathy in the retroperitoneum. Scattered retroperitoneal, porta hepatis, and mesenteric lymph nodes are probably reactive related to cirrhosis. Reproductive: Uterus is surgically absent. No abnormal adnexal lesions. Other: No free air or free fluid in the abdomen. Abdominal  wall musculature appears intact. Musculoskeletal: Spondylolysis with mild spondylolisthesis at L5-S1. Degenerative changes in the spine. No destructive bone lesions. IMPRESSION: 1. Changes of hepatic cirrhosis. 2. Wall thickening in the right colon with pericolonic stranding likely representing portal hypertensive colopathy. Focal colitis would be an alternative possibility. 3. Multiple bilateral renal cysts. Hyperdense cyst on the right likely represents Sparrow Sir[place]usa hemorrhagic cyst. 4. Diffuse aortic calcification. Aortic Atherosclerosis (ICD10-I70.0). Electronically Signed   By: Lucienne Capers M.D.   On: 12/04/2020 19:25   CT Head Wo Contrast  Result Date: 12/04/2020 CLINICAL DATA:  Mental status changes EXAM: CT HEAD WITHOUT CONTRAST TECHNIQUE: Contiguous axial images were obtained from the base of the skull through the vertex without intravenous contrast. COMPARISON:  10/22/2020 FINDINGS: Brain: There is atrophy and chronic small vessel disease changes. No acute intracranial abnormality. Specifically, no hemorrhage, hydrocephalus, mass lesion, acute infarction, or significant intracranial injury. Vascular: No hyperdense vessel or unexpected calcification. Skull: No acute calvarial abnormality. Sinuses/Orbits: No acute findings Other: None IMPRESSION: Atrophy, chronic microvascular disease. No acute intracranial abnormality. Electronically Signed   By: Rolm Baptise M.D.   On: 12/04/2020 19:15    Microbiology: Recent Results (from the past 240 hour(s))  Urine culture     Status: Abnormal   Collection Time: 12/04/20  6:51 PM   Specimen: Urine, Clean Catch  Result Value Ref Range Status   Specimen Description   Final    URINE, CLEAN CATCH Performed at [facility], [place] [address] [place], [place] [address]    Special Requests   Final    NONE Performed at [facility], [place] [address] [place], [place] [address]    Culture >=100,000 COLONIES/mL ESCHERICHIA COLI (Gavino Fouch)   Final   Report Status 12/07/2020 FINAL  Final   Organism ID, Bacteria ESCHERICHIA COLI (Izzah Pasqua)  Final      Susceptibility   Escherichia coli - MIC*    AMPICILLIN <=2 SENSITIVE Sensitive     CEFAZOLIN <=4 SENSITIVE Sensitive     CEFEPIME <=0.12 SENSITIVE Sensitive     CEFTRIAXONE <=0.25 SENSITIVE Sensitive     CIPROFLOXACIN <=0.25 SENSITIVE Sensitive     GENTAMICIN <=1 SENSITIVE Sensitive     IMIPENEM <=0.25 SENSITIVE Sensitive     NITROFURANTOIN <=16 SENSITIVE Sensitive     TRIMETH/SULFA <=20 SENSITIVE Sensitive     AMPICILLIN/SULBACTAM <=  2 SENSITIVE Sensitive     PIP/TAZO <=4 SENSITIVE Sensitive     * >=100,000 COLONIES/mL ESCHERICHIA COLI  SARS CORONAVIRUS 2 (TAT 6-24 HRS) Nasopharyngeal Nasopharyngeal Swab     Status: None   Collection Time: 12/04/20  9:29 PM   Specimen: Nasopharyngeal Swab  Result Value Ref Range Status   SARS Coronavirus 2 NEGATIVE NEGATIVE Final    Comment: (NOTE) SARS-CoV-2 target nucleic acids are NOT DETECTED.  The SARS-CoV-2 RNA is generally detectable in upper and lower respiratory specimens during the acute phase of infection. Negative results do not preclude SARS-CoV-2 infection, do not rule out co-infections with other pathogens, and should not be used as the sole basis for treatment or other patient management decisions. Negative results must be combined with clinical observations, patient history, and epidemiological information. The expected result is Negative.  Fact Sheet for Patients: SugarRoll.be  Fact Sheet for Healthcare Providers: https://www.woods-mathews.com/  This test is not yet approved or cleared by the [place] FDA and  has been authorized for detection and/or diagnosis of SARS-CoV-2 by FDA under an Emergency Use Authorization (EUA). This EUA will remain  in effect (meaning this test can be used) for the duration of the COVID-19 declaration under Se ction 564(b)(1) of the Act, 21  U.S.C. section 360bbb-3(b)(1), unless the authorization is terminated or revoked sooner.  Performed at [facility], [place] [address], [place], [place] [address]   Culture, blood (routine x 2)     Status: None (Preliminary result)   Collection Time: 12/05/20 12:41 AM   Specimen: BLOOD RIGHT ARM  Result Value Ref Range Status   Specimen Description   Final    BLOOD RIGHT ARM Performed at [facility] [address], [place], [place] [address]    Special Requests   Final    BOTTLES DRAWN AEROBIC ONLY Blood Culture adequate volume Performed at [facility] [address], [place], [place] [address]    Culture   Final    NO GROWTH 3 DAYS Performed at [facility], [place] [address] [place], [place] [address]    Report Status PENDING  Incomplete  Culture, blood (routine x 2)     Status: None (Preliminary result)   Collection Time: 12/05/20 12:41 AM   Specimen: BLOOD  Result Value Ref Range Status   Specimen Description   Final    BLOOD BLOOD RIGHT HAND Performed at [facility] [address], [place], [place] [address]    Special Requests   Final    BOTTLES DRAWN AEROBIC ONLY Blood Culture adequate volume Performed at [facility] [address] [place], [place] [address]    Culture   Final    NO GROWTH 3 DAYS Performed at [facility], [place] [address], [place], [place] [address]    Report Status PENDING  Incomplete  MRSA PCR Screening     Status: None   Collection Time: 12/05/20  2:58 AM   Specimen: Nasopharyngeal  Result Value Ref Range Status   MRSA by PCR NEGATIVE NEGATIVE Final    Comment:        The GeneXpert MRSA Assay (FDA approved for NASAL specimens only), is one component of Briseis Aguilera comprehensive MRSA colonization surveillance program. It is not intended to diagnose MRSA infection nor to guide or monitor treatment for MRSA infections. Performed at [facility], [place] [address], [place], [place] [address]   SARS CORONAVIRUS 2 (TAT 6-24  HRS) Nasopharyngeal Nasopharyngeal Swab     Status: None   Collection Time: 12/07/20  2:02 PM   Specimen: Nasopharyngeal Swab  Result Value Ref Range Status   SARS Coronavirus 2 NEGATIVE NEGATIVE Final    Comment: (NOTE) SARS-CoV-2 target nucleic acids are NOT DETECTED.  The SARS-CoV-2 RNA is generally detectable in upper and lower respiratory specimens during the acute phase of infection. Negative results do not preclude SARS-CoV-2 infection, do not rule out co-infections with other pathogens, and should not be used as the sole basis for treatment or other patient management decisions. Negative results must be combined with clinical observations, patient history, and epidemiological information. The expected result is Negative.  Fact Sheet for Patients: SugarRoll.be  Fact Sheet for Healthcare Providers: https://www.woods-mathews.com/  This test is not yet approved or cleared by the [place] FDA and  has been authorized for detection and/or diagnosis of SARS-CoV-2 by FDA under an Emergency Use Authorization (EUA). This EUA will remain  in effect (meaning this test can be used) for the duration of the COVID-19 declaration under Se ction 564(b)(1) of the Act, 21 U.S.C. section 360bbb-3(b)(1), unless the authorization is terminated or revoked sooner.  Performed at [facility], [place] [address], [place], [place] [address]      Labs: Basic Metabolic Panel: Recent Labs  Lab 12/05/20 0816 12/06/20 0033 12/07/20 0342 12/08/20 0035 12/09/20 0322  NA 135 138 140 138 132*  K 4.4 4.5 4.1 3.9 3.5  CL 108 111 113* 114* 108  CO2 20* 19* 22 19* 17*  GLUCOSE 87 87 70 77 100*  BUN 32* 25* 23 20 21   CREATININE 1.18* 1.18* 0.97 0.90 0.97  CALCIUM 10.2 9.8 9.5 9.3 8.8*  MG 2.1 1.8 1.8 1.8 1.7  PHOS 2.9 2.7 1.9* 2.7 2.4*   Liver Function  Tests: Recent Labs  Lab 12/04/20 1851 12/05/20 0816 12/06/20 0033 12/07/20 0342 12/08/20 0035 12/09/20 0322  AST 48*  --   --  48* 42* 40  ALT 31  --   --  25 24 21   ALKPHOS 118  --   --  102 123 103  BILITOT 1.4*  --   --  0.8 0.7 0.7  PROT 6.7  --   --  5.5* 5.6* 5.3*  ALBUMIN 2.9* 2.7* 2.4* 2.3* 2.3* 2.3*   No results for input(s): LIPASE, AMYLASE in the last 168 hours. Recent Labs  Lab 12/05/20 0041 12/05/20 0816 12/06/20 0927 12/07/20 0427 12/08/20 0035  AMMONIA 86* 93* 55* 30 30   CBC: Recent Labs  Lab 12/04/20 1851 12/05/20 0816 12/06/20 0033 12/07/20 0342 12/08/20 0035 12/09/20 0322  WBC 5.1 4.4 3.7* 3.9* 4.8 4.5  NEUTROABS 3.2  --   --  2.2  --  2.9  HGB 12.4 13.0 11.6* 11.1* 11.5* 10.9*  HCT 37.2 38.5 34.9* 34.5* 34.4* 33.7*  MCV 93.0 92.5 94.8 94.8 95.3 96.8  PLT 152 131* 120* 116* 115* 114*   Cardiac Enzymes: No results for input(s): CKTOTAL, CKMB, CKMBINDEX, TROPONINI in the last 168 hours. BNP: BNP (last 3 results) No results for input(s): BNP in the last 8760 hours.  ProBNP (last 3 results) No results for input(s): PROBNP in the last 8760 hours.  CBG: Recent Labs  Lab 12/04/20 2234 12/05/20 0728 12/05/20 1125 12/05/20 1557 12/05/20 2044  GLUCAP 76 89 101* 116* 93       Signed:  Fayrene Helper MD.  [facility] 12/09/2020, 9:37 AM

## 2020-12-09 NOTE — Progress Notes (Signed)
[facility])  Chart and pt information have been reviewed by [facility] physician.  Hospice eligibility confirmed.  Hospital liaison spoke with yesterday with Elta Guadeloupe, pt's son.  Per discussion the plan is to discharge home today by PTAR.    [place] send signed and completed DNR home with pt/family.  Please provide prescriptions at discharge as needed to ensure ongoing symptom management until pt can be admitted onto hospice services.    DME needs discussed.   [Needs?  Orders?  Family denies any DME needs at this time.]  Address has been verified and is correct in the chart.  [facility] information and contact numbers given to [facility].  Above information shared with [facility] Manager.  Please call with any questions or concerns.  Thank you for the opportunity to participate in this pt's care.  Domenic Moras, BSN, RN [employer] [phone] (24h on call)

## 2020-12-09 NOTE — TOC Transition Note (Signed)
Transition of Care [facility]) - CM/SW Discharge Note   Patient Details  Name: Sheila [name] MRN: [id number] Date of Birth: Jun 21, 1946  [facility]) CM/SW Contact:  Trish Mage, LCSW Phone Number: 12/09/2020, 10:55 AM   Clinical Narrative:   Patient who is stable for d/c will return to [facility] today.  Family informed.  PTAR arranged.  Nursing, no report required as patient is in independent living.  TOC sign off.    Final next level of care: Assisted Living Barriers to Discharge: Barriers Resolved   Patient Goals and CMS Choice     Choice offered to / list presented to : [place]  Discharge Placement                       Discharge Plan and Services     Post Acute Care Choice: Skilled Nursing Facility                               Social Determinants of Health (SDOH) Interventions     Readmission Risk Interventions Readmission Risk Prevention Plan 05/15/2019  Post Dischage Appt Complete  Medication Screening Complete  Transportation Screening Complete

## 2020-12-09 NOTE — Plan of Care (Signed)
  Problem: Education: Goal: Knowledge of General Education information will improve Description: Including pain rating scale, medication(s)/side effects and non-pharmacologic comfort measures Outcome: Adequate for Discharge   Problem: Clinical Measurements: Goal: Will remain free from infection Outcome: Adequate for Discharge Goal: Diagnostic test results will improve Outcome: Adequate for Discharge   Problem: Activity: Goal: Risk for activity intolerance will decrease Outcome: Adequate for Discharge   Problem: Pain Managment: Goal: General experience of comfort will improve Outcome: Adequate for Discharge   Problem: Safety: Goal: Ability to remain free from injury will improve Outcome: Adequate for Discharge

## 2020-12-10 LAB — CULTURE, BLOOD (ROUTINE X 2)
Culture: NO GROWTH
Culture: NO GROWTH
Special Requests: ADEQUATE
Special Requests: ADEQUATE

## 2021-01-25 ENCOUNTER — Emergency Department (HOSPITAL_COMMUNITY)

## 2021-01-25 ENCOUNTER — Emergency Department (HOSPITAL_COMMUNITY)
Admission: EM | Admit: 2021-01-25 | Discharge: 2021-01-25 | Disposition: A | Discharge status: Home / Self Care | Attending: Emergency Medicine | Admitting: Emergency Medicine

## 2021-01-25 DIAGNOSIS — Z8616 Personal history of COVID-19: Secondary | ICD-10-CM | POA: Diagnosis not present

## 2021-01-25 DIAGNOSIS — E119 Type 2 diabetes mellitus without complications: Secondary | ICD-10-CM | POA: Insufficient documentation

## 2021-01-25 DIAGNOSIS — M79673 Pain in unspecified foot: Secondary | ICD-10-CM | POA: Insufficient documentation

## 2021-01-25 DIAGNOSIS — R1030 Lower abdominal pain, unspecified: Secondary | ICD-10-CM | POA: Diagnosis not present

## 2021-01-25 DIAGNOSIS — K219 Gastro-esophageal reflux disease without esophagitis: Secondary | ICD-10-CM | POA: Diagnosis not present

## 2021-01-25 DIAGNOSIS — K5900 Constipation, unspecified: Secondary | ICD-10-CM | POA: Insufficient documentation

## 2021-01-25 DIAGNOSIS — Z87891 Personal history of nicotine dependence: Secondary | ICD-10-CM | POA: Insufficient documentation

## 2021-01-25 DIAGNOSIS — R109 Unspecified abdominal pain: Secondary | ICD-10-CM | POA: Diagnosis present

## 2021-01-25 LAB — URINALYSIS, ROUTINE W REFLEX MICROSCOPIC
Bilirubin Urine: NEGATIVE
Glucose, UA: NEGATIVE mg/dL
Ketones, ur: NEGATIVE mg/dL
Nitrite: NEGATIVE
Protein, ur: 100 mg/dL — AB
Specific Gravity, Urine: 1.015 (ref 1.005–1.030)
WBC, UA: >50 WBC/hpf — ABNORMAL HIGH (ref 0–5)
pH: 6 (ref 5.0–8.0)

## 2021-01-25 MED ORDER — MORPHINE SULFATE 15 MG PO TABS
15.0000 mg | ORAL_TABLET | ORAL | Status: DC | PRN
  Administered 2021-01-25: 15 mg via ORAL
  Filled 2021-01-25: qty 1

## 2021-01-25 MED ORDER — FENTANYL CITRATE (PF) 100 MCG/2ML IJ SOLN
50.0000 ug | Freq: Once | INTRAMUSCULAR | Status: AC
Start: 2021-01-25 — End: 2021-01-25
  Administered 2021-01-25: 50 ug via INTRAVENOUS
  Filled 2021-01-25: qty 2

## 2021-01-25 NOTE — ED Notes (Signed)
Per ED provider and pt's daughter's wishes, pt to receive an additional dose of pain medication here and to be d/c'd. PTAR to be called for pt transport.

## 2021-01-25 NOTE — ED Notes (Signed)
ED provider at the bedside.  

## 2021-01-25 NOTE — Progress Notes (Signed)
[employer] [facility]) Hospital Liaison Note:  Sheila [name] is an [facility] patient at [facility] in independent living with her husband. Patient hospice terminal diagnosis is nonalcoholicsteatohepatitis and has a DNR on file with [facility].    [facility] RN was visiting patient this morning when patient complained of extreme (10 of 10) pain to lower abdominal area which started last night with no relief after taking tramadol for pain. Nurse noted skin was flushed, foley catheter was cloudy and that patient had not had a BM in several days. It was decided to send patient to [facility] ED for evaluation/treatment.    [facility] Liaison team will follow patient during this hospitalization and support as needed. Please feel free to call [facility] for any hospice related questions,  Thank you,  Gar Ponto, RN [facility] HLT [phone]

## 2021-01-25 NOTE — ED Triage Notes (Signed)
Pt presents to the ED via EMS from [facility] for abd pain. Pt has pan indwelling foley catheter, unsure when placed. Per EMS, there is concern from the pt's facility that the pt may have a UTI. Pt is a DNR and is under the care of hospice for liver failure.

## 2021-01-25 NOTE — ED Notes (Signed)
Pt to CT at this time.

## 2021-01-25 NOTE — ED Provider Notes (Signed)
[facility] DEPT Provider Note   CSN: [id number] Arrival date & time: 01/25/21  1042     History Chief Complaint  Patient presents with  . Abdominal Pain    Dayanira Giovannetti is a 75 y.o. female.  HPI    75 year old female with history of [name] leading to liver cirrhosis, CHF, diverticulitis, chronic indwelling Foley catheter and recent admission for UTI comes in with chief complaint of abdominal pain and leg pain.  Her symptoms started yesterday.  She is having lower quadrant abdominal pain that is sharp, fairly constant.  She denies any associated nausea, vomiting, fevers, chills, bloody stools.  She has no history of similar pain.  She has chronic indwelling Foley catheter and reports that in the past when she had UTI, she did not have abdominal pain like this.  Additionally, she reports that she is having severe pain in her feet that is also sharp.  No history of pain like this before  There is no history of paracentesis.  Patient is enrolled by hospice and resides at [facility].  Later inpatient stay, the daughter was at the bedside.  She informed me that hospice coordinator had already visited them and they would like the patient to be discharged as soon as possible to have at home, for hospice team to follow-up.  She wants [place] to focus on pain only.  Patient was okay with that suggestion.  Past Medical History:  Diagnosis Date  . Anemia   . Anxiety   . Cataract   . Depression   . Diabetes mellitus without complication ([place])   . Diverticulitis    Bled  . Esophageal varices ([place])   . Gallstones   . GERD (gastroesophageal reflux disease)   . Heart murmur   . Hx of migraines   . Hyperlipidemia   . Hypertension   . IC (interstitial cystitis)   . Irritable bowel syndrome   . NASH (nonalcoholic steatohepatitis)   . Obesity   . Pneumonia   . PVC (premature ventricular contraction)   . Vulva cancer [facility])     Patient Active Problem List    Diagnosis Date Noted  . Acute metabolic encephalopathy 28/78/6767  . UTI (urinary tract infection) 12/04/2020  . Colitis 12/04/2020  . Hypokalemia 10/23/2020  . COVID-19 virus infection 10/23/2020  . Type 2 diabetes mellitus ([place]) 10/23/2020  . Pressure injury of skin 02/11/2020  . Acute hepatic encephalopathy 02/09/2020  . Esophageal varices determined by endoscopy ([place]) 06/30/2019  . Portal hypertensive gastropathy ([place]) 06/30/2019  . Elevated troponin   . Demand ischemia ([place])   . Essential hypertension   . NSTEMI (non-ST elevated myocardial infarction) ([place]) 05/23/2019  . Chest pain 05/22/2019  . Hepatic encephalopathy ([place])   . Liver cirrhosis ([place])   . NASH (nonalcoholic steatohepatitis)   . Weakness 05/13/2019  . AKI (acute kidney injury) ([place]) 05/13/2019    Past Surgical History:  Procedure Laterality Date  . ABDOMINAL HYSTERECTOMY     total  . CHOLECYSTECTOMY    . COLONOSCOPY    . FOOT SURGERY Bilateral    Bunion  . HERNIA REPAIR       OB History   No obstetric history on file.     Family History  Adopted: Yes    Social History   Tobacco Use  . Smoking status: Former Smoker    Packs/day: 1.00    Years: 25.00    Pack years: 25.00    Types: Cigarettes    Quit  date: 2009    Years since quitting: 13.4  . Smokeless tobacco: Never Used  Vaping Use  . Vaping Use: Never used  Substance Use Topics  . Alcohol use: Never    Comment: rarely  . Drug use: Never    Home Medications Prior to Admission medications   Medication Sig Start Date End Date Taking? Authorizing Provider  ARIPiprazole (ABILIFY) 10 MG tablet Take 10 mg by mouth at bedtime.    [provider]  bethanechol (URECHOLINE) 5 MG tablet Take 5 mg by mouth 3 (three) times daily. 11/26/20   [provider]  buPROPion (WELLBUTRIN XL) 300 MG 24 hr tablet Take 300 mg by mouth every morning. 04/15/19   [provider]  Calcium-Vitamin D-Vitamin K (VIACTIV PO) Take 1 tablet  by mouth daily.    [provider]  carvedilol (COREG) 6.25 MG tablet Take 1 tablet (6.25 mg total) by mouth 2 (two) times daily with a meal. 12/09/20 01/08/21  Elodia Florence., MD  clobetasol cream (TEMOVATE) 4.56 % Apply 1 application topically daily as needed for rash. 11/01/16   [provider]  diclofenac sodium (VOLTAREN) 1 % GEL Apply 2 g topically 2 (two) times daily as needed for pain. 09/12/17   [provider]  ezetimibe (ZETIA) 10 MG tablet Take 10 mg by mouth daily. 08/05/18   [provider]  FLUoxetine (PROZAC) 40 MG capsule Take 40 mg by mouth every morning.    [provider]  lactulose (CHRONULAC) 10 GM/15ML solution Take 45 mLs (30 g total) by mouth 3 (three) times daily. Goal 2-3 BM's daily 12/09/20   Elodia Florence., MD  omeprazole (PRILOSEC) 40 MG capsule Take 40 mg by mouth every morning. 12/22/14   [provider]  ondansetron (ZOFRAN) 4 MG tablet Take 4 mg by mouth every 8 (eight) hours as needed for nausea or vomiting.    [provider]  Polyethyl Glycol-Propyl Glycol (SYSTANE) 0.4-0.3 % SOLN Place 1 drop into both eyes 2 (two) times daily.    [provider]    Allergies    Acetaminophen, Cefazolin, Ciprofloxacin hcl, Ibuprofen, Sulfa antibiotics, Naproxen sodium, and Amoxicillin  Review of Systems   Review of Systems  Constitutional: Positive for activity change.  Respiratory: Negative for shortness of breath.   Cardiovascular: Negative for chest pain.  Gastrointestinal: Positive for abdominal pain. Negative for nausea and vomiting.  Musculoskeletal: Positive for myalgias.  Skin: Negative for rash.  Allergic/Immunologic: Positive for immunocompromised state.    Physical Exam Updated Vital Signs BP (!) 155/78   Pulse 79   Temp 98.2 F (36.8 C) (Oral)   Resp 18   Ht 5' 5"  (1.651 m)   Wt 79.3 kg   SpO2 98%   BMI 29.09 kg/m   Physical Exam Vitals and nursing note reviewed.   Constitutional:      Appearance: She is well-developed.  HENT:     Head: Normocephalic and atraumatic.  Eyes:     Pupils: Pupils are equal, round, and reactive to light.  Cardiovascular:     Rate and Rhythm: Normal rate and regular rhythm.     Heart sounds: Normal heart sounds. No murmur heard.   Pulmonary:     Effort: Pulmonary effort is normal. No respiratory distress.  Abdominal:     General: There is no distension.     Palpations: Abdomen is soft.     Tenderness: There is abdominal tenderness in the left lower quadrant. There is  no guarding or rebound.  Musculoskeletal:     Cervical back: Neck supple.  Skin:    General: Skin is warm and dry.     Findings: No rash.  Neurological:     Mental Status: She is alert and oriented to person, place, and time.     ED Results / Procedures / Treatments   Labs (all labs ordered are listed, but only abnormal results are displayed) Labs Reviewed  URINALYSIS, ROUTINE W REFLEX MICROSCOPIC - Abnormal; Notable for the following components:      Result Value   Color, Urine AMBER (*)    APPearance CLOUDY (*)    Hgb urine dipstick SMALL (*)    Protein, ur 100 (*)    Leukocytes,Ua LARGE (*)    WBC, UA >50 (*)    Bacteria, UA MANY (*)    All other components within normal limits  URINE CULTURE  COMPREHENSIVE METABOLIC PANEL  CBC WITH DIFFERENTIAL/PLATELET  PROTIME-INR    EKG None  Radiology CT ABDOMEN PELVIS WO CONTRAST  Result Date: 01/25/2021 CLINICAL DATA:  Acute generalized abdominal pain. EXAM: CT ABDOMEN AND PELVIS WITHOUT CONTRAST TECHNIQUE: Multidetector CT imaging of the abdomen and pelvis was performed following the standard protocol without IV contrast. COMPARISON:  December 04, 2020. FINDINGS: Lower chest: No acute abnormality. Hepatobiliary: Status post cholecystectomy. No biliary dilatation is noted. Hepatic cirrhosis is noted. No definite focal hepatic abnormality is noted on these unenhanced images. Pancreas:  Unremarkable. No pancreatic ductal dilatation or surrounding inflammatory changes. Spleen: Normal in size without focal abnormality. Adrenals/Urinary Tract: Adrenal glands appear normal. Bilateral renal cysts are noted. No hydronephrosis or renal obstruction is noted. No renal or ureteral calculi are noted. Urinary bladder is decompressed secondary to Foley catheter. Stomach/Bowel: Stomach appears normal. No small bowel dilatation is noted. Stool seen throughout the colon. Large amount of stool is noted in the rectum concerning for impaction. Stable mild wall thickening of the right colon is noted which may be related to portal hypertension, although infectious or inflammatory colitis cannot be excluded. Vascular/Lymphatic: Aortic atherosclerosis. No enlarged abdominal or pelvic lymph nodes. Reproductive: Status post hysterectomy. No adnexal masses. Other: No abdominal wall hernia or abnormality. No abdominopelvic ascites. Musculoskeletal: No acute or significant osseous findings. IMPRESSION: Hepatic cirrhosis. Stable bilateral renal cysts. Large amount of stool seen throughout the colon, particularly in the rectum which is concerning for impaction. Stable mild wall thickening of the right colon is noted which may be related to portal hypertension, although infectious or inflammatory colitis cannot be excluded. Aortic Atherosclerosis (ICD10-I70.0). Electronically Signed   By: Marijo Conception M.D.   On: 01/25/2021 13:17    Procedures Procedures   Medications Ordered in ED Medications  morphine (MSIR) tablet 15 mg (has no administration in time range)  fentaNYL (SUBLIMAZE) injection 50 mcg (50 mcg Intravenous Given 01/25/21 1210)    ED Course  I have reviewed the triage vital signs and the nursing notes.  Pertinent labs & imaging results that were available during my care of the patient were reviewed by me and considered in my medical decision making (see chart for details).    MDM  Rules/Calculators/A&P                          75 year old comes in a chief complaint of abdominal pain and feet pain.  Ms. Maybell has history of liver cirrhosis secondary to [facility], diverticulitis, diabetes and chronic indwelling Foley catheter.  On exam, she  is noted to have lower quadrant abdominal tenderness without any rebound or guarding.  CT scan ordered to rule out diverticulitis, as the patient was stating that her pain was not similar to her UTI.  It is showing high-grade constipation.  Enema was ordered, patient's family declined the enema, stating that they would want hospice to take over.  Patient states that she does not feel significantly constipated, and is comfortable with hospice managing her constipation, which is all reasonable.  Additionally, family wants hospice to manage her other symptoms including pain.  We discussed patient's concerns for UTI, ultimately they have decided to have hospice check her for UA as well.  We did get a urine sample here, but it was straight from the bag and not a reliable source for determining if there is UTI.  Pretest probability is low for UTI, some comfortable with hospice managing her.  She is not having any fevers.  Labs were also declined by family and patient.  Final Clinical Impression(s) / ED Diagnoses Final diagnoses:  Constipation, unspecified constipation type  Lower abdominal pain    Rx / DC Orders ED Discharge Orders    None       Varney Biles, MD 01/25/21 1524

## 2021-01-25 NOTE — ED Notes (Signed)
PTAR here to take pt

## 2021-01-25 NOTE — Discharge Instructions (Addendum)
You were seen in the ER for abdominal pain and feet pain.  We would like the hospice team to check your urine, to see if there is any urine infection.   The CT scan here did show significant constipation.  If lactulose is not working like it should, you might need further help with constipation. We had ordered enema, but canceled it at the request of family.

## 2021-01-25 NOTE — ED Notes (Signed)
Pt's daughter is at the bedside and stated to this nurse that the pt is under the care of hospice. Per the pt's daughter, after speaking with the pt's "hospice team" daughter wanted pt to receive pain control in the ED for her abd pain, and potentially be treated for a UTI and discharged. Both pt and daughter decline soap suds enema at this time. Dr. Kathrynn Humble notified and aware. Provider to speak with pt and daughter.

## 2021-01-25 NOTE — ED Notes (Signed)
Family at bedside. 

## 2021-01-25 NOTE — ED Notes (Signed)
PTAR called for transport.  

## 2021-01-28 LAB — URINE CULTURE: Culture: >=100000 — AB

## 2021-01-29 ENCOUNTER — Telehealth: Payer: Self-pay | Admitting: Emergency Medicine

## 2021-01-29 NOTE — Telephone Encounter (Signed)
Post ED Visit - Positive Culture Follow-up  Culture report reviewed by antimicrobial stewardship pharmacist: [place] Team []  Nathan Batchelder, [address].D. []  [place], Pharm.D., BCPS AQ-ID []  Heide Guile, Pharm.D., BCPS []  Parks Neptune, Pharm.D., BCPS []  [place], Pharm.D., BCPS, AAHIVP []  [place], Pharm.D., BCPS, AAHIVP []  Legrand Como, PharmD, BCPS []  Salome Arnt, PharmD, BCPS []  Johnnette Gourd, PharmD, BCPS []  Hughes Better, PharmD []  Leeroy Cha, PharmD, BCPS []  Laqueta [place], PharmD  [place] Team []  [address], PharmD []  Leodis Sias, PharmD []  Lindell Spar, PharmD []  Royetta Asal, Rph []  Graylin Shiver) Rema Fendt, PharmD []  Glennon Mac, PharmD []  Arlyn Dunning, PharmD []  Netta Cedars, PharmD []  Dia Sitter, PharmD []  Leone Haven, PharmD []  Gretta Arab, PharmD []  Theodis Shove, PharmD [x]  Peggyann Juba, PharmD   Positive urine culture No further patient follow-up is required at this time.  Reuel Boom Malaquias Lenker 01/29/2021, 2:22 PM

## 2021-04-04 DEATH — deceased

## 2021-05-05 DEATH — deceased
